# Patient Record
Sex: Female | Born: 1955 | Race: Black or African American | Hispanic: No | Marital: Single | Smoking: Never smoker
Health system: Southern US, Community
[De-identification: ages and names within clinical notes are randomized; demographics above are authoritative.]

## PROBLEM LIST (undated history)

## (undated) DIAGNOSIS — E785 Hyperlipidemia, unspecified: Secondary | ICD-10-CM

## (undated) DIAGNOSIS — I2510 Atherosclerotic heart disease of native coronary artery without angina pectoris: Secondary | ICD-10-CM

## (undated) DIAGNOSIS — Z923 Personal history of irradiation: Secondary | ICD-10-CM

## (undated) DIAGNOSIS — I639 Cerebral infarction, unspecified: Secondary | ICD-10-CM

## (undated) DIAGNOSIS — N950 Postmenopausal bleeding: Secondary | ICD-10-CM

## (undated) DIAGNOSIS — E119 Type 2 diabetes mellitus without complications: Secondary | ICD-10-CM

## (undated) DIAGNOSIS — Z955 Presence of coronary angioplasty implant and graft: Secondary | ICD-10-CM

## (undated) DIAGNOSIS — I10 Essential (primary) hypertension: Secondary | ICD-10-CM

## (undated) DIAGNOSIS — C50919 Malignant neoplasm of unspecified site of unspecified female breast: Secondary | ICD-10-CM

## (undated) HISTORY — DX: Type 2 diabetes mellitus without complications: E11.9

## (undated) HISTORY — DX: Hyperlipidemia, unspecified: E78.5

## (undated) HISTORY — DX: Personal history of irradiation: Z92.3

## (undated) HISTORY — DX: Essential (primary) hypertension: I10

## (undated) HISTORY — DX: Cerebral infarction, unspecified: I63.9

## (undated) HISTORY — PX: CORONARY ANGIOPLASTY WITH STENT PLACEMENT: SHX49

## (undated) HISTORY — PX: COLONOSCOPY: SHX174

---

## 1992-11-29 HISTORY — PX: DILATION AND CURETTAGE OF UTERUS: SHX78

## 2001-04-20 ENCOUNTER — Other Ambulatory Visit: Admission: RE | Admit: 2001-04-20 | Discharge: 2001-04-20 | Payer: Self-pay | Admitting: Obstetrics and Gynecology

## 2003-05-06 ENCOUNTER — Encounter: Payer: Self-pay | Admitting: Internal Medicine

## 2003-05-06 ENCOUNTER — Encounter: Admission: RE | Admit: 2003-05-06 | Discharge: 2003-05-06 | Payer: Self-pay | Admitting: Internal Medicine

## 2005-11-23 ENCOUNTER — Encounter: Admission: RE | Admit: 2005-11-23 | Discharge: 2005-11-23 | Payer: Self-pay | Admitting: Internal Medicine

## 2013-06-02 ENCOUNTER — Ambulatory Visit (INDEPENDENT_AMBULATORY_CARE_PROVIDER_SITE_OTHER): Payer: BC Managed Care – HMO | Admitting: Family Medicine

## 2013-06-02 ENCOUNTER — Ambulatory Visit: Payer: BC Managed Care – HMO

## 2013-06-02 DIAGNOSIS — M79609 Pain in unspecified limb: Secondary | ICD-10-CM

## 2013-06-02 DIAGNOSIS — S63619A Unspecified sprain of unspecified finger, initial encounter: Secondary | ICD-10-CM

## 2013-06-02 DIAGNOSIS — I10 Essential (primary) hypertension: Secondary | ICD-10-CM | POA: Insufficient documentation

## 2013-06-02 DIAGNOSIS — S6390XA Sprain of unspecified part of unspecified wrist and hand, initial encounter: Secondary | ICD-10-CM

## 2013-06-02 NOTE — Progress Notes (Signed)
Patient ID: Audrey Fischer MRN: [id number], DOB: [birth year], 57 y.o. Date of Encounter: 06/02/2013, 7:04 PM  Primary Physician: Nadean Corwin, MD  Chief Complaint: Jammed right 5th finger today  HPI: 57 y.o. female with history below presents with right 5th digit pain. Patient was trying to get a box of plastic silverware out of the pantry and it started to fall. Upon trying to catch this she jammed her 5th digit into the cardboard box. Her daughter thinking it was jammed tried to relocate the digit. She comes in with pain at the DIP. No STS, erythema, or bruising. She is right handed.    Past Medical History  Diagnosis Date  . Heart disease   . Hypertension   . Hyperlipidemia      Home Meds: Prior to Admission medications   Medication Sig Start Date End Date Taking? Authorizing Provider  atenolol (TENORMIN) 50 MG tablet Take 50 mg by mouth daily.   Yes Historical Provider, MD  rosuvastatin (CRESTOR) 5 MG tablet Take 5 mg by mouth daily.   Yes Historical Provider, MD    Allergies: No Known Allergies  History   Social History  . Marital Status: Single    Spouse Name: N/A    Number of Children: N/A  . Years of Education: N/A   Occupational History  . Not on file.   Social History Main Topics  . Smoking status: Never Smoker   . Smokeless tobacco: Not on file  . Alcohol Use: No  . Drug Use: No  . Sexually Active: No   Other Topics Concern  . Not on file   Social History Narrative  . No narrative on file     Review of Systems: Constitutional: negative for chills, fever, or fatigue  Cardiovascular: negative for chest pain or palpitations Respiratory: negative for hemoptysis, wheezing, shortness of breath, or cough Dermatological: negative for rash Musculoskeletal: see above Neurologic: negative for headache   Physical Exam: Blood pressure 166/104, pulse 74, temperature 97.9 F (36.6 C), temperature source Oral, resp. rate 18, height 5' 8.5" (1.74 m),  weight 209 lb 12.8 oz (95.165 kg), SpO2 99.00%., Body mass index is 31.43 kg/(m^2). General: Well developed, well nourished, in no acute distress. Head: Normocephalic, atraumatic, eyes without discharge, sclera non-icteric, nares are without discharge.  Neck: Supple. Full ROM. Lungs: Clear bilaterally to auscultation without wheezes, rales, or rhonchi. Breathing is unlabored. Heart: RRR with S1 S2. No murmurs, rubs, or gallops appreciated. Msk:  Strength and tone normal for age. Upon entering the room the right 5th digit is splinted with masking tape and a green highlighter in full extension. This was removed for my initial exam. No TTP of the wrist or hand. No TTP of the MCP. No TTP of the proximal 5th digit. No TTP of the PIP. No TTP from the PIP distally to the DIP. Mild TTP of the DIP. FROM of the MCP joint and PIP joint. Unable to fully assess the DIP joint secondary to pain, although flexion is intact. No erythema, ecchymosis, or STS.  Extremities/Skin: Warm and dry. No clubbing or cyanosis. No edema. No rashes or suspicious lesions. Neuro: Alert and oriented X 3. Moves all extremities spontaneously. Gait is normal. CNII-XII grossly in tact. Psych:  Responds to questions appropriately with a normal affect.   [facility] reading (PRIMARY) by  Dr. Patsy Lager. Right 5th digit: Negative   ASSESSMENT AND PLAN:  57 y.o. female with right 5th digit finger sprain and hypertension  1) Right 5th digit  finger sprain -Metal fold over splint -Advance ROM as tolerated within the next 5 days -Motrin prn  2) Hypertension -Usually well controlled -She has not yet taken her antihypertensive medication today, she is also in considerable pain and stress from her injury accounting for the elevation in her blood pressure. I have advised her go home and take her medication and check her blood pressure with her cuff shortly after over the next several days. She already has a follow up appointment with her PCP. If her  BP remains elevated, call their office and see if she can move this up sooner.  -RTC/ER precautions   Signed, Eula Listen, PA-C 06/02/2013 7:04 PM

## 2013-11-28 ENCOUNTER — Other Ambulatory Visit: Payer: Self-pay | Admitting: Internal Medicine

## 2013-12-04 NOTE — Telephone Encounter (Signed)
TRY TO CALL PT TODAY=12/04/2013 AT [phone]=LEFT MESSAGE FOR PT TO CALL THE OFFICE TO MAKE AN APPT=ALSO CALLED [phone]-NA= AND [phone] NO RING TONE.    ALSO CALLED [facility] AT [address] AT [phone] TOLD CRYSTAL THAT IF PT CALLS SHE NEEDS AND OV 1ST BEFORE ANY REFILLS.JSI

## 2013-12-25 NOTE — Telephone Encounter (Signed)
Pt never responded? At this point do we close pt's chart?   SB

## 2015-05-26 DIAGNOSIS — E7800 Pure hypercholesterolemia, unspecified: Secondary | ICD-10-CM | POA: Insufficient documentation

## 2015-07-02 ENCOUNTER — Encounter: Payer: Self-pay | Admitting: Gastroenterology

## 2015-07-02 ENCOUNTER — Other Ambulatory Visit: Payer: Self-pay | Admitting: Obstetrics and Gynecology

## 2015-07-02 DIAGNOSIS — N6310 Unspecified lump in the right breast, unspecified quadrant: Secondary | ICD-10-CM

## 2015-07-02 DIAGNOSIS — N6320 Unspecified lump in the left breast, unspecified quadrant: Secondary | ICD-10-CM

## 2015-07-07 ENCOUNTER — Other Ambulatory Visit: Payer: Self-pay | Admitting: Obstetrics and Gynecology

## 2015-07-07 ENCOUNTER — Ambulatory Visit
Admission: RE | Admit: 2015-07-07 | Discharge: 2015-07-07 | Disposition: A | Discharge status: Home / Self Care | Payer: BC Managed Care – PPO | Source: Ambulatory Visit | Attending: Obstetrics and Gynecology | Admitting: Obstetrics and Gynecology

## 2015-07-07 ENCOUNTER — Ambulatory Visit
Admission: RE | Admit: 2015-07-07 | Discharge: 2015-07-07 | Disposition: A | Discharge status: Home / Self Care | Payer: Self-pay | Source: Ambulatory Visit | Attending: Obstetrics and Gynecology | Admitting: Obstetrics and Gynecology

## 2015-07-07 DIAGNOSIS — N6320 Unspecified lump in the left breast, unspecified quadrant: Secondary | ICD-10-CM

## 2015-07-07 DIAGNOSIS — N6310 Unspecified lump in the right breast, unspecified quadrant: Secondary | ICD-10-CM

## 2015-07-08 ENCOUNTER — Other Ambulatory Visit: Payer: Self-pay | Admitting: Obstetrics and Gynecology

## 2015-07-08 DIAGNOSIS — N6320 Unspecified lump in the left breast, unspecified quadrant: Secondary | ICD-10-CM

## 2015-07-09 ENCOUNTER — Ambulatory Visit
Admission: RE | Admit: 2015-07-09 | Discharge: 2015-07-09 | Disposition: A | Discharge status: Home / Self Care | Payer: BC Managed Care – PPO | Source: Ambulatory Visit | Attending: Obstetrics and Gynecology | Admitting: Obstetrics and Gynecology

## 2015-07-09 DIAGNOSIS — N6320 Unspecified lump in the left breast, unspecified quadrant: Secondary | ICD-10-CM

## 2015-07-09 DIAGNOSIS — C50919 Malignant neoplasm of unspecified site of unspecified female breast: Secondary | ICD-10-CM

## 2015-07-09 HISTORY — DX: Malignant neoplasm of unspecified site of unspecified female breast: C50.919

## 2015-07-17 ENCOUNTER — Telehealth: Payer: Self-pay | Admitting: *Deleted

## 2015-07-17 NOTE — Telephone Encounter (Signed)
Received referral from [facility].  Called and left a message for the pt to return my call so I can schedule a med onc appt w/ her.  

## 2015-07-18 ENCOUNTER — Encounter: Payer: Self-pay | Admitting: Internal Medicine

## 2015-07-18 ENCOUNTER — Ambulatory Visit (AMBULATORY_SURGERY_CENTER): Payer: Self-pay

## 2015-07-18 VITALS — Ht 68.5 in | Wt 201.6 lb

## 2015-07-18 DIAGNOSIS — Z800 Family history of malignant neoplasm of digestive organs: Secondary | ICD-10-CM

## 2015-07-18 MED ORDER — SUPREP BOWEL PREP KIT 17.5-3.13-1.6 GM/177ML PO SOLN: 1.0000 | Freq: Once | ORAL | Status: DC

## 2015-07-18 NOTE — Progress Notes (Signed)
No allergies to eggs or soy No home oxygen No diet/weight loss meds No past problems with anesthesia EXCEPT VERY SENSITIVE  Has email  Emmi instructions given for colonscopy

## 2015-07-22 ENCOUNTER — Encounter: Payer: Self-pay | Admitting: Radiation Oncology

## 2015-07-22 ENCOUNTER — Telehealth: Payer: Self-pay | Admitting: *Deleted

## 2015-07-22 NOTE — Progress Notes (Addendum)
Location of Breast Cancer:Left Breast  7   0'clock  Position [place]  Histology per Pathology Report: Diagnosis 07/09/15: Breast, left, needle core biopsy, 7:00 o'clock subareolar - INVASIVE DUCTAL CARCINOMA.  Receptor Status: ER(+100%)  PR (+100%), Her2-neu ( neg, KI-67 10%)  Did patient present with symptoms (if so, please note symptoms) or was this found on screening mammography?: Routine mammogram screening  Past/Anticipated interventions by surgeon, if any: Dr. Daiva Nakayama ,MD Colonoscopy scheduled 07/28/15  With [name]  07/16/15 had U/S vaginal done by Dr. Leo Grosser, found cyst on her ovary  U/S Past/Anticipated interventions by medical oncology, if any: Chemotherapy none as yet,patient will call and schedule she started back to work   Lymphedema issues, if any:  NO  Pain issues, if any:  NO  SAFETY ISSUES:NO  Prior radiation? NO  Pacemaker/ICD? NO  Possible current pregnancy? NO  Is the patient on methotrexate? NO  Current Complaints / other details:  Single , menarche age 13G3P2,oral contraceptives, No HRT HX cardiac stent 24-Feb-2008  ,Mother deceased had colon cancer ,stroke,heart disease, HTN,, Father deceased heart disease  HTN Never smoker,occsional alcohol use,no drug use  Audrey Fischer, Audrey Gage, RN 07/22/2015,9:18 AM

## 2015-07-22 NOTE — Telephone Encounter (Signed)
Called pt at home and got no answer no machine.  Called & left a message on pt's cell to return my call so I can schedule a med onc appt w/ her.

## 2015-07-23 ENCOUNTER — Encounter: Payer: Self-pay | Admitting: Radiation Oncology

## 2015-07-23 ENCOUNTER — Ambulatory Visit
Admission: RE | Admit: 2015-07-23 | Discharge: 2015-07-23 | Disposition: A | Discharge status: Home / Self Care | Payer: BC Managed Care – PPO | Source: Ambulatory Visit | Attending: Radiation Oncology | Admitting: Radiation Oncology

## 2015-07-23 VITALS — BP 183/103 | HR 107 | Temp 97.8°F | Resp 20 | Ht 68.5 in | Wt 205.5 lb

## 2015-07-23 DIAGNOSIS — C50019 Malignant neoplasm of nipple and areola, unspecified female breast: Secondary | ICD-10-CM | POA: Diagnosis not present

## 2015-07-23 DIAGNOSIS — Z955 Presence of coronary angioplasty implant and graft: Secondary | ICD-10-CM | POA: Diagnosis not present

## 2015-07-23 DIAGNOSIS — C50112 Malignant neoplasm of central portion of left female breast: Secondary | ICD-10-CM | POA: Insufficient documentation

## 2015-07-23 DIAGNOSIS — Z79899 Other long term (current) drug therapy: Secondary | ICD-10-CM | POA: Diagnosis not present

## 2015-07-23 DIAGNOSIS — C50012 Malignant neoplasm of nipple and areola, left female breast: Secondary | ICD-10-CM

## 2015-07-23 DIAGNOSIS — Z7982 Long term (current) use of aspirin: Secondary | ICD-10-CM | POA: Diagnosis not present

## 2015-07-23 DIAGNOSIS — I10 Essential (primary) hypertension: Secondary | ICD-10-CM | POA: Insufficient documentation

## 2015-07-23 DIAGNOSIS — E785 Hyperlipidemia, unspecified: Secondary | ICD-10-CM | POA: Insufficient documentation

## 2015-07-23 HISTORY — DX: Malignant neoplasm of unspecified site of unspecified female breast: C50.919

## 2015-07-23 NOTE — Progress Notes (Signed)
Please see the Nurse Progress Note in the MD Initial Consult Encounter for this patient. 

## 2015-07-23 NOTE — Progress Notes (Signed)
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 A diagnosis of Cancer of central portion of left breast was also pertinent to this visit. 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 female who is seen for an initial consultation visit. We are asked to see the patient in consultation by Dr. Marlou Starks to evaluate her for a left breast cancer. The patient is a 59 yo black female who recently went for a routine screening mammogram. At that time she was found to have a 1.3 cm mass in the retroareolar position. This was biopsied and came back as an invasive ductal cancer. She was ER and PR positive and HER-2 negative with a Ki-67 of 10%. She denies any personal or family history of breast cancer. She does not take any hormone replacement. She does have a history of cardiac stents placed in 2009. She is only on a baby aspirin.   She has a colonoscopy scheduled for Monday, 8/29, with Zenovia Jarred. She is not currently scheduled to follow up with medical oncology but plans to do so soon.   PREVIOUS RADIATION THERAPY: No   PAST MEDICAL HISTORY:  has a past medical history of Heart disease; Hypertension; Hyperlipidemia; Breast cancer (07/09/15); and Allergy.     PAST SURGICAL HISTORY: Past Surgical History  Procedure Laterality Date  . Carotid stent  2009    x2 stents at this encounter  . Cesarean section    . Ovum / oocyte retrieval       FAMILY HISTORY: family history includes Colon cancer (age of onset: 64) in her mother; Heart disease in her father and mother; Stroke in her mother.   SOCIAL HISTORY:  reports that she has  never smoked. She has never used smokeless tobacco. She reports that she drinks alcohol. She reports that she does not use illicit drugs.   ALLERGIES: Capsule   MEDICATIONS:  Current Outpatient Prescriptions  Medication Sig Dispense Refill  . aspirin 81 MG tablet Take 81 mg by mouth daily.    Marland Kitchen atenolol (TENORMIN) 50 MG tablet Take 50 mg by mouth daily.    . rosuvastatin (CRESTOR) 5 MG tablet Take 5 mg by mouth daily.    . nitroGLYCERIN (NITROSTAT) 0.4 MG SL tablet Place 0.4 mg under the tongue.    Marland Kitchen SUPREP BOWEL PREP SOLN Take 1 kit by mouth once. (Patient not taking: Reported on 07/23/2015) 354 mL 0   No current facility-administered medications for this encounter.     REVIEW OF SYSTEMS:  A 15 point review of systems is documented in the electronic medical record. This was obtained by the nursing staff. However, I reviewed this with the patient to discuss relevant findings and make appropriate changes.  Pertinent items are noted in HPI.    PHYSICAL EXAM:  height is 5' 8.5" (1.74 m) and weight is 205 lb 8 oz (93.214 kg). Her oral temperature is 97.8 F (36.6 C). Her blood pressure is 183/103 and her pulse is 107. Her respiration is 20.   General: Well-developed, in no acute distress HEENT: Normocephalic, atraumatic; oral cavity clear Neck: Supple without any  lymphadenopathy Cardiovascular: Regular rate and rhythm Respiratory: Clear to auscultation bilaterally GI: Soft, nontender, normal bowel sounds Extremities: No edema present Neuro: No focal deficits  Small biopsy site laterally within the left breast. Small palpable mass in the retroareolar region on the left without any axillary lymph nodes. Unremarkable right breast exam without any axillary lymph nodes. ECOG = 0  0 - Asymptomatic (Fully active, able to carry on all predisease activities without restriction)  1 - Symptomatic but completely ambulatory (Restricted in physically strenuous activity but ambulatory and able to  carry out work of a light or sedentary nature. For example, light housework, office work)  2 - Symptomatic, <50% in bed during the day (Ambulatory and capable of all self care but unable to carry out any work activities. Up and about more than 50% of waking hours)  3 - Symptomatic, >50% in bed, but not bedbound (Capable of only limited self-care, confined to bed or chair 50% or more of waking hours)  4 - Bedbound (Completely disabled. Cannot carry on any self-care. Totally confined to bed or chair)  5 - Death   Eustace Pen MM, Creech RH, Tormey DC, et al. [phone]). "Toxicity and response criteria of the [facility] Group". [place] Oncol. 5 (6): 649-55     LABORATORY DATA:  No results found for: WBC, HGB, HCT, MCV, PLT No results found for: NA, K, CL, CO2 No results found for: ALT, AST, GGT, ALKPHOS, BILITOT    RADIOGRAPHY: Mm Digital Diagnostic Unilat L  07/09/2015   CLINICAL DATA:  Status post ultrasound-guided core biopsy of the left breast mass.  EXAM: DIAGNOSTIC LEFT MAMMOGRAM POST ULTRASOUND BIOPSY  COMPARISON:  Previous exam(s).  FINDINGS: Mammographic images were obtained following ultrasound guided biopsy of a mass in the 7 o'clock subareolar region of the left breast. Mammographic images show there is a ribbon shaped clip associated with the mass in the 7 o'clock subareolar region of the left breast.  IMPRESSION: Status post ultrasound-guided core biopsy of the left breast with pathology pending.  Final Assessment: Post Procedure Mammograms for Marker Placement   Electronically Signed   By: Lillia Mountain M.D.   On: 07/09/2015 11:16   US Breast Ltd [name] Left Inc Axilla  07/16/2015   ADDENDUM REPORT: 07/10/2015 14:01  ADDENDUM: Pathology reveals Grade I invasive ductal carcinoma of the left breast. This was found to be concordant by Dr. Lillia Mountain. Pathology results were discussed with the patient via telephone. The patient reported no problems with the biopsy site and is  doing well. Post biopsy instructions were reviewed and questions were answered. The patient was encouraged to call [facility] with any additional questions and or concerns. The patient was referred to Dr. Autumn Messing of [facility] on July 16, 2015.  Pathology results reported by Terie Purser RN on July 10, 2015.   Electronically Signed   By: Lillia Mountain M.D.   On: 07/10/2015 14:01   07/16/2015   CLINICAL DATA:  Mass in each breast on a recent screening mammogram at [facility].  EXAM: DIGITAL DIAGNOSTIC BILATERAL MAMMOGRAM WITH 3D TOMOSYNTHESIS WITH CAD  ULTRASOUND BILATERAL BREAST  COMPARISON:  Previous examinations, including the screening mammogram at [facility] on 07/02/2015 and previous mammogram at the [facility] on 11/23/2005.  ACR Breast Density Category c: The breast tissue is heterogeneously dense, which may obscure small masses.  FINDINGS: Spot tomographic images of the right breast confirm  a small, rounded, circumscribed mass with some associated fat density in the anterior aspect of the lower outer quadrant of the breast.  Spot tomographic images of the left breast confirm an oval, irregular, spiculated mass in the lower inner retroareolar region.  Mammographic images were processed with CAD.  On physical exam, no mass is palpable in the inferior right breast or left axilla. There is an approximately 1.2 cm oval, firm, palpable mass in the lower inner retroareolar left breast, in the 7 o'clock position, immediately adjacent to the nipple.  Targeted ultrasound is performed, showing three small, oval, circumscribed, hypoechoic masses containing some echogenic material in the 7 o'clock periareolar region of the right breast, 1 cm from the nipple. The ultrasound images are incorrectly labeled 5 o'clock. The largest measures 4 mm in maximum diameter and contains a thin internal septation.  Ultrasound of the left  breast demonstrates a 1.3 x 1.3 x 1.0 cm oval, horizontally oriented, hypoechoic mass with irregular margins in the 7 o'clock retroareolar region. This is extending into the overlying skin of the areola and into the inferior aspect of the nipple.  Ultrasound of the left axilla demonstrated normal appearing left axillary lymph nodes.  IMPRESSION: 1. 1.3 cm mass with imaging features highly suspicious for malignancy in the 7 o'clock retroareolar left breast, involving the areola and adjacent nipple. 2. Oil cysts 7 o'clock position of the right breast.  RECOMMENDATION: Ultrasound-guided core needle biopsy of the 1.3 cm 7 o'clock retroareolar left breast mass. This has been discussed with the patient and scheduled at 9 a.m. on 07/09/2015.  I have discussed the findings and recommendations with the patient. Results were also provided in writing at the conclusion of the visit. If applicable, a reminder letter will be sent to the patient regarding the next appointment.  BI-RADS CATEGORY  5: Highly suggestive of malignancy.  Electronically Signed: By: Claudie Revering M.D. 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 This was found to be concordant by Dr. Lillia Mountain. Pathology results were discussed with the patient via telephone. The patient reported no problems with the biopsy site and is doing well. Post biopsy instructions were reviewed and questions were answered. The patient was encouraged to call [facility] with any additional questions and or concerns. The patient was referred to Dr. Autumn Messing of [facility] on July 16, 2015.  Pathology results reported by Terie Purser RN on July 10, 2015.   Electronically Signed   By: Lillia Mountain M.D.   On: 07/10/2015 14:01   07/16/2015   CLINICAL DATA:  Mass in each breast on a recent screening  mammogram at [facility].  EXAM: DIGITAL DIAGNOSTIC BILATERAL MAMMOGRAM WITH 3D TOMOSYNTHESIS WITH CAD  ULTRASOUND BILATERAL BREAST  COMPARISON:  Previous examinations, including the screening mammogram at [facility] on 07/02/2015 and previous mammogram at the [place] on 11/23/2005.  ACR Breast Density Category c: The breast tissue is heterogeneously dense, which may obscure small masses.  FINDINGS: Spot tomographic images of the right breast confirm a small, rounded, circumscribed mass with some associated fat density in the anterior aspect of the lower outer quadrant of the breast.  Spot tomographic images of the left breast confirm an oval, irregular, spiculated mass in the lower inner retroareolar region.  Mammographic images were  processed with CAD.  On physical exam, no mass is palpable in the inferior right breast or left axilla. There is an approximately 1.2 cm oval, firm, palpable mass in the lower inner retroareolar left breast, in the 7 o'clock position, immediately adjacent to the nipple.  Targeted ultrasound is performed, showing three small, oval, circumscribed, hypoechoic masses containing some echogenic material in the 7 o'clock periareolar region of the right breast, 1 cm from the nipple. The ultrasound images are incorrectly labeled 5 o'clock. The largest measures 4 mm in maximum diameter and contains a thin internal septation.  Ultrasound of the left breast demonstrates a 1.3 x 1.3 x 1.0 cm oval, horizontally oriented, hypoechoic mass with irregular margins in the 7 o'clock retroareolar region. This is extending into the overlying skin of the areola and into the inferior aspect of the nipple.  Ultrasound of the left axilla demonstrated normal appearing left axillary lymph nodes.  IMPRESSION: 1. 1.3 cm mass with imaging features highly suspicious for malignancy in the 7 o'clock retroareolar left breast, involving the areola and adjacent nipple. 2.  Oil cysts 7 o'clock position of the right breast.  RECOMMENDATION: Ultrasound-guided core needle biopsy of the 1.3 cm 7 o'clock retroareolar left breast mass. This has been discussed with the patient and scheduled at 9 a.m. on 07/09/2015.  I have discussed the findings and recommendations with the patient. Results were also provided in writing at the conclusion of the visit. If applicable, a reminder letter will be sent to the patient regarding the next appointment.  BI-RADS CATEGORY  5: Highly suggestive of malignancy.  Electronically Signed: By: Claudie Revering M.D. On: 07/07/2015 13:09   Mm Diag Breast Tomo Bilateral  07/16/2015   ADDENDUM REPORT: 07/10/2015 14:01  ADDENDUM: Pathology reveals Grade I invasive ductal carcinoma of the left breast. This was found to be concordant by Dr. Lillia Mountain. Pathology results were discussed with the patient via telephone. The patient reported no problems with the biopsy site and is doing well. Post biopsy instructions were reviewed and questions were answered. The patient was encouraged to call [facility] with any additional questions and or concerns. The patient was referred to Dr. Autumn Messing of [facility] on July 16, 2015.  Pathology results reported by Terie Purser RN on July 10, 2015.   Electronically Signed   By: Lillia Mountain M.D.   On: 07/10/2015 14:01   07/16/2015   CLINICAL DATA:  Mass in each breast on a recent screening mammogram at [facility].  EXAM: DIGITAL DIAGNOSTIC BILATERAL MAMMOGRAM WITH 3D TOMOSYNTHESIS WITH CAD  ULTRASOUND BILATERAL BREAST  COMPARISON:  Previous examinations, including the screening mammogram at [facility] on 07/02/2015 and previous mammogram at the [facility] on 11/23/2005.  ACR Breast Density Category c: The breast tissue is heterogeneously dense, which may obscure small masses.  FINDINGS: Spot tomographic images of the right breast confirm a  small, rounded, circumscribed mass with some associated fat density in the anterior aspect of the lower outer quadrant of the breast.  Spot tomographic images of the left breast confirm an oval, irregular, spiculated mass in the lower inner retroareolar region.  Mammographic images were processed with CAD.  On physical exam, no mass is palpable in the inferior right breast or left axilla. There is an approximately 1.2 cm oval, firm, palpable mass in the lower inner retroareolar left breast, in the 7 o'clock position, immediately adjacent to the nipple.  Targeted  ultrasound is performed, showing three small, oval, circumscribed, hypoechoic masses containing some echogenic material in the 7 o'clock periareolar region of the right breast, 1 cm from the nipple. The ultrasound images are incorrectly labeled 5 o'clock. The largest measures 4 mm in maximum diameter and contains a thin internal septation.  Ultrasound of the left breast demonstrates a 1.3 x 1.3 x 1.0 cm oval, horizontally oriented, hypoechoic mass with irregular margins in the 7 o'clock retroareolar region. This is extending into the overlying skin of the areola and into the inferior aspect of the nipple.  Ultrasound of the left axilla demonstrated normal appearing left axillary lymph nodes.  IMPRESSION: 1. 1.3 cm mass with imaging features highly suspicious for malignancy in the 7 o'clock retroareolar left breast, involving the areola and adjacent nipple. 2. Oil cysts 7 o'clock position of the right breast.  RECOMMENDATION: Ultrasound-guided core needle biopsy of the 1.3 cm 7 o'clock retroareolar left breast mass. This has been discussed with the patient and scheduled at 9 a.m. on 07/09/2015.  I have discussed the findings and recommendations with the patient. Results were also provided in writing at the conclusion of the visit. If applicable, a reminder letter will be sent to the patient regarding the next appointment.  BI-RADS CATEGORY  5: Highly  suggestive of malignancy.  Electronically Signed: By: Claudie Revering M.D. On: 07/07/2015 13:09   [place] Lt Breast Bx W Loc Dev 1st Lesion Img Bx Spec US Guide  07/09/2015   CLINICAL DATA:  Suspicious left breast mass.  EXAM: ULTRASOUND GUIDED LEFT BREAST CORE NEEDLE BIOPSY  COMPARISON:  Previous exam(s).  PROCEDURE: I met with the patient and we discussed the procedure of ultrasound-guided biopsy, including benefits and alternatives. We discussed the high likelihood of a successful procedure. We discussed the risks of the procedure including infection, bleeding, tissue injury, clip migration, and inadequate sampling. Informed written consent was given. The usual time-out protocol was performed immediately prior to the procedure.  Using sterile technique and 2% Lidocaine as local anesthetic, under direct ultrasound visualization, a 12 gauge vacuum-assisted device was used to perform biopsy of a mass in the 7 o'clock subareolar region of the left breastusing a lateral to medial approach. At the conclusion of the procedure, a ribbon shaped tissue marker clip was deployed into the biopsy cavity. Follow-up 2-view mammogram was performed and dictated separately.  IMPRESSION: Ultrasound-guided biopsy of the left breast. No apparent complications.   Electronically Signed   By: Lillia Mountain M.D.   On: 07/09/2015 10:50       IMPRESSION/PLAN: Patient will proceed with colonoscopy next Monday and will likely proceed with lumpectomy with Dr. Marlou Starks in the near future. Patient appears to have a Stage 1 left sided breast cancer based on current information.  Patient will follow up in our clinic after surgery. At that time, we will review her results and further discuss treatment. Medical oncology appointment pending.  I spent 50 minutes face to face with the patient and more than 50% of that time was spent in counseling and/or coordination of care.   This document serves as a record of services personally performed by Kyung Rudd, MD. It was created on his behalf by Arlyce Harman, a trained medical scribe. The creation of this record is based on the scribe's personal observations and the provider's statements to them. This document has been checked and approved by the attending provider. ________________________________   Jodelle Gross, MD, PhD   **Disclaimer: This note was dictated  with voice recognition software. Similar sounding words can inadvertently be transcribed and this note may contain transcription errors which may not have been corrected upon publication of note.**

## 2015-07-24 ENCOUNTER — Telehealth: Payer: Self-pay | Admitting: *Deleted

## 2015-07-24 NOTE — Telephone Encounter (Signed)
Pt left me a message for me to call her on her cell or at work.  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  Called cell and left her a message to return my call.

## 2015-07-25 ENCOUNTER — Telehealth: Payer: Self-pay | Admitting: *Deleted

## 2015-07-25 NOTE — Telephone Encounter (Signed)
Pt returned my call and I confirmed 07/30/15 med onc appt w/ pt.  Unable to mail before appt letter - gave verbal.  Unable to mail welcoming packet - gave directions and instructions.  Unable to mail intake form - placed a note for one to be given at time of check in.  Emailed Shavon at [facility] to make her aware.  Placed a copy of the records in Dr. Virgie Dad box and took one to HIM to scan.

## 2015-07-28 ENCOUNTER — Encounter: Payer: Self-pay | Admitting: Internal Medicine

## 2015-07-28 ENCOUNTER — Telehealth: Payer: Self-pay | Admitting: *Deleted

## 2015-07-28 ENCOUNTER — Ambulatory Visit (AMBULATORY_SURGERY_CENTER): Payer: BC Managed Care – PPO | Admitting: Internal Medicine

## 2015-07-28 VITALS — BP 165/95 | HR 65 | Temp 97.7°F | Resp 22 | Ht 68.5 in | Wt 201.0 lb

## 2015-07-28 DIAGNOSIS — Z800 Family history of malignant neoplasm of digestive organs: Secondary | ICD-10-CM

## 2015-07-28 DIAGNOSIS — Z1211 Encounter for screening for malignant neoplasm of colon: Secondary | ICD-10-CM

## 2015-07-28 DIAGNOSIS — D125 Benign neoplasm of sigmoid colon: Secondary | ICD-10-CM

## 2015-07-28 DIAGNOSIS — Z7689 Persons encountering health services in other specified circumstances: Secondary | ICD-10-CM

## 2015-07-28 MED ORDER — SODIUM CHLORIDE 0.9 % IV SOLN: 500.0000 mL | INTRAVENOUS | Status: DC

## 2015-07-28 NOTE — Op Note (Signed)
[facility]  [address] [place], [id number]   COLONOSCOPY PROCEDURE REPORT  PATIENT: Audrey Fischer, Audrey Fischer  MR#: [id number] BIRTHDATE: 11/01/1956 , 58  yrs. old GENDER: female ENDOSCOPIST: Jerene Bears, MD PROCEDURE DATE:  07/28/2015 PROCEDURE:   Colonoscopy, screening and Colonoscopy with snare polypectomy First Screening Colonoscopy - Avg.  risk and is 50 yrs.  old or older Yes.  Prior Negative Screening - Now for repeat screening. N/A  History of Adenoma - Now for follow-up colonoscopy & has been > or = to 3 yrs.  N/A  Polyps removed today? Yes ASA CLASS:   Class II INDICATIONS:Screening for colonic neoplasia and family history of colon cancer in mother. MEDICATIONS: Monitored anesthesia care and Propofol 220 mg IV  DESCRIPTION OF PROCEDURE:   After the risks benefits and alternatives of the procedure were thoroughly explained, informed consent was obtained.  The digital rectal exam revealed no rectal mass.   The LB [id number]  endoscope was introduced through the anus and advanced to the cecum, which was identified by both the appendix and ileocecal valve. No adverse events experienced. The quality of the prep was good.  (Suprep was used)  The instrument was then slowly withdrawn as the colon was fully examined. Estimated blood loss is zero unless otherwise noted in this procedure report.    COLON FINDINGS: A sessile polyp measuring 5 mm in size was found in the sigmoid colon.  A polypectomy was performed with a cold snare. The resection was complete, the polyp tissue was completely retrieved and sent to histology.   The examination was otherwise normal.  Retroflexed views revealed internal hemorrhoids. The time to cecum = 3.5 Withdrawal time = 11.1   The scope was withdrawn and the procedure completed.  COMPLICATIONS: There were no immediate complications.  ENDOSCOPIC IMPRESSION: 1.   Sessile polyp was found in the sigmoid colon; polypectomy  was performed with a cold snare 2.   The examination was otherwise normal  RECOMMENDATIONS: 1.  Await pathology results 2.  Repeat Colonoscopy in 5 years given family history of colon cancer. 3.  You will receive a letter within 1-2 weeks with the results of your biopsy as well as final recommendations.  Please call my office if you have not received a letter after 3 weeks.  eSigned:  Jerene Bears, MD 07/28/2015 10:53 AM   cc:  the patient

## 2015-07-28 NOTE — Telephone Encounter (Signed)
Patient has been scheduled with Dr Vertell Novak on 08/26/15 @ 1:00 pm. Patient has been advised and verbalizes understanding. Per primary care, they will send new patient paperwork in the mail to patient.

## 2015-07-28 NOTE — Progress Notes (Signed)
Called to room to assist during endoscopic procedure.  Patient ID and intended procedure confirmed with present staff. Received instructions for my participation in the procedure from the performing physician.  

## 2015-07-28 NOTE — Progress Notes (Signed)
Report to PACU, RN, vss, BBS= Clear.  

## 2015-07-28 NOTE — Patient Instructions (Signed)
Discharge instructions given. Handout on polyps. Resume previous medications. YOU HAD AN ENDOSCOPIC PROCEDURE TODAY AT THE [facility]:   Refer to the procedure report that was given to you for any specific questions about what was found during the examination.  If the procedure report does not answer your questions, please call your gastroenterologist to clarify.  If you requested that your care partner not be given the details of your procedure findings, then the procedure report has been included in a sealed envelope for you to review at your convenience later.  YOU SHOULD EXPECT: Some feelings of bloating in the abdomen. Passage of more gas than usual.  Walking can help get rid of the air that was put into your GI tract during the procedure and reduce the bloating. If you had a lower endoscopy (such as a colonoscopy or flexible sigmoidoscopy) you may notice spotting of blood in your stool or on the toilet paper. If you underwent a bowel prep for your procedure, you may not have a normal bowel movement for a few days.  Please Note:  You might notice some irritation and congestion in your nose or some drainage.  This is from the oxygen used during your procedure.  There is no need for concern and it should clear up in a day or so.  SYMPTOMS TO REPORT IMMEDIATELY:   Following lower endoscopy (colonoscopy or flexible sigmoidoscopy):  Excessive amounts of blood in the stool  Significant tenderness or worsening of abdominal pains  Swelling of the abdomen that is new, acute  Fever of 100F or higher   For urgent or emergent issues, a gastroenterologist can be reached at any hour by calling [phone].   DIET: Your first meal following the procedure should be a small meal and then it is ok to progress to your normal diet. Heavy or fried foods are harder to digest and may make you feel nauseous or bloated.  Likewise, meals heavy in dairy and vegetables can increase bloating.  Drink  plenty of fluids but you should avoid alcoholic beverages for 24 hours.  ACTIVITY:  You should plan to take it easy for the rest of today and you should NOT DRIVE or use heavy machinery until tomorrow (because of the sedation medicines used during the test).    FOLLOW UP: Our staff will call the number listed on your records the next business day following your procedure to check on you and address any questions or concerns that you may have regarding the information given to you following your procedure. If we do not reach you, we will leave a message.  However, if you are feeling well and you are not experiencing any problems, there is no need to return our call.  We will assume that you have returned to your regular daily activities without incident.  If any biopsies were taken you will be contacted by phone or by letter within the next 1-3 weeks.  Please call us at [phone] if you have not heard about the biopsies in 3 weeks.    SIGNATURES/CONFIDENTIALITY: You and/or your care partner have signed paperwork which will be entered into your electronic medical record.  These signatures attest to the fact that that the information above on your After Visit Summary has been reviewed and is understood.  Full responsibility of the confidentiality of this discharge information lies with you and/or your care-partner. 

## 2015-07-28 NOTE — Telephone Encounter (Signed)
-----   Message from Jerene Bears, MD sent at 07/28/2015 10:55 AM EDT ----- PCP referral  [facility] Thanks [facility]

## 2015-07-29 ENCOUNTER — Telehealth: Payer: Self-pay | Admitting: *Deleted

## 2015-07-29 NOTE — Telephone Encounter (Signed)
Left message that we called for f/u 

## 2015-07-30 ENCOUNTER — Encounter: Payer: Self-pay | Admitting: *Deleted

## 2015-07-30 ENCOUNTER — Other Ambulatory Visit: Payer: Self-pay | Admitting: *Deleted

## 2015-07-30 ENCOUNTER — Ambulatory Visit: Payer: BC Managed Care – PPO

## 2015-07-30 ENCOUNTER — Other Ambulatory Visit (HOSPITAL_BASED_OUTPATIENT_CLINIC_OR_DEPARTMENT_OTHER): Payer: BC Managed Care – PPO

## 2015-07-30 ENCOUNTER — Ambulatory Visit (HOSPITAL_BASED_OUTPATIENT_CLINIC_OR_DEPARTMENT_OTHER): Payer: BC Managed Care – PPO | Admitting: Oncology

## 2015-07-30 VITALS — BP 159/79 | HR 66 | Temp 97.9°F | Resp 18 | Ht 68.5 in | Wt 203.0 lb

## 2015-07-30 DIAGNOSIS — C50312 Malignant neoplasm of lower-inner quadrant of left female breast: Secondary | ICD-10-CM

## 2015-07-30 DIAGNOSIS — C50112 Malignant neoplasm of central portion of left female breast: Secondary | ICD-10-CM | POA: Diagnosis not present

## 2015-07-30 DIAGNOSIS — Z170 Estrogen receptor positive status [ER+]: Secondary | ICD-10-CM

## 2015-07-30 DIAGNOSIS — C50319 Malignant neoplasm of lower-inner quadrant of unspecified female breast: Secondary | ICD-10-CM | POA: Insufficient documentation

## 2015-07-30 LAB — COMPREHENSIVE METABOLIC PANEL (CC13)
ALBUMIN: 4 g/dL (ref 3.5–5.0)
ALK PHOS: 71 U/L (ref 40–150)
ALT: 25 U/L (ref 0–55)
ANION GAP: 9 meq/L (ref 3–11)
AST: 16 U/L (ref 5–34)
BILIRUBIN TOTAL: 0.49 mg/dL (ref 0.20–1.20)
BUN: 11.2 mg/dL (ref 7.0–26.0)
CALCIUM: 9.7 mg/dL (ref 8.4–10.4)
CHLORIDE: 110 meq/L — AB (ref 98–109)
CO2: 23 mEq/L (ref 22–29)
CREATININE: 1.4 mg/dL — AB (ref 0.6–1.1)
EGFR: 47 mL/min/{1.73_m2} — ABNORMAL LOW (ref >90)
Glucose: 125 mg/dl (ref 70–140)
Potassium: 4 mEq/L (ref 3.5–5.1)
Sodium: 142 mEq/L (ref 136–145)
Total Protein: 7.3 g/dL (ref 6.4–8.3)

## 2015-07-30 LAB — CBC WITH DIFFERENTIAL/PLATELET
BASO%: 0.2 % (ref 0.0–2.0)
BASOS ABS: 0 10*3/uL (ref 0.0–0.1)
EOS%: 1.8 % (ref 0.0–7.0)
Eosinophils Absolute: 0.1 10*3/uL (ref 0.0–0.5)
HEMATOCRIT: 40.4 % (ref 34.8–46.6)
HGB: 14 g/dL (ref 11.6–15.9)
LYMPH#: 1.7 10*3/uL (ref 0.9–3.3)
LYMPH%: 30.1 % (ref 14.0–49.7)
MCH: 28.4 pg (ref 25.1–34.0)
MCHC: 34.7 g/dL (ref 31.5–36.0)
MCV: 81.9 fL (ref 79.5–101.0)
MONO#: 0.3 10*3/uL (ref 0.1–0.9)
MONO%: 5.1 % (ref 0.0–14.0)
NEUT#: 3.5 10*3/uL (ref 1.5–6.5)
NEUT%: 62.8 % (ref 38.4–76.8)
PLATELETS: 194 10*3/uL (ref 145–400)
RBC: 4.93 10*6/uL (ref 3.70–5.45)
RDW: 13.2 % (ref 11.2–14.5)
WBC: 5.5 10*3/uL (ref 3.9–10.3)

## 2015-07-30 NOTE — Progress Notes (Signed)
[facility]  [phone] [phone]     ID: Audrey Fischer DOB: 03-Jun-1956  MR#: [id number]  WPY#:[id number]  Patient Care Team: Audrey Millers, MD as PCP - General (Internal Medicine) Audrey Cruel, MD as Consulting Physician (Oncology) Audrey Messing III, MD as Consulting Physician (General Surgery) Audrey Rudd, MD as Consulting Physician (Radiation Oncology) Audrey Manges, MD as Consulting Physician (Obstetrics and Gynecology) Audrey Bears, MD as Consulting Physician (Gastroenterology) OTHER MD:  CHIEF COMPLAINT: estrogen receptor positive breast cancer  CURRENT TREATMENT: Awaiting definitive surgery   BREAST CANCER HISTORY: Audrey Fischer tells me after her husband's death in 01-20-06 she neglected her own health and did not see a doctor for several years. More recently she had a menstrual period after thinking she was in menopause. This took her to Dr. Leo Grosser who is in the process of investigating the bleeding problems. She also updated Audrey Fischer's health maintenance with mammography at [facility] obtained 07/02/2015. This showed a suspicious finding in the right breast and Audrey Fischer was referred to the breast Center where on 07/07/2015 she had bilateral diagnostic mammography with tomosynthesis and right breast ultrasonography. The breast density was category D.   In the left breast there was a small mass in the anterior aspect of the lower outer quadrant. By tomography this was irregular and spiculated. On physical exam it measured approximately 1.2 cm in the 7:00 position immediately adjacent to the nipple. Ultrasound found a 1.3 cm hypoechoic mass in the area in question. There were also 3 small oval hypoechoic masses containing echogenic material, the largest measuring 4 mm, with a thin internal septation. Ultrasound of the left axilla was unremarkable.   biopsy of the left breast mass in question 07/09/2015 showed ([id number]) and invasive ductal  carcinoma, grade 1, estrogen receptor 100% positive, progesterone receptor 100% positive, with an MIB-1 of 10%, and no HER-2 amplification, the signals ratio being 1.62 and the number per cell 2.10.  The patient's subsequent history is as detailed below  INTERVAL HISTORY:  Audrey Fischer was evaluated in the breast clinic 07/30/2015.   REVIEW OF SYSTEMS: There were no specific symptoms leading to the original mammogram, which was routinely scheduled. The patient denies unusual headaches, visual changes, nausea, vomiting, stiff neck, dizziness, or gait imbalance. There has been no cough, phlegm production, or pleurisy, no chest pain or pressure, and no change in bowel or bladder habits. The patient denies fever, rash, bleeding, unexplained fatigue or unexplained weight loss. The vaginal bleeding has not recurred. She exercises mostly by walking. She also goes to colds Audrey Fischer. A detailed review of systems was otherwise entirely negative.  PAST MEDICAL HISTORY: Past Medical History  Diagnosis Date  . Heart disease   . Hypertension   . Hyperlipidemia   . Breast cancer 07/09/15    left breast  . Allergy   . Cancer of lower-inner quadrant of female breast 07/30/2015    PAST SURGICAL HISTORY: Past Surgical History  Procedure Laterality Date  . Carotid stent  01-21-08    x2 stents at this encounter  . Cesarean section    . Ovum / oocyte retrieval      FAMILY HISTORY Family History  Problem Relation Age of Onset  . Heart disease Mother   . Stroke Mother   . Colon cancer Mother 83  . Heart disease Father   The patient's father died at the age of 57 from a myocardial infarction. The patient's mother died at the age of 57,  with a stroke. Both have significant diabetes problems. The patient has one brother, no sisters. On the mother's side 1 aunt had stomach cancer one cousin prostate cancer. The patient's own mother was diagnosed with colon cancer at age 45. There is no history of breast, ovarian, or uterine  cancer in the family to the patient's knowledge.   GYNECOLOGIC HISTORY:  No LMP recorded. Patient is postmenopausal.  menarche age 59. First live birth age 59. The patient is GX P2. She never took hormone replacement. She did use oral contraceptives remotely, without complication  SOCIAL HISTORY:  Audrey Fischer teaches middle school math. Her husband died from infectious complications following back surgery in 2007. Her children are Audrey Fischer who is a Equities trader at [employer] in [place] [employer], and Audrey Fischer, and freshman at [employer] with an eye to studying physical therapy the patient attends [place]: Not in place. At the initial clinic visit 07/30/2015 the patient was given the appropriate forms to complete and notarize at her discretion.   HEALTH MAINTENANCE: Social History  Substance Use Topics  . Smoking status: Never Smoker   . Smokeless tobacco: Never Used  . Alcohol Use: 0.0 oz/week    0 Standard drinks or equivalent per week     Comment: occasionally few times yearly     Colonoscopy:07/21/2015 / [facility]  PAP: UTD/ [facility]  Bone density:  Lipid panel:  Allergies  Allergen Reactions  . Capsule [Gelatin] Hives    Current Outpatient Prescriptions  Medication Sig Dispense Refill  . aspirin 81 MG tablet Take 81 mg by mouth daily.    Marland Kitchen atenolol (TENORMIN) 50 MG tablet Take 50 mg by mouth daily.    . nitroGLYCERIN (NITROSTAT) 0.4 MG SL tablet Place 0.4 mg under the tongue.    . rosuvastatin (CRESTOR) 10 MG tablet      No current facility-administered medications for this visit.    OBJECTIVE: middle-aged African-American woman who appears younger than stated age  85 Vitals:   07/30/15 1636  BP: 159/79  Pulse: 66  Temp: 97.9 F (36.6 C)  Resp: 18     Body mass index is 30.41 kg/(m^2).    ECOG FS:0 - Asymptomatic  Ocular: Sclerae unicteric, pupils equal, round and reactive to  light Ear-nose-throat: Oropharynx clear and moist Lymphatic: No cervical or supraclavicular adenopathy Lungs no rales or rhonchi, good excursion bilaterally Heart regular rate and rhythm, no murmur appreciated Abd soft, nontender, positive bowel sounds MSK no focal spinal tenderness, no joint edema Neuro: non-focal, well-oriented, appropriate affect Breasts: The right breast is unremarkable. In the left breast adjacent to the nipple there is a small mass, measuring approximately 1 cm, which is easily movable. There is no overlying erythema. There are no other suspicious findings. The left axilla is benign.   LAB RESULTS:  CMP     Component Value Date/Time   NA 142 07/30/2015 1620   K 4.0 07/30/2015 1620   CO2 23 07/30/2015 1620   GLUCOSE 125 07/30/2015 1620   BUN 11.2 07/30/2015 1620   CREATININE 1.4* 07/30/2015 1620   CALCIUM 9.7 07/30/2015 1620   PROT 7.3 07/30/2015 1620   ALBUMIN 4.0 07/30/2015 1620   AST 16 07/30/2015 1620   ALT 25 07/30/2015 1620   ALKPHOS 71 07/30/2015 1620   BILITOT 0.49 07/30/2015 1620    INo results found for: SPEP, UPEP  Lab Results  Component Value Date   WBC 5.5  07/30/2015   NEUTROABS 3.5 07/30/2015   HGB 14.0 07/30/2015   HCT 40.4 07/30/2015   MCV 81.9 07/30/2015   PLT 194 07/30/2015      Chemistry      Component Value Date/Time   NA 142 07/30/2015 1620   K 4.0 07/30/2015 1620   CO2 23 07/30/2015 1620   BUN 11.2 07/30/2015 1620   CREATININE 1.4* 07/30/2015 1620      Component Value Date/Time   CALCIUM 9.7 07/30/2015 1620   ALKPHOS 71 07/30/2015 1620   AST 16 07/30/2015 1620   ALT 25 07/30/2015 1620   BILITOT 0.49 07/30/2015 1620       No results found for: LABCA2  No components found for: LABCA125  No results for input(s): INR in the last 168 hours.  Urinalysis No results found for: COLORURINE, APPEARANCEUR, LABSPEC, PHURINE, GLUCOSEU, HGBUR, BILIRUBINUR, KETONESUR, PROTEINUR, UROBILINOGEN, NITRITE,  LEUKOCYTESUR  STUDIES: Mm Digital Diagnostic Unilat L  07/09/2015   CLINICAL DATA:  Status post ultrasound-guided core biopsy of the left breast mass.  EXAM: DIAGNOSTIC LEFT MAMMOGRAM POST ULTRASOUND BIOPSY  COMPARISON:  Previous exam(s).  FINDINGS: Mammographic images were obtained following ultrasound guided biopsy of a mass in the 7 o'clock subareolar region of the left breast. Mammographic images show there is a ribbon shaped clip associated with the mass in the 7 o'clock subareolar region of the left breast.  IMPRESSION: Status post ultrasound-guided core biopsy of the left breast with pathology pending.  Final Assessment: Post Procedure Mammograms for Marker Placement   Electronically Signed   By: Lillia Mountain M.D.   On: 07/09/2015 11:16   US Breast Ltd [name] Left Inc Axilla  07/16/2015   ADDENDUM REPORT: 07/10/2015 14:01  ADDENDUM: Pathology reveals Grade I invasive ductal carcinoma of the left breast. This was found to be concordant by Dr. Lillia Mountain. Pathology results were discussed with the patient via telephone. The patient reported no problems with the biopsy site and is doing well. Post biopsy instructions were reviewed and questions were answered. The patient was encouraged to call [facility] with any additional questions and or concerns. The patient was referred to Dr. Autumn Messing of [facility] on July 16, 2015.  Pathology results reported by Terie Purser RN on July 10, 2015.   Electronically Signed   By: Lillia Mountain M.D.   On: 07/10/2015 14:01   07/16/2015   CLINICAL DATA:  Mass in each breast on a recent screening mammogram at [facility].  EXAM: DIGITAL DIAGNOSTIC BILATERAL MAMMOGRAM WITH 3D TOMOSYNTHESIS WITH CAD  ULTRASOUND BILATERAL BREAST  COMPARISON:  Previous examinations, including the screening mammogram at [facility] on 07/02/2015 and previous mammogram at the [facility] on 11/23/2005.  ACR  Breast Density Category c: The breast tissue is heterogeneously dense, which may obscure small masses.  FINDINGS: Spot tomographic images of the right breast confirm a small, rounded, circumscribed mass with some associated fat density in the anterior aspect of the lower outer quadrant of the breast.  Spot tomographic images of the left breast confirm an oval, irregular, spiculated mass in the lower inner retroareolar region.  Mammographic images were processed with CAD.  On physical exam, no mass is palpable in the inferior right breast or left axilla. There is an approximately 1.2 cm oval, firm, palpable mass in the lower inner retroareolar left breast, in the 7 o'clock position, immediately adjacent to the nipple.  Targeted ultrasound is performed, showing  three small, oval, circumscribed, hypoechoic masses containing some echogenic material in the 7 o'clock periareolar region of the right breast, 1 cm from the nipple. The ultrasound images are incorrectly labeled 5 o'clock. The largest measures 4 mm in maximum diameter and contains a thin internal septation.  Ultrasound of the left breast demonstrates a 1.3 x 1.3 x 1.0 cm oval, horizontally oriented, hypoechoic mass with irregular margins in the 7 o'clock retroareolar region. This is extending into the overlying skin of the areola and into the inferior aspect of the nipple.  Ultrasound of the left axilla demonstrated normal appearing left axillary lymph nodes.  IMPRESSION: 1. 1.3 cm mass with imaging features highly suspicious for malignancy in the 7 o'clock retroareolar left breast, involving the areola and adjacent nipple. 2. Oil cysts 7 o'clock position of the right breast.  RECOMMENDATION: Ultrasound-guided core needle biopsy of the 1.3 cm 7 o'clock retroareolar left breast mass. This has been discussed with the patient and scheduled at 9 a.m. on 07/09/2015.  I have discussed the findings and recommendations with the patient. Results were also provided in  writing at the conclusion of the visit. If applicable, a reminder letter will be sent to the patient regarding the next appointment.  BI-RADS CATEGORY  5: Highly suggestive of malignancy.  Electronically Signed: By: Claudie Revering M.D. 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 This was found to be concordant by Dr. Lillia Mountain. Pathology results were discussed with the patient via telephone. The patient reported no problems with the biopsy site and is doing well. Post biopsy instructions were reviewed and questions were answered. The patient was encouraged to call [facility] with any additional questions and or concerns. The patient was referred to Dr. Autumn Messing of [facility] on July 16, 2015.  Pathology results reported by Terie Purser RN on July 10, 2015.   Electronically Signed   By: Lillia Mountain M.D.   On: 07/10/2015 14:01   07/16/2015   CLINICAL DATA:  Mass in each breast on a recent screening mammogram at [facility].  EXAM: DIGITAL DIAGNOSTIC BILATERAL MAMMOGRAM WITH 3D TOMOSYNTHESIS WITH CAD  ULTRASOUND BILATERAL BREAST  COMPARISON:  Previous examinations, including the screening mammogram at [facility] on 07/02/2015 and previous mammogram at the [facility] on 11/23/2005.  ACR Breast Density Category c: The breast tissue is heterogeneously dense, which may obscure small masses.  FINDINGS: Spot tomographic images of the right breast confirm a small, rounded, circumscribed mass with some associated fat density in the anterior aspect of the lower outer quadrant of the breast.  Spot tomographic images of the left breast confirm an oval, irregular, spiculated mass in the lower inner retroareolar region.  Mammographic images were processed with CAD.  On physical exam, no mass  is palpable in the inferior right breast or left axilla. There is an approximately 1.2 cm oval, firm, palpable mass in the lower inner retroareolar left breast, in the 7 o'clock position, immediately adjacent to the nipple.  Targeted ultrasound is performed, showing three small, oval, circumscribed, hypoechoic masses containing some echogenic material in the 7 o'clock periareolar region of the right breast, 1 cm from the nipple. The ultrasound images are incorrectly labeled 5 o'clock. The largest measures 4 mm in maximum diameter and contains a thin internal  septation.  Ultrasound of the left breast demonstrates a 1.3 x 1.3 x 1.0 cm oval, horizontally oriented, hypoechoic mass with irregular margins in the 7 o'clock retroareolar region. This is extending into the overlying skin of the areola and into the inferior aspect of the nipple.  Ultrasound of the left axilla demonstrated normal appearing left axillary lymph nodes.  IMPRESSION: 1. 1.3 cm mass with imaging features highly suspicious for malignancy in the 7 o'clock retroareolar left breast, involving the areola and adjacent nipple. 2. Oil cysts 7 o'clock position of the right breast.  RECOMMENDATION: Ultrasound-guided core needle biopsy of the 1.3 cm 7 o'clock retroareolar left breast mass. This has been discussed with the patient and scheduled at 9 a.m. on 07/09/2015.  I have discussed the findings and recommendations with the patient. Results were also provided in writing at the conclusion of the visit. If applicable, a reminder letter will be sent to the patient regarding the next appointment.  BI-RADS CATEGORY  5: Highly suggestive of malignancy.  Electronically Signed: By: Claudie Revering M.D. On: 07/07/2015 13:09   Mm Diag Breast Tomo Bilateral  07/16/2015   ADDENDUM REPORT: 07/10/2015 14:01  ADDENDUM: Pathology reveals Grade I invasive ductal carcinoma of the left breast. This was found to be concordant by Dr. Lillia Mountain. Pathology results were discussed  with the patient via telephone. The patient reported no problems with the biopsy site and is doing well. Post biopsy instructions were reviewed and questions were answered. The patient was encouraged to call [facility] with any additional questions and or concerns. The patient was referred to Dr. Autumn Messing of [facility] on July 16, 2015.  Pathology results reported by Terie Purser RN on July 10, 2015.   Electronically Signed   By: Lillia Mountain M.D.   On: 07/10/2015 14:01   07/16/2015   CLINICAL DATA:  Mass in each breast on a recent screening mammogram at [facility].  EXAM: DIGITAL DIAGNOSTIC BILATERAL MAMMOGRAM WITH 3D TOMOSYNTHESIS WITH CAD  ULTRASOUND BILATERAL BREAST  COMPARISON:  Previous examinations, including the screening mammogram at [facility] on 07/02/2015 and previous mammogram at the [facility] on 11/23/2005.  ACR Breast Density Category c: The breast tissue is heterogeneously dense, which may obscure small masses.  FINDINGS: Spot tomographic images of the right breast confirm a small, rounded, circumscribed mass with some associated fat density in the anterior aspect of the lower outer quadrant of the breast.  Spot tomographic images of the left breast confirm an oval, irregular, spiculated mass in the lower inner retroareolar region.  Mammographic images were processed with CAD.  On physical exam, no mass is palpable in the inferior right breast or left axilla. There is an approximately 1.2 cm oval, firm, palpable mass in the lower inner retroareolar left breast, in the 7 o'clock position, immediately adjacent to the nipple.  Targeted ultrasound is performed, showing three small, oval, circumscribed, hypoechoic masses containing some echogenic material in the 7 o'clock periareolar region of the right breast, 1 cm from the nipple. The ultrasound images are incorrectly labeled 5 o'clock. The largest measures  4 mm in maximum diameter and contains a thin internal septation.  Ultrasound of the left breast demonstrates a 1.3 x 1.3 x 1.0 cm oval, horizontally oriented, hypoechoic mass with irregular margins in the 7 o'clock retroareolar region. This is extending into the overlying skin of the areola and into the inferior aspect of the nipple.  Ultrasound of the left axilla demonstrated normal appearing left axillary lymph nodes.  IMPRESSION: 1. 1.3 cm mass with imaging features highly suspicious for malignancy in the 7 o'clock retroareolar left breast, involving the areola and adjacent nipple. 2. Oil cysts 7 o'clock position of the right breast.  RECOMMENDATION: Ultrasound-guided core needle biopsy of the 1.3 cm 7 o'clock retroareolar left breast mass. This has been discussed with the patient and scheduled at 9 a.m. on 07/09/2015.  I have discussed the findings and recommendations with the patient. Results were also provided in writing at the conclusion of the visit. If applicable, a reminder letter will be sent to the patient regarding the next appointment.  BI-RADS CATEGORY  5: Highly suggestive of malignancy.  Electronically Signed: By: Claudie Revering M.D. On: 07/07/2015 13:09   [place] Lt Breast Bx W Loc Dev 1st Lesion Img Bx Spec US Guide  07/09/2015   CLINICAL DATA:  Suspicious left breast mass.  EXAM: ULTRASOUND GUIDED LEFT BREAST CORE NEEDLE BIOPSY  COMPARISON:  Previous exam(s).  PROCEDURE: I met with the patient and we discussed the procedure of ultrasound-guided biopsy, including benefits and alternatives. We discussed the high likelihood of a successful procedure. We discussed the risks of the procedure including infection, bleeding, tissue injury, clip migration, and inadequate sampling. Informed written consent was given. The usual time-out protocol was performed immediately prior to the procedure.  Using sterile technique and 2% Lidocaine as local anesthetic, under direct ultrasound visualization, a 12 gauge  vacuum-assisted device was used to perform biopsy of a mass in the 7 o'clock subareolar region of the left breastusing a lateral to medial approach. At the conclusion of the procedure, a ribbon shaped tissue marker clip was deployed into the biopsy cavity. Follow-up 2-view mammogram was performed and dictated separately.  IMPRESSION: Ultrasound-guided biopsy of the left breast. No apparent complications.   Electronically Signed   By: Lillia Mountain M.D.   On: 07/09/2015 10:50    ASSESSMENT: 59 y.o. [place], [place] Woman status post left breast biopsy 07/09/2015 for a clinical T1c N0, stage IA invasive ductal carcinoma, grade 1, estrogen and progesterone receptor strongly positive, HER-2 not amplified, with an MIB-1 of 10%   (1) breast conserving surgery with sentinel lymph node sampling pending  (2) Oncotype DX to be requested from the definitive surgical specimen  (3) adjuvant radiation to follow  (4) anti-estrogens to follow completion of local treatment  PLAN: We spent the better part of today's hour-long appointment discussing the biology of breast cancer in general, and the specifics of the patient's tumor in particular. Audrey Fischer understands in particular there is no survival advantage to mastectomy as compared to lumpectomy plus radiation. She had a concern regarding radiation to the left side because of her heart problems, and we discussed the breath holding technique as well as other maneuvers that are routinely performed here to get the heart out of the radiation field. This was reassuring to her and at this point she is planning to have a lumpectomy with sentinel lymph node sampling as originally suggested by Dr. Marlou Starks  As far as systemic therapy is concerned, she will be an excellent candidate for anti-estrogens. She of course will not benefit from anti-HER-2 treatment.  The chemotherapy question is more complex. She has a slow growing nonaggressive looking tumor and in those luminal a like  cases the benefit of chemotherapy may be very marginal.  For that reason we will be requesting an Oncotype in this case she understands the  results will take about 2 weeks and so I will plan to see her sometime in early October. By then we should have the definitive results and should be able to make a decision regarding chemotherapy. However she understands my expectation is that she is unlikely to need chemotherapy.  Today also I gave her advanced directive forms to complete at her discretion.   Audrey Fischer has a good understanding of the overall plan. She agrees with it. She knows the goal of treatment in her case is cure. She will call with any problems that may develop before her next visit here.  Audrey Cruel, MD   07/30/2015 5:28 PM Medical Oncology and Hematology [facility] [address] [place], [place] [address] Tel. [phone]    Fax. [phone]

## 2015-07-31 ENCOUNTER — Telehealth: Payer: Self-pay | Admitting: Oncology

## 2015-07-31 ENCOUNTER — Encounter: Payer: Self-pay | Admitting: Internal Medicine

## 2015-07-31 NOTE — Telephone Encounter (Signed)
Left message to confirm appointment for October. °

## 2015-08-05 ENCOUNTER — Other Ambulatory Visit: Payer: Self-pay | Admitting: General Surgery

## 2015-08-05 DIAGNOSIS — C50512 Malignant neoplasm of lower-outer quadrant of left female breast: Secondary | ICD-10-CM

## 2015-08-08 ENCOUNTER — Other Ambulatory Visit: Payer: Self-pay | Admitting: Obstetrics and Gynecology

## 2015-08-11 ENCOUNTER — Encounter (HOSPITAL_BASED_OUTPATIENT_CLINIC_OR_DEPARTMENT_OTHER): Payer: Self-pay | Admitting: *Deleted

## 2015-08-14 ENCOUNTER — Encounter (HOSPITAL_BASED_OUTPATIENT_CLINIC_OR_DEPARTMENT_OTHER): Payer: Self-pay | Admitting: *Deleted

## 2015-08-14 DIAGNOSIS — N882 Stricture and stenosis of cervix uteri: Secondary | ICD-10-CM | POA: Diagnosis present

## 2015-08-14 DIAGNOSIS — D219 Benign neoplasm of connective and other soft tissue, unspecified: Secondary | ICD-10-CM | POA: Diagnosis present

## 2015-08-14 DIAGNOSIS — N950 Postmenopausal bleeding: Secondary | ICD-10-CM | POA: Diagnosis present

## 2015-08-14 DIAGNOSIS — R9389 Abnormal findings on diagnostic imaging of other specified body structures: Secondary | ICD-10-CM | POA: Diagnosis present

## 2015-08-14 NOTE — Progress Notes (Signed)
NPO AFTER MN.  ARRIVE AT 0600.  NEEDS CBC.  CURRENT CMET RESULTS IN CHART AND EPIC.  CURRENT EKG TO BE FAXED FROM , [facility].  LOV NOTE IN EPIC UNDER CARE EVERYWHERE TAB.

## 2015-08-14 NOTE — H&P (Signed)
Audrey Fischer is an 59 y.o. female presents for further workup of postmenopausal bleeding.  The patient had her last normal menstrual period in 2014. In Julieta 2016. She had 7 days of bleeding like a menses. In August 2016. She presented for a new patient GYN examination with a history of 7 days of bleeding. She underwent a normal Pap smear and negative HPV test. Sonohysterogram and endometrial biopsy could not be performed because of cervical stenosis.   Pertinent Gynecological History: Menses: post-menopausal Bleeding: post menopausal bleeding Contraception: abstinence DES exposure: unknown Blood transfusions: none Sexually transmitted diseases: no past history Previous GYN Procedures: Cesarean section  Last mammogram: abnormal: Undergoing treatment for breast cancer noted on 2016 mammogram Date: 2016 Last pap: normal Date: 2016 OB History: G 3, P 2   Menstrual History: LMP 2014   Past Medical History  Diagnosis Date  . Hypertension   . Hyperlipidemia   . Breast cancer 07/09/15    left breast,  lower-inner quadrant   . Coronary artery disease     cardiologist-  dr Donnetta Hutching [facility], [facility] cardiology in [facility])---  hx PCI w/ stents to OM and LAD 02-08-2008  . S/P coronary artery stent placement   . PMB (postmenopausal bleeding)     Past Surgical History  Procedure Laterality Date  . Cesarean section  10-09-1996  . Coronary angioplasty with stent placement  02-08-2008   dr Donnetta Hutching    PCI and stent to OM and LAD  . Dilation and curettage of uterus  1994    blighted ovum    Family History  Problem Relation Age of Onset  . Heart disease Mother   . Stroke Mother   . Colon cancer Mother 80  . Heart disease Father     Social History:  reports that she has never smoked. She has never used smokeless tobacco. She reports that she drinks alcohol. She reports that she does not use illicit drugs.  Allergies:  Allergies  Allergen Reactions  . Capsule [Gelatin]  Hives    Any medication that is in gel capsule form    Prescriptions prior to admission  Medication Sig Dispense Refill Last Dose  . aspirin 81 MG tablet Take 81 mg by mouth daily.   08/13/2015 at Unknown time  . atenolol (TENORMIN) 50 MG tablet Take 50 mg by mouth every evening.    08/15/2015 at [facility]  . Cholecalciferol (VITAMIN D3) 2000 UNITS TABS Take 1 tablet by mouth daily.   08/14/2015 at Unknown time  . rosuvastatin (CRESTOR) 10 MG tablet Take 10 mg by mouth every evening.    08/15/2015 at 0015  . nitroGLYCERIN (NITROSTAT) 0.4 MG SL tablet Place 0.4 mg under the tongue.   More than a month at Unknown time   Ultrasound: Uterus upper limits of normal size with small uterine fibroids, the largest of which measured less than 3 cm. There is a 2.1 cm simple left ovarian cyst. The endometrial thickness was close to 8 mm. Sonohysterogram could not be performed because of cervical stenosis.  Endometrial biopsy could likewise not be performed because of cervical stenosis CA 125: Normal   Review of Systems  Constitutional: Negative.   HENT: Negative.   Eyes: Negative.   Respiratory: Negative.   Cardiovascular: Negative.   Gastrointestinal: Negative.   Genitourinary: Negative.   Musculoskeletal: Negative.   Skin: Negative.   Neurological: Negative.   Endo/Heme/Allergies: Negative.   Psychiatric/Behavioral: Negative.     Blood pressure 170/91, pulse 63, temperature  98.7 F (37.1 C), temperature source Oral, resp. rate 16, height 5' 8.5" (1.74 m), weight 92.08 kg (203 lb), SpO2 100 %. Physical Exam  Constitutional: She is oriented to person, place, and time. She appears well-developed and well-nourished.  HENT:  Head: Normocephalic and atraumatic.  Eyes: EOM are normal.  Neck: Normal range of motion. Neck supple.  Cardiovascular: Normal rate and regular rhythm.   Respiratory: Effort normal and breath sounds normal.  GI: Soft. Bowel sounds are normal.  Musculoskeletal: Normal range of  motion.  Neurological: She is alert and oriented to person, place, and time.  Skin: Skin is warm.  Psychiatric: She has a normal mood and affect.    Results for orders placed or performed during the hospital encounter of 08/15/15 (from the past 24 hour(s))  CBC     Status: Abnormal   Collection Time: 08/15/15  6:40 AM  Result Value Ref Range   WBC 6.6 4.0 - 10.5 K/uL   RBC 5.12 (H) 3.87 - 5.11 MIL/uL   Hemoglobin 14.3 12.0 - 15.0 g/dL   HCT 42.7 36.0 - 46.0 %   MCV 83.4 78.0 - 100.0 fL   MCH 27.9 26.0 - 34.0 pg   MCHC 33.5 30.0 - 36.0 g/dL   RDW 13.1 11.5 - 15.5 %   Platelets 183 150 - 400 K/uL    No results found.  Assessment/Plan: 59 year old postmenopausal patient with episode of vaginal bleeding approximately 14 months after her last menstrual cycle. Workup was hampered by cervical stenosis and inability to obtain a tissue sample in the presence of apparent endometrial thickening.  Breast cancer, currently under workup  Recommendation: Hysteroscopy with D&C has been recommended for obtaining a tissue sample. Because of the patient's postmenopausal bleeding. The risks of anesthesia, bleeding, infection, damage to adjacent organs, and the risk of uterine perforation, have been explained to the patient. She has had her questions answered and she wishes to proceed. This will be done on 9/16/ 2016 at the [facility] outpatient surgical center. Ocean Kearley P 08/15/2015, 7:28 AM

## 2015-08-15 ENCOUNTER — Ambulatory Visit (HOSPITAL_BASED_OUTPATIENT_CLINIC_OR_DEPARTMENT_OTHER)
Admission: RE | Admit: 2015-08-15 | Discharge: 2015-08-15 | Disposition: A | Discharge status: Home / Self Care | Payer: BC Managed Care – PPO | Source: Ambulatory Visit | Attending: Obstetrics and Gynecology | Admitting: Obstetrics and Gynecology

## 2015-08-15 ENCOUNTER — Ambulatory Visit (HOSPITAL_BASED_OUTPATIENT_CLINIC_OR_DEPARTMENT_OTHER): Payer: BC Managed Care – PPO | Admitting: Anesthesiology

## 2015-08-15 ENCOUNTER — Encounter (HOSPITAL_BASED_OUTPATIENT_CLINIC_OR_DEPARTMENT_OTHER): Payer: Self-pay

## 2015-08-15 ENCOUNTER — Encounter (HOSPITAL_BASED_OUTPATIENT_CLINIC_OR_DEPARTMENT_OTHER)
Admission: RE | Disposition: A | Discharge status: Home / Self Care | Payer: Self-pay | Source: Ambulatory Visit | Attending: Obstetrics and Gynecology

## 2015-08-15 ENCOUNTER — Other Ambulatory Visit: Payer: Self-pay

## 2015-08-15 DIAGNOSIS — D259 Leiomyoma of uterus, unspecified: Secondary | ICD-10-CM | POA: Insufficient documentation

## 2015-08-15 DIAGNOSIS — N950 Postmenopausal bleeding: Secondary | ICD-10-CM | POA: Diagnosis present

## 2015-08-15 DIAGNOSIS — Z7982 Long term (current) use of aspirin: Secondary | ICD-10-CM | POA: Diagnosis not present

## 2015-08-15 DIAGNOSIS — N8320 Unspecified ovarian cysts: Secondary | ICD-10-CM | POA: Diagnosis not present

## 2015-08-15 DIAGNOSIS — I2510 Atherosclerotic heart disease of native coronary artery without angina pectoris: Secondary | ICD-10-CM | POA: Diagnosis not present

## 2015-08-15 DIAGNOSIS — R9389 Abnormal findings on diagnostic imaging of other specified body structures: Secondary | ICD-10-CM | POA: Diagnosis present

## 2015-08-15 DIAGNOSIS — I10 Essential (primary) hypertension: Secondary | ICD-10-CM | POA: Diagnosis present

## 2015-08-15 DIAGNOSIS — Z955 Presence of coronary angioplasty implant and graft: Secondary | ICD-10-CM | POA: Diagnosis not present

## 2015-08-15 DIAGNOSIS — Z79899 Other long term (current) drug therapy: Secondary | ICD-10-CM | POA: Insufficient documentation

## 2015-08-15 DIAGNOSIS — N882 Stricture and stenosis of cervix uteri: Secondary | ICD-10-CM | POA: Diagnosis present

## 2015-08-15 DIAGNOSIS — E785 Hyperlipidemia, unspecified: Secondary | ICD-10-CM | POA: Insufficient documentation

## 2015-08-15 DIAGNOSIS — Z853 Personal history of malignant neoplasm of breast: Secondary | ICD-10-CM | POA: Diagnosis not present

## 2015-08-15 DIAGNOSIS — D219 Benign neoplasm of connective and other soft tissue, unspecified: Secondary | ICD-10-CM | POA: Diagnosis present

## 2015-08-15 HISTORY — DX: Postmenopausal bleeding: N95.0

## 2015-08-15 HISTORY — PX: DILATATION & CURRETTAGE/HYSTEROSCOPY WITH RESECTOCOPE: SHX5572

## 2015-08-15 HISTORY — DX: Atherosclerotic heart disease of native coronary artery without angina pectoris: I25.10

## 2015-08-15 HISTORY — DX: Presence of coronary angioplasty implant and graft: Z95.5

## 2015-08-15 LAB — CBC
HEMATOCRIT: 42.7 % (ref 36.0–46.0)
HEMOGLOBIN: 14.3 g/dL (ref 12.0–15.0)
MCH: 27.9 pg (ref 26.0–34.0)
MCHC: 33.5 g/dL (ref 30.0–36.0)
MCV: 83.4 fL (ref 78.0–100.0)
Platelets: 183 10*3/uL (ref 150–400)
RBC: 5.12 MIL/uL — AB (ref 3.87–5.11)
RDW: 13.1 % (ref 11.5–15.5)
WBC: 6.6 10*3/uL (ref 4.0–10.5)

## 2015-08-15 SURGERY — DILATATION & CURETTAGE/HYSTEROSCOPY WITH RESECTOCOPE
Anesthesia: General | Site: Vagina

## 2015-08-15 MED ORDER — LACTATED RINGERS IV SOLN
INTRAVENOUS | Status: DC
  Administered 2015-08-15 (×2): via INTRAVENOUS
  Filled 2015-08-15: qty 1000

## 2015-08-15 MED ORDER — CHLORHEXIDINE GLUCONATE 4 % EX LIQD
1.0000 "application " | Freq: Once | CUTANEOUS | Status: DC
  Filled 2015-08-15: qty 15

## 2015-08-15 MED ORDER — KETOROLAC TROMETHAMINE 30 MG/ML IJ SOLN
INTRAMUSCULAR | Status: DC | PRN
  Administered 2015-08-15: 30 mg via INTRAVENOUS

## 2015-08-15 MED ORDER — OXYCODONE HCL 5 MG PO TABS
5.0000 mg | ORAL_TABLET | Freq: Once | ORAL | Status: DC | PRN
  Filled 2015-08-15: qty 1

## 2015-08-15 MED ORDER — PROMETHAZINE HCL 25 MG/ML IJ SOLN
6.2500 mg | INTRAMUSCULAR | Status: DC | PRN
  Filled 2015-08-15: qty 1

## 2015-08-15 MED ORDER — FENTANYL CITRATE (PF) 100 MCG/2ML IJ SOLN
INTRAMUSCULAR | Status: DC | PRN
  Administered 2015-08-15 (×2): 25 ug via INTRAVENOUS
  Administered 2015-08-15: 50 ug via INTRAVENOUS

## 2015-08-15 MED ORDER — PROPOFOL 10 MG/ML IV BOLUS
INTRAVENOUS | Status: DC | PRN
  Administered 2015-08-15: 180 mg via INTRAVENOUS

## 2015-08-15 MED ORDER — IBUPROFEN 600 MG PO TABS: ORAL_TABLET | ORAL | Status: DC

## 2015-08-15 MED ORDER — LIDOCAINE HCL 2 % IJ SOLN
INTRAMUSCULAR | Status: DC | PRN
  Administered 2015-08-15: 10 mL

## 2015-08-15 MED ORDER — MIDAZOLAM HCL 5 MG/5ML IJ SOLN
INTRAMUSCULAR | Status: DC | PRN
  Administered 2015-08-15: 2 mg via INTRAVENOUS

## 2015-08-15 MED ORDER — CEFAZOLIN SODIUM-DEXTROSE 2-3 GM-% IV SOLR
2.0000 g | INTRAVENOUS | Status: DC
  Filled 2015-08-15: qty 50

## 2015-08-15 MED ORDER — DEXAMETHASONE SODIUM PHOSPHATE 4 MG/ML IJ SOLN
INTRAMUSCULAR | Status: DC | PRN
  Administered 2015-08-15: 10 mg via INTRAVENOUS

## 2015-08-15 MED ORDER — MIDAZOLAM HCL 2 MG/2ML IJ SOLN
INTRAMUSCULAR | Status: AC
  Filled 2015-08-15: qty 2

## 2015-08-15 MED ORDER — HYDROMORPHONE HCL 1 MG/ML IJ SOLN
0.2500 mg | INTRAMUSCULAR | Status: DC | PRN
  Filled 2015-08-15: qty 1

## 2015-08-15 MED ORDER — HYDROCODONE-ACETAMINOPHEN 5-325 MG PO TABS: 1.0000 | ORAL_TABLET | Freq: Four times a day (QID) | ORAL | Status: DC | PRN

## 2015-08-15 MED ORDER — GLYCINE 1.5 % IR SOLN
Status: DC | PRN
  Administered 2015-08-15: 3000 mL

## 2015-08-15 MED ORDER — ONDANSETRON HCL 4 MG/2ML IJ SOLN
INTRAMUSCULAR | Status: DC | PRN
  Administered 2015-08-15: 4 mg via INTRAVENOUS

## 2015-08-15 MED ORDER — KETOROLAC TROMETHAMINE 60 MG/2ML IM SOLN
INTRAMUSCULAR | Status: DC | PRN
  Administered 2015-08-15: 30 mg via INTRAMUSCULAR

## 2015-08-15 MED ORDER — FENTANYL CITRATE (PF) 100 MCG/2ML IJ SOLN
INTRAMUSCULAR | Status: AC
  Filled 2015-08-15: qty 6

## 2015-08-15 MED ORDER — OXYCODONE HCL 5 MG/5ML PO SOLN
5.0000 mg | Freq: Once | ORAL | Status: DC | PRN
  Filled 2015-08-15: qty 5

## 2015-08-15 MED ORDER — LIDOCAINE HCL (CARDIAC) 20 MG/ML IV SOLN
INTRAVENOUS | Status: DC | PRN
  Administered 2015-08-15: 80 mg via INTRAVENOUS

## 2015-08-15 SURGICAL SUPPLY — 32 items
BOOTIES KNEE HIGH SLOAN (MISCELLANEOUS) ×6 IMPLANT
CANISTER SUCT 3000ML (MISCELLANEOUS) ×3 IMPLANT
CATH ROBINSON RED A/P 16FR (CATHETERS) ×3 IMPLANT
CONTAINER PREFILL 10% NBF 60ML (FORM) ×2 IMPLANT
CORD ACTIVE DISPOSABLE (ELECTRODE) ×2
CORD ELECTRO ACTIVE DISP (ELECTRODE) ×1 IMPLANT
COVER BACK TABLE 60X90IN (DRAPES) ×3 IMPLANT
DRAPE LG THREE QUARTER DISP (DRAPES) ×3 IMPLANT
DRAPE UNDERBUTTOCKS STRL (DRAPE) ×3 IMPLANT
ELECT LOOP GYNE PRO 24FR (CUTTING LOOP)
ELECT REM PT RETURN 9FT ADLT (ELECTROSURGICAL) ×3
ELECT VAPORTRODE GRVD BAR (ELECTRODE) IMPLANT
ELECTRODE LOOP GYNE PRO 24FR (CUTTING LOOP) IMPLANT
ELECTRODE REM PT RTRN 9FT ADLT (ELECTROSURGICAL) ×1 IMPLANT
GLOVE BIO SURGEON STRL SZ 6.5 (GLOVE) ×1 IMPLANT
GLOVE BIO SURGEONS STRL SZ 6.5 (GLOVE) ×1
GLOVE BIOGEL PI IND STRL 6.5 (GLOVE) ×1 IMPLANT
GLOVE BIOGEL PI IND STRL 7.5 (GLOVE) IMPLANT
GLOVE BIOGEL PI INDICATOR 6.5 (GLOVE) ×2
GLOVE BIOGEL PI INDICATOR 7.5 (GLOVE) ×2
GLOVE SURG SS PI 6.5 STRL IVOR (GLOVE) ×6 IMPLANT
GOWN STRL REUS W/TWL LRG LVL3 (GOWN DISPOSABLE) ×3 IMPLANT
GOWN STRL REUS W/TWL XL LVL3 (GOWN DISPOSABLE) ×3 IMPLANT
LEGGING LITHOTOMY PAIR STRL (DRAPES) ×3 IMPLANT
LOOP ANGLED CUTTING 22FR (CUTTING LOOP) IMPLANT
PACK BASIN DAY SURGERY FS (CUSTOM PROCEDURE TRAY) ×3 IMPLANT
PAD OB MATERNITY 4.3X12.25 (PERSONAL CARE ITEMS) ×3 IMPLANT
TOWEL OR 17X24 6PK STRL BLUE (TOWEL DISPOSABLE) ×6 IMPLANT
TRAY DSU PREP LF (CUSTOM PROCEDURE TRAY) ×3 IMPLANT
TUBING AQUILEX INFLOW (TUBING) ×3 IMPLANT
TUBING AQUILEX OUTFLOW (TUBING) ×3 IMPLANT
WATER STERILE IRR 500ML POUR (IV SOLUTION) ×3 IMPLANT

## 2015-08-15 NOTE — H&P (Signed)
History and Physical Interval Note:   08/15/2015   7:29 AM   Audrey Fischer  has presented today for surgery, with the diagnosis of Post-menopausal Bleeding  The various methods of treatment have been discussed with the patient and family. After consideration of risks, benefits and other options for treatment, the patient has consented to  Procedure(s): [place] as a surgical intervention .  I have reviewed the patients' chart and labs.  Questions were answered to the patient's satisfaction.     Eldred Manges  MD

## 2015-08-15 NOTE — Anesthesia Procedure Notes (Signed)
Procedure Name: LMA Insertion Date/Time: 08/15/2015 7:42 AM Performed by: Denna Haggard D Pre-anesthesia Checklist: Patient identified, Emergency Drugs available, Suction available and Patient being monitored Patient Re-evaluated:Patient Re-evaluated prior to inductionOxygen Delivery Method: Circle System Utilized Preoxygenation: Pre-oxygenation with 100% oxygen Intubation Type: IV induction Ventilation: Mask ventilation without difficulty LMA: LMA inserted LMA Size: 4.0 Number of attempts: 1 Airway Equipment and Method: Bite block Placement Confirmation: positive ETCO2 Tube secured with: Tape Dental Injury: Teeth and Oropharynx as per pre-operative assessment

## 2015-08-15 NOTE — Anesthesia Preprocedure Evaluation (Addendum)
Anesthesia Evaluation  Patient identified by MRN, date of birth, ID band Patient awake    Reviewed: Allergy & Precautions, NPO status , Patient's Chart, lab work & pertinent test results, reviewed documented beta blocker date and time   Airway Mallampati: II  TM Distance: >3 FB Neck ROM: Full    Dental  (+) Teeth Intact, Dental Advisory Given   Pulmonary neg pulmonary ROS,    breath sounds clear to auscultation       Cardiovascular Exercise Tolerance: Good hypertension, Pt. on home beta blockers (-) angina+ CAD and + Cardiac Stents   Rhythm:Regular Rate:Normal     Neuro/Psych negative neurological ROS     GI/Hepatic negative GI ROS, Neg liver ROS,   Endo/Other    Renal/GU negative Renal ROS     Musculoskeletal   Abdominal   Peds  Hematology Breast CA   Anesthesia Other Findings   Reproductive/Obstetrics                            Anesthesia Physical Anesthesia Plan  ASA: II  Anesthesia Plan: General   Post-op Pain Management:    Induction: Intravenous  Airway Management Planned: LMA  Additional Equipment:   Intra-op Plan:   Post-operative Plan:   Informed Consent: I have reviewed the patients History and Physical, chart, labs and discussed the procedure including the risks, benefits and alternatives for the proposed anesthesia with the patient or authorized representative who has indicated his/her understanding and acceptance.     Plan Discussed with: CRNA  Anesthesia Plan Comments:         Anesthesia Quick Evaluation

## 2015-08-15 NOTE — Op Note (Signed)
08/15/2015  8:36 AM  PATIENT:  Audrey Fischer  59 y.o. female  PRE-OPERATIVE DIAGNOSIS:  Post-menopausal Bleeding  POST-OPERATIVE DIAGNOSIS:  Post-menopausal Bleeding  PROCEDURE:  Procedure(s): DILATATION & CURETTAGE/HYSTEROSCOPY   SURGEON:  Eldred Manges, MD  ASSISTANTS: nonr  ANESTHESIA:   general  ESTIMATED BLOOD LOSS: minimal  BLOOD ADMINISTERED:none  COMPLICATIONS:none  FINDINGS: No endometrial lesions.   Shaggy endometrial lining.  Left ostium visualized and appeared nl  FLUID DEFICIT:300cc  LOCAL MEDICATIONS USED:  LIDOCAINE  and Amount: 10 ml  SPECIMEN:  Source of Specimen:  2 specimens: endocervical and endometrial currettings  DISPOSITION OF SPECIMEN:  PATHOLOGY  COUNTS:  YES  DESCRIPTION OF PROCEDURE:the patient was taken to the operating room after appropriate identification and placed on the operating table. After the attainment of adequate general anesthesia she was placed in the lithotomy position. The perineum and vagina were prepped with multiple layers of Betadine. The bladder was emptied with a an in and out catheter. The perineum was draped in sterile field. A gray speculum was placed in the vagina. The cervix was grasped with a single-tooth tenaculum. A paracervical block was achieved with a total of 10 cc of 2% Xylocaine and the 5 and 7:00 positions. The uterus was sounded to 10 cm.  An endocervical curettage was performed  The cervix was then dilated to accommodate the diagnostic hysteroscope. The hysteroscope was used to evaluate all quadrants of the uterus.No lesions were noted;  The uterus was then curetted in all quadrants with small amount of tissue obtained. All instruments were then removed from the vagina and the patient was awakened from general anesthesia and taken to the recovery room in satisfactory condition having tolerated the procedure well sponge and instrument counts correct.  PLAN OF CARE: Discharge home after PACU  PATIENT  DISPOSITION:  PACU - hemodynamically stable.   Delay start of Pharmacological VTE agent (>24hrs) due to surgical blood loss or risk of bleeding:  Yes.  SCD hose were used during the case.   Eldred Manges, MD 8:36 AM

## 2015-08-15 NOTE — Transfer of Care (Signed)
Immediate Anesthesia Transfer of Care Note  Patient: Audrey Fischer  Procedure(s) Performed: Procedure(s) (LRB): DILATATION & CURETTAGE/HYSTEROSCOPY WITH RESECTOCOPE (N/A)  Patient Location: PACU  Anesthesia Type: General  Level of Consciousness: awake, oriented, sedated and patient cooperative  Airway & Oxygen Therapy: Patient Spontanous Breathing and Patient connected to face mask oxygen  Post-op Assessment: Report given to PACU RN and Post -op Vital signs reviewed and stable  Post vital signs: Reviewed and stable  Complications: No apparent anesthesia complications

## 2015-08-15 NOTE — Discharge Instructions (Signed)
Post Anesthesia Home Care Instructions  Activity: Get plenty of rest for the remainder of the day. A responsible adult should stay with you for 24 hours following the procedure.  For the next 24 hours, DO NOT: -Drive a car -Paediatric nurse -Drink alcoholic beverages -Take any medication unless instructed by your physician -Make any legal decisions or sign important papers.  Meals: Start with liquid foods such as gelatin or soup. Progress to regular foods as tolerated. Avoid greasy, spicy, heavy foods. If nausea and/or vomiting occur, drink only clear liquids until the nausea and/or vomiting subsides. Call your physician if vomiting continues.  Special Instructions/Symptoms: Your throat may feel dry or sore from the anesthesia or the breathing tube placed in your throat during surgery. If this causes discomfort, gargle with warm salt water. The discomfort should disappear within 24 hours.  If you had a scopolamine patch placed behind your ear for the management of post- operative nausea and/or vomiting:  1. The medication in the patch is effective for 72 hours, after which it should be removed.  Wrap patch in a tissue and discard in the trash. Wash hands thoroughly with soap and water. 2. You may remove the patch earlier than 72 hours if you experience unpleasant side effects which may include dry mouth, dizziness or visual disturbances. 3. Avoid touching the patch. Wash your hands with soap and water after contact with the patch.   Hysteroscopy, Care After Refer to this sheet in the next few weeks. These instructions provide you with information on caring for yourself after your procedure. Your health care provider may also give you more specific instructions. Your treatment has been planned according to current medical practices, but problems sometimes occur. Call your health care provider if you have any problems or questions after your procedure.  WHAT TO EXPECT AFTER THE  PROCEDURE After your procedure, it is typical to have the following:  You may have some cramping. This normally lasts for a couple days.  You may have bleeding. This can vary from light spotting for a few days to menstrual-like bleeding for 3-7 days. HOME CARE INSTRUCTIONS  Rest for the first 1-2 days after the procedure.  Only take over-the-counter or prescription medicines as directed by your health care provider. Do not take aspirin. It can increase the chances of bleeding.  Take showers instead of baths for 2 weeks or as directed by your health care provider.  Do not drive for 24 hours or as directed.  Do not drink alcohol while taking pain medicine.  Do not use tampons, douche, or have sexual intercourse for 2 weeks or until your health care provider says it is okay.  Take your temperature twice a day for 4-5 days. Write it down each time.  Follow your health care provider's advice about diet, exercise, and lifting.  If you develop constipation, you may:  Take a mild laxative if your health care provider approves.  Add bran foods to your diet.  Drink enough fluids to keep your urine clear or pale yellow.  Try to have someone with you or available to you for the first 24-48 hours, especially if you were given a general anesthetic.  Follow up with your health care provider as directed. SEEK MEDICAL CARE IF:  You feel dizzy or lightheaded.  You feel sick to your stomach (nauseous).  You have abnormal vaginal discharge.  You have a rash.  You have pain that is not controlled with medicine. SEEK IMMEDIATE MEDICAL CARE IF:  You have bleeding that is heavier than a normal menstrual period. °· You have a fever. °· You have increasing cramps or pain, not controlled with medicine. °· You have new belly (abdominal) pain. °· You pass out. °· You have pain in the tops of your shoulders (shoulder strap areas). °· You have shortness of breath. °Document Released: 09/05/2013  Document Reviewed: 09/05/2013 °Exit[employer]® Patient Information ©2015 [employer]. This information is not intended to replace advice given to you by your health care provider. Make sure you discuss any questions you have with your health care provider. ° °

## 2015-08-15 NOTE — Anesthesia Postprocedure Evaluation (Signed)
  Anesthesia Post-op Note  Patient: Audrey Fischer  Procedure(s) Performed: Procedure(s): DILATATION & CURETTAGE/HYSTEROSCOPY  (N/A)  Patient Location: PACU  Anesthesia Type:General  Level of Consciousness: awake and alert   Airway and Oxygen Therapy: Patient Spontanous Breathing  Post-op Pain: none  Post-op Assessment: Post-op Vital signs reviewed              Post-op Vital Signs: Reviewed  Last Vitals:  Filed Vitals:   08/15/15 0930  BP: 150/88  Pulse: 57  Temp:   Resp: 19    Complications: No apparent anesthesia complications

## 2015-08-18 ENCOUNTER — Encounter (HOSPITAL_BASED_OUTPATIENT_CLINIC_OR_DEPARTMENT_OTHER): Payer: Self-pay | Admitting: Obstetrics and Gynecology

## 2015-08-20 ENCOUNTER — Other Ambulatory Visit: Payer: Self-pay | Admitting: General Surgery

## 2015-08-20 DIAGNOSIS — C50112 Malignant neoplasm of central portion of left female breast: Secondary | ICD-10-CM

## 2015-08-20 NOTE — Progress Notes (Signed)
Called Dr. Ethlyn Gallery office to request pre-op orders, left message on Kim's voicemail.

## 2015-08-20 NOTE — Progress Notes (Signed)
Pt returned pre-op call, the first thing she asked was where the surgery was going to take place. I told her [facility]. She then stated that she would have to cancel this surgery "because I can't go to [facility]". I asked if she meant her insurance would not cover it and she stated "no, I just can't go to [facility]". I instructed her to call Dr. Ethlyn Gallery office now to cancel the surgery. She states she would do so.

## 2015-08-21 ENCOUNTER — Telehealth: Payer: Self-pay | Admitting: *Deleted

## 2015-08-21 ENCOUNTER — Encounter (HOSPITAL_COMMUNITY): Payer: BC Managed Care – PPO

## 2015-08-21 ENCOUNTER — Ambulatory Visit (HOSPITAL_COMMUNITY): Admission: RE | Admit: 2015-08-21 | Payer: BC Managed Care – PPO | Source: Ambulatory Visit | Admitting: General Surgery

## 2015-08-21 ENCOUNTER — Encounter (HOSPITAL_COMMUNITY): Admission: RE | Payer: Self-pay | Source: Ambulatory Visit

## 2015-08-21 ENCOUNTER — Ambulatory Visit (HOSPITAL_COMMUNITY): Payer: BC Managed Care – PPO

## 2015-08-21 SURGERY — BREAST LUMPECTOMY WITH AXILLARY LYMPH NODE BIOPSY
Anesthesia: General | Site: Breast | Laterality: Left

## 2015-08-21 NOTE — Telephone Encounter (Signed)
Called patient back and LVM.  Encouraged patient to call back.

## 2015-08-21 NOTE — Telephone Encounter (Signed)
PT. WANTS A REFERRAL TO A SURGEON IN THE AREA WHO COULD PERFORM HER SURGERY AT [facility].

## 2015-08-25 ENCOUNTER — Telehealth: Payer: Self-pay | Admitting: *Deleted

## 2015-08-25 NOTE — Telephone Encounter (Signed)
Pt called in question about obtaining another surgeon because she does not want to have surgery at [facility].  Spoke with Varney Biles and she stated that [facility] is sending her to [facility] or somewhere.  Informed the pt of this and directed her to call [facility] to f/u on where the referral went for surgery.

## 2015-08-26 ENCOUNTER — Ambulatory Visit (INDEPENDENT_AMBULATORY_CARE_PROVIDER_SITE_OTHER): Payer: BC Managed Care – PPO | Admitting: Internal Medicine

## 2015-08-26 ENCOUNTER — Encounter: Payer: Self-pay | Admitting: Internal Medicine

## 2015-08-26 VITALS — BP 122/80 | HR 83 | Temp 98.5°F | Resp 14 | Ht 68.5 in | Wt 204.0 lb

## 2015-08-26 DIAGNOSIS — E785 Hyperlipidemia, unspecified: Secondary | ICD-10-CM

## 2015-08-26 DIAGNOSIS — C50112 Malignant neoplasm of central portion of left female breast: Secondary | ICD-10-CM | POA: Diagnosis not present

## 2015-08-26 DIAGNOSIS — I10 Essential (primary) hypertension: Secondary | ICD-10-CM | POA: Diagnosis not present

## 2015-08-26 NOTE — Progress Notes (Signed)
Pre visit review using our clinic review tool, if applicable. No additional management support is needed unless otherwise documented below in the visit note. 

## 2015-08-26 NOTE — Patient Instructions (Signed)
We will see you back in about 6 months to check in on the blood pressure and to check on the sugars.   Work on exercising (even if you are just walking) 3 times per week to keep your mind and body healthy.   It is okay to stay off the aspirin until after the surgery.

## 2015-08-26 NOTE — Telephone Encounter (Signed)
Dr Pecolia Ades at [facility] would be a good choice

## 2015-08-27 ENCOUNTER — Encounter: Payer: Self-pay | Admitting: Internal Medicine

## 2015-08-27 DIAGNOSIS — E785 Hyperlipidemia, unspecified: Secondary | ICD-10-CM | POA: Insufficient documentation

## 2015-08-27 NOTE — Assessment & Plan Note (Signed)
BP well controlled on atenolol and reviewed recent labs, no indication for change today.

## 2015-08-27 NOTE — Assessment & Plan Note (Signed)
Lipids controlled on crestor 10 mg daily and per her reports has been recently checked. No side effects.

## 2015-08-27 NOTE — Assessment & Plan Note (Signed)
She does not wish to have the surgery at [facility] and is going to [facility] for her surgery. She has prior bad experience there and anxiety would be too high to return there. Established with oncology in [place] and will see them for her treatment after final pathology. She is off aspirin pending when her surgery will be and agree with that. Talked to her about resuming some mild exercise for her mental health.

## 2015-08-27 NOTE — Progress Notes (Signed)
   Subjective:    Patient ID: Audrey Fischer, female    DOB: 17-Jan-1956, 59 y.o.   MRN: [id number]  HPI The patient is a 59 YO female coming in new with several new diagnoses. She was recently diagnosed with DCIS breast cancer and is currently awaiting surgical lumpectomy. She is anticipated to have some radiation and chemotherapy following surgery. This is giving her some anxiety and she is trying to do okay with it. No other concerns. Has well controlled blood pressure. Not exercising lately since her cancer diagnosis.   PMH, [facility], social history reviewed and updated.   Review of Systems  Constitutional: Positive for activity change. Negative for fever, appetite change, fatigue and unexpected weight change.       Not exercising lately.  HENT: Negative.   Respiratory: Negative for cough, chest tightness, shortness of breath and wheezing.   Cardiovascular: Negative for chest pain, palpitations and leg swelling.  Gastrointestinal: Negative for nausea, abdominal pain, diarrhea, constipation and abdominal distention.  Genitourinary: Negative.   Musculoskeletal: Negative.   Skin: Negative.   Neurological: Negative.   Psychiatric/Behavioral: Negative.       Objective:   Physical Exam  Constitutional: She is oriented to person, place, and time. She appears well-developed and well-nourished.  HENT:  Head: Normocephalic and atraumatic.  Eyes: EOM are normal.  Neck: Normal range of motion.  Cardiovascular: Normal rate and regular rhythm.   Pulmonary/Chest: Effort normal and breath sounds normal. No respiratory distress. She has no wheezes.  Abdominal: Soft. Bowel sounds are normal. She exhibits no distension. There is no tenderness. There is no rebound.  Musculoskeletal: She exhibits no edema.  Neurological: She is alert and oriented to person, place, and time. Coordination normal.  Skin: Skin is warm and dry.  Psychiatric: She has a normal mood and affect.   Filed Vitals:   08/26/15 1336    BP: 122/80  Pulse: 83  Temp: 98.5 F (36.9 C)  TempSrc: Oral  Resp: 14  Height: 5' 8.5" (1.74 m)  Weight: 204 lb (92.534 kg)  SpO2: 97%      Assessment & Plan:

## 2015-09-01 ENCOUNTER — Encounter: Payer: Self-pay | Admitting: Gastroenterology

## 2015-09-02 ENCOUNTER — Ambulatory Visit: Payer: BC Managed Care – PPO | Admitting: Oncology

## 2015-09-15 ENCOUNTER — Telehealth: Payer: Self-pay | Admitting: *Deleted

## 2015-09-15 NOTE — Telephone Encounter (Signed)
Left message for a return phone call to clarify if patient is coming back here for follow up after having surgery at [facility].  Her surgery seems to be scheduled for 10/25 with Dr. Clovis Riley.  Awaiting patient response.

## 2015-09-26 ENCOUNTER — Telehealth: Payer: Self-pay | Admitting: *Deleted

## 2015-09-26 NOTE — Telephone Encounter (Signed)
Pt called for a f/u appt w/ Dr. Jana Hakim.  Confirmed 09/29/15 appt w/ pt.

## 2015-09-29 ENCOUNTER — Ambulatory Visit (HOSPITAL_BASED_OUTPATIENT_CLINIC_OR_DEPARTMENT_OTHER): Payer: BC Managed Care – PPO | Admitting: Oncology

## 2015-09-29 VITALS — BP 139/83 | HR 70 | Temp 98.3°F | Resp 18 | Ht 68.5 in | Wt 204.4 lb

## 2015-09-29 DIAGNOSIS — C50112 Malignant neoplasm of central portion of left female breast: Secondary | ICD-10-CM | POA: Diagnosis not present

## 2015-09-29 DIAGNOSIS — Z170 Estrogen receptor positive status [ER+]: Secondary | ICD-10-CM | POA: Diagnosis not present

## 2015-09-29 NOTE — Progress Notes (Signed)
[place]  [phone] [phone]     ID: Audrey Fischer DOB: [birth year]  MR#: [id number]  HUD#:[id number]  Patient Care Team: Hoyt Koch, MD as PCP - General (Internal Medicine) Chauncey Cruel, MD as Consulting Physician (Oncology) Autumn Messing III, MD as Consulting Physician (General Surgery) Kyung Rudd, MD as Consulting Physician (Radiation Oncology) Eldred Manges, MD as Consulting Physician (Obstetrics and Gynecology) Jerene Bears, MD as Consulting Physician (Gastroenterology) OTHER MD: Ronnette Hila MD  CHIEF COMPLAINT: estrogen receptor positive breast cancer  CURRENT TREATMENT: Awaiting definitive surgery   BREAST CANCER HISTORY: Audrey Fischer tells me after her husband's death in January 25, 2006 she neglected her own health and did not see a doctor for several years. More recently she had a menstrual period after thinking she was in menopause. This took her to Dr. Leo Grosser who is in the process of investigating the bleeding problems. She also updated Shilynn's health maintenance with mammography at [facility] obtained 07/02/2015. This showed a suspicious finding in the right breast and Nazanin was referred to the breast Center where on 07/07/2015 she had bilateral diagnostic mammography with tomosynthesis and right breast ultrasonography. The breast density was category D.   In the left breast there was a small mass in the anterior aspect of the lower outer quadrant. By tomography this was irregular and spiculated. On physical exam it measured approximately 1.2 cm in the 7:00 position immediately adjacent to the nipple. Ultrasound found a 1.3 cm hypoechoic mass in the area in question. There were also 3 small oval hypoechoic masses containing echogenic material, the largest measuring 4 mm, with a thin internal septation. Ultrasound of the left axilla was unremarkable.   biopsy of the left breast mass in question 07/09/2015 showed ([id number]) and  invasive ductal carcinoma, grade 1, estrogen receptor 100% positive, progesterone receptor 100% positive, with an MIB-1 of 10%, and no HER-2 amplification, the signals ratio being 1.62 and the number per cell 2.10.  The patient's subsequent history is as detailed below  INTERVAL HISTORY:  Audrey Fischer returns today for follow up of ehr estrogen receptor positive breast cancer. Since her last visit here she underwent a left central lumpectomy under [facility]. She tolerated the surgery well, with no significant pain, bleeding, or fever problems. At this call to check in the past and hopefully it will be ready later today or tomorrow at the latest  REVIEW OF SYSTEMS: She slept for about 4 days after the surgery but finally "woke up" yesterday. She is feeling like she has a little bit more energy now. Otherwise she denies unusual headaches, visual changes, nausea, vomiting, shortness of breath, chest pain or pressure, change in bowel or bladder habits, rash, or other worrisome symptoms. Occasionally she notices mild ankle swelling. A detailed review of systems was otherwise negative.  PAST MEDICAL HISTORY: Past Medical History  Diagnosis Date  . Hypertension   . Hyperlipidemia   . Breast cancer 07/09/15    left breast,  lower-inner quadrant   . Coronary artery disease     cardiologist-  dr Donnetta Hutching [facility], [facility]ology in [facility])---  hx PCI w/ stents to OM and LAD 02-08-2008  . S/P coronary artery stent placement   . PMB (postmenopausal bleeding)     PAST SURGICAL HISTORY: Past Surgical History  Procedure Laterality Date  . Cesarean section  10-09-1996  . Coronary angioplasty with stent placement  02-08-2008   dr Donnetta Hutching    PCI and  stent to OM and LAD  . Dilation and curettage of uterus  1994    blighted ovum  . Dilatation & currettage/hysteroscopy with resectocope N/A 08/15/2015    Procedure: DILATATION & CURETTAGE/HYSTEROSCOPY ;  Surgeon: Eldred Manges, MD;  Location:  [place];  Service: Gynecology;  Laterality: N/A;    FAMILY HISTORY Family History  Problem Relation Age of Onset  . Heart disease Mother   . Stroke Mother   . Colon cancer Mother 28  . Heart disease Father   The patient's father died at the age of 69 from a myocardial infarction. The patient's mother died at the age of 52, with a stroke. Both have significant diabetes problems. The patient has one brother, no sisters. On the mother's side 1 aunt had stomach cancer one cousin prostate cancer. The patient's own mother was diagnosed with colon cancer at age 37. There is no history of breast, ovarian, or uterine cancer in the family to the patient's knowledge.   GYNECOLOGIC HISTORY:  No LMP recorded. Patient is postmenopausal.  menarche age 44. First live birth age 86. The patient is GX P2. She never took hormone replacement. She did use oral contraceptives remotely, without complication  SOCIAL HISTORY:  Lanisha teaches middle school math. Her husband died from infectious complications following back surgery in 2007. Her children are Audrey Fischer who is a Equities trader at [employer] in [place], and Audrey Fischer, and freshman at [employer] with an eye to studying physical therapy the patient attends [place]: Not in place. At the initial clinic visit 09/29/2015 the patient was given the appropriate forms to complete and notarize at her discretion.   HEALTH MAINTENANCE: Social History  Substance Use Topics  . Smoking status: Never Smoker   . Smokeless tobacco: Never Used  . Alcohol Use: 0.0 oz/week    0 Standard drinks or equivalent per week     Comment: occasionally     Colonoscopy:07/21/2015 / [facility]  PAP: UTD/ [facility]  Bone density:  Lipid panel:  Allergies  Allergen Reactions  . Capsule [Gelatin] Hives    Any medication that is in gel capsule form    Current  Outpatient Prescriptions  Medication Sig Dispense Refill  . aspirin 81 MG tablet Take 81 mg by mouth daily.    Marland Kitchen atenolol (TENORMIN) 50 MG tablet Take 50 mg by mouth every evening.     . Cholecalciferol (VITAMIN D3) 2000 UNITS TABS Take 1 tablet by mouth daily.    . nitroGLYCERIN (NITROSTAT) 0.4 MG SL tablet Place 0.4 mg under the tongue.    . rosuvastatin (CRESTOR) 10 MG tablet Take 10 mg by mouth every evening.      No current facility-administered medications for this visit.    OBJECTIVE: middle-aged African-American woman in no acute distress Filed Vitals:   09/29/15 1139  BP: 139/83  Pulse: 70  Temp: 98.3 F (36.8 C)  Resp: 18     Body mass index is 30.62 kg/(m^2).    ECOG FS:1 - Symptomatic but completely ambulatory  Sclerae unicteric, pupils round and equal Oropharynx clear and moist-- no thrush or other lesions No cervical or supraclavicular adenopathy Lungs no rales or rhonchi Heart regular rate and rhythm Abd soft, nontender, positive bowel sounds MSK no focal spinal tenderness, no upper extremity lymphedema Neuro: nonfocal, well oriented, appropriate affect Breasts: The right breast is unremarkable. The left breast is status post central  lumpectomy and sentinel lymph node sampling. The incisions are healing nicely, with Steri-Strips still in place. There is some left breast edema, making it just about the same size as the right despite the recent surgery. The left axilla is benign  LAB RESULTS:  CMP     Component Value Date/Time   NA 142 07/30/2015 1620   K 4.0 07/30/2015 1620   CO2 23 07/30/2015 1620   GLUCOSE 125 07/30/2015 1620   BUN 11.2 07/30/2015 1620   CREATININE 1.4* 07/30/2015 1620   CALCIUM 9.7 07/30/2015 1620   PROT 7.3 07/30/2015 1620   ALBUMIN 4.0 07/30/2015 1620   AST 16 07/30/2015 1620   ALT 25 07/30/2015 1620   ALKPHOS 71 07/30/2015 1620   BILITOT 0.49 07/30/2015 1620    INo results found for: SPEP, UPEP  Lab Results  Component Value  Date   WBC 6.6 08/15/2015   NEUTROABS 3.5 07/30/2015   HGB 14.3 08/15/2015   HCT 42.7 08/15/2015   MCV 83.4 08/15/2015   PLT 183 08/15/2015      Chemistry      Component Value Date/Time   NA 142 07/30/2015 1620   K 4.0 07/30/2015 1620   CO2 23 07/30/2015 1620   BUN 11.2 07/30/2015 1620   CREATININE 1.4* 07/30/2015 1620      Component Value Date/Time   CALCIUM 9.7 07/30/2015 1620   ALKPHOS 71 07/30/2015 1620   AST 16 07/30/2015 1620   ALT 25 07/30/2015 1620   BILITOT 0.49 07/30/2015 1620       No results found for: LABCA2  No components found for: LABCA125  No results for input(s): INR in the last 168 hours.  Urinalysis No results found for: COLORURINE, APPEARANCEUR, LABSPEC, PHURINE, GLUCOSEU, HGBUR, BILIRUBINUR, KETONESUR, PROTEINUR, UROBILINOGEN, NITRITE, LEUKOCYTESUR  STUDIES: No results found.  ASSESSMENT: 59 y.o. [place], [place] Woman status post left breast biopsy 07/09/2015 for a clinical T1c N0, stage IA invasive ductal carcinoma, grade 1, estrogen and progesterone receptor strongly positive, HER-2 not amplified, with an MIB-1 of 10%   (1) status post left central mastectomy with nipple resection and excised sentinel lymph node sampling at [facility] 09/23/2015--pathology is pending  (2) Oncotype DX to be requested from the definitive surgical specimen  (3) adjuvant radiation to follow  (4) anti-estrogens to follow completion of local treatment  PLAN: Melena did well with her surgery, and we are waiting on the pathology results. She understands that if we have lymph node positivity she will need chemotherapy. However if the lymph nodes are negative we will request an Oncotype. That usually takes about 2 weeks to come back.  Accordingly I have made her an appointment with Dr. Lisbeth Renshaw for about 3 weeks from now, to begin discussing radiation, but I will call her sinus have the pathology report and then when I have the Oncotype report.  She is can to see  me in mid January. By then hopefully she will be pretty much done with radiation and will be ready to start anti-estrogens.  She knows to call for any problems that may develop before her next visit here. Chauncey Cruel, MD   09/29/2015 12:07 PM Medical Oncology and Hematology [facility] [address] [place], [place] [address] Tel. [phone]    Fax. [phone]

## 2015-10-02 ENCOUNTER — Ambulatory Visit: Admission: RE | Admit: 2015-10-02 | Payer: BC Managed Care – PPO | Source: Ambulatory Visit

## 2015-10-07 ENCOUNTER — Telehealth: Payer: Self-pay | Admitting: *Deleted

## 2015-10-07 NOTE — Telephone Encounter (Signed)
error 

## 2015-10-07 NOTE — Progress Notes (Addendum)
Follow up New Consult  Left Breast  Histology per Pathology Report:  From [facility] Date Type Department Care Team Description  09/23/2015 [facility] Encounter Outpatient Surgical Services - 1st fl [place], [place] [address]  [phone]  Algis Downs, [address]  [place], [place] [address]  [phone]  [phone] (Fax)  Infiltrating ductal carcinoma of left breast [facility]) (Primary Dx)  Surgery Details Date/Time Status Location OR Service Patient Class Case Class Case Type Trauma Case?  09/23/15 1:45 PM Posted [place] OUTPATIENT OR OR [facility] Outpatient Surgery Scheduled     Panel 1 Procedure LRB Anes Op Region Wound Class Comments  LEFT CENTRAL MASTECTOMY AND NIPPLE RESECTION. Left General Breast Clean  Ultrasound in the room   AXILLARY SENTINEL [place] Clean      Surgeon Surgeon Role Service Panel  Ivor Costa, MD Resident - Assisting  1  Algis Downs, MD Primary General 1   Special Needs  Ultrasound in [facility] 10/13SLN Mapping in [facility] 10/25 @ 10:00am     ACCESSION NUMBER: [id number]  RECEIVED: 09/04/2015  ORDERING PHYSICIAN:EDWARD Porfirio Mylar , MD  PATIENT NAME:Audrey Fischer, Audrey Fischer  SURGICAL PATHOLOGY REPORT        FINAL PATHOLOGIC DIAGNOSIS  MICROSCOPIC EXAMINATION PERFORMED AND SUPPORTS DIAGNOSIS      LEFT BREAST, 7 O,CLOCK SUBAREOLAR, NEEDLE CORE BIOPSY ([place] [phone], 07/09/2015):  Invasive ductal carcinoma.      COMMENT:  The tumor appears to be low grade on the biopsy. Correlation with  the resection specimen is necessary for final classification and  grading. Per outside report, the tumor is positive for ER (100%)  and PR (100%). Her2/neu is negative by FISH. Ki-67 is 10%.        Past/Anticipated interventions by surgeon, if any: Dr. Clovis Riley  has appt this Friday  1pm  Past/Anticipated interventions by medical oncology, if any: Chemotherapy : Dr. Jana Hakim seen 09/29/15,   Lymphedema issues, if any: none   Pain issues, if any:  sorness on left breast, draining, has bandaid over site,  has steri strips on breast, very swollen and sore  Current Complaints / other details: Widowed,     Rebecca Eaton, RN 10/07/2015,10:37 AM  BP 139/93 mmHg  Pulse 71  Temp(Src) 98.2 F (36.8 Fischer) (Oral)  Resp 20  Ht 5' 8.5" (1.74 m)  Wt 205 lb (92.987 kg)  BMI 30.71 kg/m2  Wt Readings from Last 3 Encounters:  10/08/15 205 lb (92.987 kg)  09/29/15 204 lb 6.4 oz (92.715 kg)  08/26/15 204 lb (92.534 kg)

## 2015-10-08 ENCOUNTER — Ambulatory Visit
Admission: RE | Admit: 2015-10-08 | Discharge: 2015-10-08 | Disposition: A | Discharge status: Home / Self Care | Payer: BC Managed Care – PPO | Source: Ambulatory Visit | Attending: Radiation Oncology | Admitting: Radiation Oncology

## 2015-10-08 ENCOUNTER — Encounter: Payer: Self-pay | Admitting: Radiation Oncology

## 2015-10-08 VITALS — BP 139/93 | HR 71 | Temp 98.2°F | Resp 20 | Ht 68.5 in | Wt 205.0 lb

## 2015-10-08 DIAGNOSIS — C50112 Malignant neoplasm of central portion of left female breast: Secondary | ICD-10-CM

## 2015-10-08 DIAGNOSIS — C50019 Malignant neoplasm of nipple and areola, unspecified female breast: Secondary | ICD-10-CM | POA: Diagnosis not present

## 2015-10-08 NOTE — Progress Notes (Signed)
Radiation Oncology         [phone]) [phone] ________________________________  Name: Audrey Fischer MRN: [id number]  Date: 10/08/2015  DOB: 02/01/1956  Follow-Up Visit Note  CC: Hoyt Koch, MD  Magrinat, Virgie Dad, MD  Diagnosis: Invasive Ductal Carcinoma of the Left Breast, Grade II, Stage T1cN0M0  No diagnosis found.  Narrative:  The patient returns today for routine follow-up. Since her last visit, she underwent a left central lumpectomy with nipple resection and excised sentinel lymph node biopsy under Dr. Ronnette Hila at [facility] on 09/23/15. The tumor was grade 2 invasive ductal carcinoma. It measured 1.6 x 1.3 x 1.0 cm; ER (100%), PR (100%), Her2/neu negative, and  Ki-67 is 10%. The margins were negative for malignancy. Two left axillary sentinel lymph nodes were also biopsied and are negative for malignancy. She saw Dr. Jana Hakim on 09/23/15. Oncotype DX was requested per Dr. Jana Hakim. The patient presents to me today for the consideration of radiotherapy for the management of her disease.  ALLERGIES:  is allergic to capsule.  Meds: Current Outpatient Prescriptions  Medication Sig Dispense Refill  . aspirin 81 MG tablet Take 81 mg by mouth daily.    Marland Kitchen atenolol (TENORMIN) 50 MG tablet Take 50 mg by mouth every evening.     . Cholecalciferol (VITAMIN D3) 2000 UNITS TABS Take 1 tablet by mouth daily.    . rosuvastatin (CRESTOR) 10 MG tablet Take 10 mg by mouth every evening.     . nitroGLYCERIN (NITROSTAT) 0.4 MG SL tablet Place 0.4 mg under the tongue.     No current facility-administered medications for this encounter.    Physical Findings: The patient is in no acute distress. Patient is alert and oriented.  height is 5' 8.5" (1.74 m) and weight is 205 lb (92.987 kg). Her oral temperature is 98.2 F (36.8 C). Her blood pressure is 139/93 and her pulse is 71. Her respiration is 20.  Pt has steri-strips present along the surgical incision of the left breast. Healing well,  but one area of irritation that bleed a little just lateral of midline along the incision.  Lab Findings: Lab Results  Component Value Date   WBC 6.6 08/15/2015   WBC 5.5 07/30/2015   HGB 14.3 08/15/2015   HGB 14.0 07/30/2015   HCT 42.7 08/15/2015   HCT 40.4 07/30/2015   PLT 183 08/15/2015   PLT 194 07/30/2015    Lab Results  Component Value Date   NA 142 07/30/2015   K 4.0 07/30/2015   CHLORIDE 110* 07/30/2015   CO2 23 07/30/2015   GLUCOSE 125 07/30/2015   BUN 11.2 07/30/2015   CREATININE 1.4* 07/30/2015   BILITOT 0.49 07/30/2015   ALKPHOS 71 07/30/2015   AST 16 07/30/2015   ALT 25 07/30/2015   PROT 7.3 07/30/2015   ALBUMIN 4.0 07/30/2015   CALCIUM 9.7 07/30/2015   ANIONGAP 9 07/30/2015    Radiographic Findings: No results found.  Impression:  Pt is status post lumpectomy for Invasive Ductal Carcinoma of the Left Breast, Grade II, Stage T1cN0M0. The pt presents fairly early after surgery, but is appearing satisfactorily. Awaiting results from oncotype test to clarify the role of chemotherapy.  Plan: She has an appointment scheduled with Dr. Clovis Riley this Friday at [place] [facility]. The pt is awaiting final decision on chemotherapy per Dr. Jana Hakim. Once the decision is made, we will proceed with planning for radiation treatment accordingly. The pt understands that adjuvant radiation treatment is recommended at the appropriate time. _____________________________________  Jodelle Gross, MD, PhD  This document serves as a record of services personally performed by Kyung Rudd, MD. It was created on his behalf by Darcus Austin, a trained medical scribe. The creation of this record is based on the scribe's personal observations and the provider's statements to them. This document has been checked and approved by the attending provider.

## 2015-10-08 NOTE — Progress Notes (Signed)
Please see the Nurse Progress Note in the MD Initial Consult Encounter for this patient. 

## 2015-10-09 ENCOUNTER — Encounter: Payer: Self-pay | Admitting: Radiation Oncology

## 2015-10-10 ENCOUNTER — Encounter: Payer: Self-pay | Admitting: Radiation Oncology

## 2015-10-27 ENCOUNTER — Telehealth: Payer: Self-pay | Admitting: *Deleted

## 2015-10-27 NOTE — Telephone Encounter (Signed)
Received order from Dr. Jana Hakim for oncotype testing. Requisition sent to pathology. Received by Alyse Low. PAC sent to [facility]

## 2015-11-14 ENCOUNTER — Telehealth: Payer: Self-pay | Admitting: *Deleted

## 2015-11-14 ENCOUNTER — Encounter: Payer: Self-pay | Admitting: *Deleted

## 2015-11-14 NOTE — Telephone Encounter (Signed)
Called pt with oncotype score of 11. Discussed next steps. Denies further needs at this time.

## 2015-11-14 NOTE — Progress Notes (Signed)
Received oncotype score of 11/7%. Physician team notified.

## 2015-11-18 ENCOUNTER — Encounter (HOSPITAL_COMMUNITY): Payer: Self-pay

## 2015-12-08 ENCOUNTER — Ambulatory Visit: Payer: BC Managed Care – PPO | Admitting: Radiation Oncology

## 2015-12-08 ENCOUNTER — Ambulatory Visit
Admission: RE | Admit: 2015-12-08 | Discharge: 2015-12-08 | Disposition: A | Discharge status: Home / Self Care | Payer: BC Managed Care – PPO | Source: Ambulatory Visit | Attending: Radiation Oncology | Admitting: Radiation Oncology

## 2015-12-08 DIAGNOSIS — Z7982 Long term (current) use of aspirin: Secondary | ICD-10-CM | POA: Insufficient documentation

## 2015-12-08 DIAGNOSIS — Z510 Encounter for antineoplastic radiation therapy: Secondary | ICD-10-CM | POA: Insufficient documentation

## 2015-12-08 DIAGNOSIS — C50112 Malignant neoplasm of central portion of left female breast: Secondary | ICD-10-CM | POA: Insufficient documentation

## 2015-12-10 ENCOUNTER — Ambulatory Visit
Admission: RE | Admit: 2015-12-10 | Discharge: 2015-12-10 | Disposition: A | Discharge status: Home / Self Care | Payer: BC Managed Care – PPO | Source: Ambulatory Visit | Attending: Radiation Oncology | Admitting: Radiation Oncology

## 2015-12-10 DIAGNOSIS — Z7982 Long term (current) use of aspirin: Secondary | ICD-10-CM | POA: Diagnosis not present

## 2015-12-10 DIAGNOSIS — C50112 Malignant neoplasm of central portion of left female breast: Secondary | ICD-10-CM

## 2015-12-10 DIAGNOSIS — Z510 Encounter for antineoplastic radiation therapy: Secondary | ICD-10-CM | POA: Diagnosis present

## 2015-12-10 NOTE — Progress Notes (Addendum)
  Radiation Oncology         [phone]) [phone] ________________________________  Name: Audrey Fischer MRN: [id number]  Date: 12/10/2015  DOB: Feb 11, 1956  DIAGNOSIS:     ICD-9-CM ICD-10-CM   1. Cancer of central portion of left breast (HCC) 174.1 C50.112      SIMULATION AND TREATMENT PLANNING NOTE  The patient presented for simulation prior to beginning her course of radiation treatment for her diagnosis of left-sided breast cancer. The patient was placed in a supine position on a breast board. A customized vac-lock bag was constructed and this complex treatment device will be used on a daily basis during her treatment. In this fashion, a CT scan was obtained through the chest area and an isocenter was placed near the chest wall within the breast.  The patient will be planned to receive a course of radiation initially to a dose of 50.4 Gy. This will consist of a whole breast radiotherapy technique. To accomplish this, 2 customized blocks have been designed which will correspond to medial and lateral whole breast tangent fields. This treatment will be accomplished at 1.8 Gy per fraction. A forward planning technique will also be evaluated to determine if this approach improves the plan. It is anticipated that the patient will then receive a 10 Gy boost to the seroma cavity which has been contoured. This will be accomplished at 2 Gy per fraction.   This initial treatment will consist of a 3-D conformal technique. The seroma has been contoured as the primary target structure. Additionally, dose volume histograms of both this target as well as the lungs and heart will also be evaluated. Such an approach is necessary to ensure that the target area is adequately covered while the nearby critical  normal structures are adequately spared.  Plan:  The final anticipated total dose therefore will correspond to 60.4 Gy.    Special treatment procedure Special treatment procedure was performed today due to the  extra time and effort required by myself to plan and prepare this patient for deep inspiration breath hold technique.  I have determined cardiac sparing to be of benefit to this patient to prevent long term cardiac damage due to radiation of the heart.  Bellows were placed on the patient's abdomen. To facilitate cardiac sparing, the patient was coached by the radiation therapists on breath hold techniques and breathing practice was performed. Practice waveforms were obtained. The patient was then scanned while maintaining breath hold in the treatment position.  This image was then transferred over to the imaging specialist. The imaging specialist then created a fusion of the free breathing and breath hold scans using the chest wall as the stable structure. I personally reviewed the fusion in axial, coronal and sagittal image planes.  Excellent cardiac sparing was obtained.  I felt the patient is an appropriate candidate for breath hold and the patient will be treated as such.  The image fusion was then reviewed with the patient to reinforce the necessity of reproducible breath hold.      _______________________________   Jodelle Gross, MD, PhD

## 2015-12-11 DIAGNOSIS — Z510 Encounter for antineoplastic radiation therapy: Secondary | ICD-10-CM | POA: Diagnosis not present

## 2015-12-12 ENCOUNTER — Other Ambulatory Visit: Payer: Self-pay

## 2015-12-12 DIAGNOSIS — Z510 Encounter for antineoplastic radiation therapy: Secondary | ICD-10-CM | POA: Diagnosis not present

## 2015-12-12 DIAGNOSIS — C50112 Malignant neoplasm of central portion of left female breast: Secondary | ICD-10-CM

## 2015-12-15 ENCOUNTER — Ambulatory Visit: Payer: BC Managed Care – PPO | Admitting: Radiation Oncology

## 2015-12-15 ENCOUNTER — Other Ambulatory Visit: Payer: BC Managed Care – PPO

## 2015-12-15 ENCOUNTER — Ambulatory Visit: Payer: BC Managed Care – PPO | Admitting: Oncology

## 2015-12-16 ENCOUNTER — Ambulatory Visit: Payer: BC Managed Care – PPO

## 2015-12-17 ENCOUNTER — Ambulatory Visit: Payer: BC Managed Care – PPO

## 2015-12-17 ENCOUNTER — Ambulatory Visit: Payer: BC Managed Care – PPO | Admitting: Radiation Oncology

## 2015-12-18 ENCOUNTER — Ambulatory Visit: Payer: BC Managed Care – PPO

## 2015-12-19 ENCOUNTER — Ambulatory Visit: Payer: BC Managed Care – PPO

## 2015-12-20 ENCOUNTER — Ambulatory Visit: Payer: BC Managed Care – PPO

## 2015-12-22 ENCOUNTER — Ambulatory Visit: Payer: BC Managed Care – PPO

## 2015-12-22 ENCOUNTER — Ambulatory Visit
Admission: RE | Admit: 2015-12-22 | Discharge: 2015-12-22 | Disposition: A | Discharge status: Home / Self Care | Payer: BC Managed Care – PPO | Source: Ambulatory Visit | Attending: Radiation Oncology | Admitting: Radiation Oncology

## 2015-12-22 DIAGNOSIS — Z510 Encounter for antineoplastic radiation therapy: Secondary | ICD-10-CM | POA: Diagnosis not present

## 2015-12-23 ENCOUNTER — Ambulatory Visit: Payer: BC Managed Care – PPO

## 2015-12-23 ENCOUNTER — Ambulatory Visit
Admission: RE | Admit: 2015-12-23 | Discharge: 2015-12-23 | Disposition: A | Discharge status: Home / Self Care | Payer: BC Managed Care – PPO | Source: Ambulatory Visit | Attending: Radiation Oncology | Admitting: Radiation Oncology

## 2015-12-23 DIAGNOSIS — C50112 Malignant neoplasm of central portion of left female breast: Secondary | ICD-10-CM

## 2015-12-23 DIAGNOSIS — Z510 Encounter for antineoplastic radiation therapy: Secondary | ICD-10-CM | POA: Diagnosis not present

## 2015-12-23 NOTE — Progress Notes (Signed)
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

## 2015-12-24 ENCOUNTER — Ambulatory Visit: Payer: BC Managed Care – PPO

## 2015-12-24 ENCOUNTER — Ambulatory Visit
Admission: RE | Admit: 2015-12-24 | Discharge: 2015-12-24 | Disposition: A | Discharge status: Home / Self Care | Payer: BC Managed Care – PPO | Source: Ambulatory Visit | Attending: Radiation Oncology | Admitting: Radiation Oncology

## 2015-12-24 DIAGNOSIS — Z510 Encounter for antineoplastic radiation therapy: Secondary | ICD-10-CM | POA: Diagnosis not present

## 2015-12-25 ENCOUNTER — Ambulatory Visit
Admission: RE | Admit: 2015-12-25 | Discharge: 2015-12-25 | Disposition: A | Discharge status: Home / Self Care | Payer: BC Managed Care – PPO | Source: Ambulatory Visit | Attending: Radiation Oncology | Admitting: Radiation Oncology

## 2015-12-25 ENCOUNTER — Ambulatory Visit: Payer: BC Managed Care – PPO

## 2015-12-25 ENCOUNTER — Telehealth: Payer: Self-pay | Admitting: *Deleted

## 2015-12-25 DIAGNOSIS — Z510 Encounter for antineoplastic radiation therapy: Secondary | ICD-10-CM | POA: Diagnosis not present

## 2015-12-25 NOTE — Telephone Encounter (Signed)
Left message for a return phone call to follow up after start of radiation.   

## 2015-12-26 ENCOUNTER — Ambulatory Visit
Admission: RE | Admit: 2015-12-26 | Discharge: 2015-12-26 | Disposition: A | Discharge status: Home / Self Care | Payer: BC Managed Care – PPO | Source: Ambulatory Visit | Attending: Radiation Oncology | Admitting: Radiation Oncology

## 2015-12-26 ENCOUNTER — Ambulatory Visit: Payer: BC Managed Care – PPO

## 2015-12-26 ENCOUNTER — Encounter: Payer: Self-pay | Admitting: Radiation Oncology

## 2015-12-26 VITALS — BP 147/89 | HR 65 | Temp 97.5°F | Resp 20 | Wt 210.0 lb

## 2015-12-26 DIAGNOSIS — Z510 Encounter for antineoplastic radiation therapy: Secondary | ICD-10-CM | POA: Diagnosis not present

## 2015-12-26 DIAGNOSIS — C50112 Malignant neoplasm of central portion of left female breast: Secondary | ICD-10-CM

## 2015-12-26 NOTE — Progress Notes (Signed)
   Department of Radiation Oncology  Phone:  [phone] Fax:        [phone]  Weekly Treatment Note    Name: Audrey Fischer Date: 12/26/2015 MRN: [id number] DOB: May 15, 1956   Diagnosis:     ICD-9-CM ICD-10-CM   1. Cancer of central portion of left breast (HCC) 174.1 C50.112      Current dose: 7.2 Gy  Current fraction: 4   MEDICATIONS: Current Outpatient Prescriptions  Medication Sig Dispense Refill  . aspirin 81 MG tablet Take 81 mg by mouth daily.    Marland Kitchen atenolol (TENORMIN) 50 MG tablet Take 50 mg by mouth every evening.     . Cholecalciferol (VITAMIN D3) 2000 UNITS TABS Take 1 tablet by mouth daily.    . hyaluronate sodium (RADIAPLEXRX) GEL Apply 1 application topically 2 (two) times daily.    . non-metallic deodorant Jethro Poling) MISC Apply 1 application topically daily as needed.    . rosuvastatin (CRESTOR) 10 MG tablet Take 10 mg by mouth every evening.     . nitroGLYCERIN (NITROSTAT) 0.4 MG SL tablet Place 0.4 mg under the tongue. Reported on 12/26/2015     No current facility-administered medications for this encounter.     ALLERGIES: Capsule   LABORATORY DATA:  Lab Results  Component Value Date   WBC 6.6 08/15/2015   HGB 14.3 08/15/2015   HCT 42.7 08/15/2015   MCV 83.4 08/15/2015   PLT 183 08/15/2015   Lab Results  Component Value Date   NA 142 07/30/2015   K 4.0 07/30/2015   CO2 23 07/30/2015   Lab Results  Component Value Date   ALT 25 07/30/2015   AST 16 07/30/2015   ALKPHOS 71 07/30/2015   BILITOT 0.49 07/30/2015     NARRATIVE: Audrey Fischer was seen today for weekly treatment management. The chart was checked and the patient's films were reviewed.  Weekly rad txs, left breast  4/33 completed, left breast has leakage   At nipple area where incision is,Clear,  styarted back drainage when she wasn't wearing her bra at night, using dressing  Over site, appetite good, occasional twinges in left breast 9:07 AM BP 147/89 mmHg  Pulse 65  Temp(Src)  97.5 F (36.4 C) (Oral)  Resp 20  Wt 210 lb (95.255 kg)    Wt Readings from Last 3 Encounters:  12/26/15 210 lb (95.255 kg)  10/08/15 205 lb (92.987 kg)  09/29/15 204 lb 6.4 oz (92.715 kg)     PHYSICAL EXAMINATION: weight is 210 lb (95.255 kg). Her oral temperature is 97.5 F (36.4 C). Her blood pressure is 147/89 and her pulse is 65. Her respiration is 20.      slight drainage from the medial aspect of the scar. No blood/sign of infection present  ASSESSMENT: The patient is doing satisfactorily with treatment.  PLAN: We will continue with the patient's radiation treatment as planned. Patient is doing satisfactorily at this time. We will continue monitoring the drainage from the scar. She is far removed from her surgery so I would not anticipate any difficulties in terms of wound healing. She had her surgery on 09/23/2015.

## 2015-12-26 NOTE — Progress Notes (Signed)
Weekly rad txs, left breast  4/33 completed, left breast has leakage   At nipple area where incision is,Clear,  styarted back drainage when she wasn't wearing her bra at night, using dressing  Over site, appetite good, occasional twinges in left breast 8:44 AM BP 147/89 mmHg  Pulse 65  Temp(Src) 97.5 F (36.4 C) (Oral)  Resp 20  Wt 210 lb (95.255 kg)    Wt Readings from Last 3 Encounters:  12/26/15 210 lb (95.255 kg)  10/08/15 205 lb (92.987 kg)  09/29/15 204 lb 6.4 oz (92.715 kg)

## 2015-12-27 ENCOUNTER — Ambulatory Visit: Payer: BC Managed Care – PPO

## 2015-12-29 ENCOUNTER — Ambulatory Visit
Admission: RE | Admit: 2015-12-29 | Discharge: 2015-12-29 | Disposition: A | Discharge status: Home / Self Care | Payer: BC Managed Care – PPO | Source: Ambulatory Visit | Attending: Radiation Oncology | Admitting: Radiation Oncology

## 2015-12-29 DIAGNOSIS — Z510 Encounter for antineoplastic radiation therapy: Secondary | ICD-10-CM | POA: Diagnosis not present

## 2015-12-30 ENCOUNTER — Ambulatory Visit
Admission: RE | Admit: 2015-12-30 | Discharge: 2015-12-30 | Disposition: A | Discharge status: Home / Self Care | Payer: BC Managed Care – PPO | Source: Ambulatory Visit | Attending: Radiation Oncology | Admitting: Radiation Oncology

## 2015-12-30 DIAGNOSIS — C50019 Malignant neoplasm of nipple and areola, unspecified female breast: Secondary | ICD-10-CM | POA: Insufficient documentation

## 2015-12-30 DIAGNOSIS — Z79899 Other long term (current) drug therapy: Secondary | ICD-10-CM | POA: Diagnosis not present

## 2015-12-30 DIAGNOSIS — I10 Essential (primary) hypertension: Secondary | ICD-10-CM | POA: Diagnosis not present

## 2015-12-30 DIAGNOSIS — Z7982 Long term (current) use of aspirin: Secondary | ICD-10-CM | POA: Diagnosis not present

## 2015-12-30 DIAGNOSIS — Z955 Presence of coronary angioplasty implant and graft: Secondary | ICD-10-CM | POA: Insufficient documentation

## 2015-12-30 DIAGNOSIS — E785 Hyperlipidemia, unspecified: Secondary | ICD-10-CM | POA: Diagnosis not present

## 2015-12-31 ENCOUNTER — Ambulatory Visit
Admission: RE | Admit: 2015-12-31 | Discharge: 2015-12-31 | Disposition: A | Discharge status: Home / Self Care | Payer: BC Managed Care – PPO | Source: Ambulatory Visit | Attending: Radiation Oncology | Admitting: Radiation Oncology

## 2015-12-31 DIAGNOSIS — Z510 Encounter for antineoplastic radiation therapy: Secondary | ICD-10-CM | POA: Diagnosis present

## 2015-12-31 DIAGNOSIS — C50112 Malignant neoplasm of central portion of left female breast: Secondary | ICD-10-CM | POA: Insufficient documentation

## 2016-01-01 ENCOUNTER — Ambulatory Visit
Admission: RE | Admit: 2016-01-01 | Discharge: 2016-01-01 | Disposition: A | Discharge status: Home / Self Care | Payer: BC Managed Care – PPO | Source: Ambulatory Visit | Attending: Radiation Oncology | Admitting: Radiation Oncology

## 2016-01-01 DIAGNOSIS — Z510 Encounter for antineoplastic radiation therapy: Secondary | ICD-10-CM | POA: Diagnosis not present

## 2016-01-02 ENCOUNTER — Ambulatory Visit
Admission: RE | Admit: 2016-01-02 | Discharge: 2016-01-02 | Disposition: A | Discharge status: Home / Self Care | Payer: BC Managed Care – PPO | Source: Ambulatory Visit | Attending: Radiation Oncology | Admitting: Radiation Oncology

## 2016-01-02 ENCOUNTER — Encounter: Payer: Self-pay | Admitting: Radiation Oncology

## 2016-01-02 VITALS — BP 139/85 | HR 60 | Temp 97.8°F | Resp 16 | Wt 209.4 lb

## 2016-01-02 DIAGNOSIS — C50112 Malignant neoplasm of central portion of left female breast: Secondary | ICD-10-CM

## 2016-01-02 DIAGNOSIS — Z510 Encounter for antineoplastic radiation therapy: Secondary | ICD-10-CM | POA: Diagnosis not present

## 2016-01-02 NOTE — Patient Instructions (Signed)
Contact our office if you have any questions following today's appointment: [phone].  

## 2016-01-02 NOTE — Progress Notes (Signed)
   Department of Radiation Oncology  Phone:  [phone] Fax:        [phone]  Weekly Treatment Note    Name: Audrey Fischer Date: 01/02/2016 MRN: [id number] DOB: [birth year]   Diagnosis:     ICD-9-CM ICD-10-CM   1. Cancer of central portion of left breast (HCC) 174.1 C50.112      Current dose: 16.2 Gy  Current fraction: 9/33   MEDICATIONS: Current Outpatient Prescriptions  Medication Sig Dispense Refill  . aspirin 81 MG tablet Take 81 mg by mouth daily.    Marland Kitchen atenolol (TENORMIN) 50 MG tablet Take 50 mg by mouth every evening.     . Cholecalciferol (VITAMIN D3) 2000 UNITS TABS Take 1 tablet by mouth daily.    . hyaluronate sodium (RADIAPLEXRX) GEL Apply 1 application topically 2 (two) times daily.    . nitroGLYCERIN (NITROSTAT) 0.4 MG SL tablet Place 0.4 mg under the tongue. Reported on Q000111Q    . non-metallic deodorant Jethro Poling) MISC Apply 1 application topically daily as needed.    . rosuvastatin (CRESTOR) 10 MG tablet Take 10 mg by mouth every evening.      No current facility-administered medications for this encounter.     ALLERGIES: Capsule   LABORATORY DATA:  Lab Results  Component Value Date   WBC 6.6 08/15/2015   HGB 14.3 08/15/2015   HCT 42.7 08/15/2015   MCV 83.4 08/15/2015   PLT 183 08/15/2015   Lab Results  Component Value Date   NA 142 07/30/2015   K 4.0 07/30/2015   CO2 23 07/30/2015   Lab Results  Component Value Date   ALT 25 07/30/2015   AST 16 07/30/2015   ALKPHOS 71 07/30/2015   BILITOT 0.49 07/30/2015     NARRATIVE: Audrey Fischer was seen today for weekly treatment management. The chart was checked and the patient's films were reviewed.   Overall the patient reports that she is doing very well and states that she is not experiencing any significant skin changes. She is using radial reflects twice daily without any difficulty. She denies any trouble with abdominal pain, chest pain, shortness of breath, fevers or chills. No other  complaints or verbalized.  PHYSICAL EXAMINATION: weight is 209 lb 6.4 oz (94.983 kg). Her temperature is 97.8 F (36.6 C). Her blood pressure is 139/85 and her pulse is 60. Her respiration is 16.       pain scale 0/10  In general this is a well-appearing female in no acute distress. She is alert and oriented times for appropriate examination. Cardiopulmonary assessment reveals normal effort without distress. The left breast is examined in postoperative changes are noted inferior to the nipple in into the left axilla. No evidence of erythema , or significant skin irritation is noted. Minimal hyperpigmentation is noted around the nipple itself , and are normal.  ASSESSMENT: The patient is doing satisfactorily with treatment.  PLAN: We will continue with the patient's radiation treatment as planned.  Patient will continue her daily symptoms that she is experiencing prior to her next visit.   Jodelle Gross, MD, PhD

## 2016-01-02 NOTE — Progress Notes (Signed)
wekly rad txs left breast, 9 completed ,  no skin changes, skin intact, uses radaiplex bid, no pain, appetite good BP 139/85 mmHg  Pulse 60  Temp(Src) 97.8 F (36.6 C)  Resp 16  Wt 209 lb 6.4 oz (94.983 kg)  Wt Readings from Last 3 Encounters:  01/02/16 209 lb 6.4 oz (94.983 kg)  12/26/15 210 lb (95.255 kg)  10/08/15 205 lb (92.987 kg)

## 2016-01-03 ENCOUNTER — Ambulatory Visit: Payer: BC Managed Care – PPO

## 2016-01-05 ENCOUNTER — Ambulatory Visit
Admission: RE | Admit: 2016-01-05 | Discharge: 2016-01-05 | Disposition: A | Discharge status: Home / Self Care | Payer: BC Managed Care – PPO | Source: Ambulatory Visit | Attending: Radiation Oncology | Admitting: Radiation Oncology

## 2016-01-05 DIAGNOSIS — Z510 Encounter for antineoplastic radiation therapy: Secondary | ICD-10-CM | POA: Diagnosis not present

## 2016-01-06 ENCOUNTER — Ambulatory Visit
Admission: RE | Admit: 2016-01-06 | Discharge: 2016-01-06 | Disposition: A | Discharge status: Home / Self Care | Payer: BC Managed Care – PPO | Source: Ambulatory Visit | Attending: Radiation Oncology | Admitting: Radiation Oncology

## 2016-01-06 DIAGNOSIS — Z510 Encounter for antineoplastic radiation therapy: Secondary | ICD-10-CM | POA: Diagnosis not present

## 2016-01-07 ENCOUNTER — Ambulatory Visit
Admission: RE | Admit: 2016-01-07 | Discharge: 2016-01-07 | Disposition: A | Discharge status: Home / Self Care | Payer: BC Managed Care – PPO | Source: Ambulatory Visit | Attending: Radiation Oncology | Admitting: Radiation Oncology

## 2016-01-07 DIAGNOSIS — Z510 Encounter for antineoplastic radiation therapy: Secondary | ICD-10-CM | POA: Diagnosis not present

## 2016-01-08 ENCOUNTER — Ambulatory Visit
Admission: RE | Admit: 2016-01-08 | Discharge: 2016-01-08 | Disposition: A | Discharge status: Home / Self Care | Payer: BC Managed Care – PPO | Source: Ambulatory Visit | Attending: Radiation Oncology | Admitting: Radiation Oncology

## 2016-01-08 DIAGNOSIS — Z510 Encounter for antineoplastic radiation therapy: Secondary | ICD-10-CM | POA: Diagnosis not present

## 2016-01-09 ENCOUNTER — Ambulatory Visit
Admission: RE | Admit: 2016-01-09 | Discharge: 2016-01-09 | Disposition: A | Discharge status: Home / Self Care | Payer: BC Managed Care – PPO | Source: Ambulatory Visit | Attending: Radiation Oncology | Admitting: Radiation Oncology

## 2016-01-09 DIAGNOSIS — Z510 Encounter for antineoplastic radiation therapy: Secondary | ICD-10-CM | POA: Diagnosis not present

## 2016-01-09 DIAGNOSIS — C50112 Malignant neoplasm of central portion of left female breast: Secondary | ICD-10-CM

## 2016-01-09 NOTE — Progress Notes (Signed)
   Department of Radiation Oncology  Phone:  [phone] Fax:        [phone]  Weekly Treatment Note    Name: Audrey Fischer Date: 01/09/2016 MRN: [id number] DOB: December 29, 1955   Diagnosis:     ICD-9-CM ICD-10-CM   1. Cancer of central portion of left breast (HCC) 174.1 C50.112      Current dose: 25.2 Gy  Current fraction: 14   MEDICATIONS: Current Outpatient Prescriptions  Medication Sig Dispense Refill  . aspirin 81 MG tablet Take 81 mg by mouth daily.    . Cholecalciferol (VITAMIN D3) 2000 UNITS TABS Take 1 tablet by mouth daily.    . hyaluronate sodium (RADIAPLEXRX) GEL Apply 1 application topically 2 (two) times daily.    . non-metallic deodorant Jethro Poling) MISC Apply 1 application topically daily as needed.    . rosuvastatin (CRESTOR) 10 MG tablet Take 10 mg by mouth every evening.     Marland Kitchen atenolol (TENORMIN) 50 MG tablet Take 50 mg by mouth every evening.     . nitroGLYCERIN (NITROSTAT) 0.4 MG SL tablet Place 0.4 mg under the tongue. Reported on 01/09/2016     No current facility-administered medications for this encounter.     ALLERGIES: Capsule   LABORATORY DATA:  Lab Results  Component Value Date   WBC 6.6 08/15/2015   HGB 14.3 08/15/2015   HCT 42.7 08/15/2015   MCV 83.4 08/15/2015   PLT 183 08/15/2015   Lab Results  Component Value Date   NA 142 07/30/2015   K 4.0 07/30/2015   CO2 23 07/30/2015   Lab Results  Component Value Date   ALT 25 07/30/2015   AST 16 07/30/2015   ALKPHOS 71 07/30/2015   BILITOT 0.49 07/30/2015     NARRATIVE: Audrey Fischer was seen today for weekly treatment management. The chart was checked and the patient's films were reviewed.  Weekly rad txs  Left breast 14/33 completed, skin dry around nipple, intact, uses radiaplex bid, no pain, no fatigue, appetite good 9:05 AM Wt Readings from Last 3 Encounters:  01/02/16 209 lb 6.4 oz (94.983 kg)  12/26/15 210 lb (95.255 kg)  10/08/15 205 lb (92.987 kg)    PHYSICAL  EXAMINATION: vitals were not taken for this visit.     the patient's skin shows mild hyperpigmentation diffusely. No desquamation.  ASSESSMENT: The patient is doing satisfactorily with treatment.  PLAN: We will continue with the patient's radiation treatment as planned.

## 2016-01-09 NOTE — Progress Notes (Signed)
Weekly rad txs  Left breast 14/33 completed, skin dry around nipple, intact, uses radiaplex bid, no pain, no fatigue, appetite good 8:58 AM Wt Readings from Last 3 Encounters:  01/02/16 209 lb 6.4 oz (94.983 kg)  12/26/15 210 lb (95.255 kg)  10/08/15 205 lb (92.987 kg)

## 2016-01-12 ENCOUNTER — Ambulatory Visit
Admission: RE | Admit: 2016-01-12 | Discharge: 2016-01-12 | Disposition: A | Discharge status: Home / Self Care | Payer: BC Managed Care – PPO | Source: Ambulatory Visit | Attending: Radiation Oncology | Admitting: Radiation Oncology

## 2016-01-12 DIAGNOSIS — Z510 Encounter for antineoplastic radiation therapy: Secondary | ICD-10-CM | POA: Diagnosis not present

## 2016-01-13 ENCOUNTER — Ambulatory Visit
Admission: RE | Admit: 2016-01-13 | Discharge: 2016-01-13 | Disposition: A | Discharge status: Home / Self Care | Payer: BC Managed Care – PPO | Source: Ambulatory Visit | Attending: Radiation Oncology | Admitting: Radiation Oncology

## 2016-01-13 DIAGNOSIS — Z510 Encounter for antineoplastic radiation therapy: Secondary | ICD-10-CM | POA: Diagnosis not present

## 2016-01-14 ENCOUNTER — Ambulatory Visit
Admission: RE | Admit: 2016-01-14 | Discharge: 2016-01-14 | Disposition: A | Discharge status: Home / Self Care | Payer: BC Managed Care – PPO | Source: Ambulatory Visit | Attending: Radiation Oncology | Admitting: Radiation Oncology

## 2016-01-14 DIAGNOSIS — Z510 Encounter for antineoplastic radiation therapy: Secondary | ICD-10-CM | POA: Diagnosis not present

## 2016-01-15 ENCOUNTER — Ambulatory Visit
Admission: RE | Admit: 2016-01-15 | Discharge: 2016-01-15 | Disposition: A | Discharge status: Home / Self Care | Payer: BC Managed Care – PPO | Source: Ambulatory Visit | Attending: Radiation Oncology | Admitting: Radiation Oncology

## 2016-01-15 DIAGNOSIS — Z510 Encounter for antineoplastic radiation therapy: Secondary | ICD-10-CM | POA: Diagnosis not present

## 2016-01-16 ENCOUNTER — Ambulatory Visit
Admission: RE | Admit: 2016-01-16 | Discharge: 2016-01-16 | Disposition: A | Discharge status: Home / Self Care | Payer: BC Managed Care – PPO | Source: Ambulatory Visit | Attending: Radiation Oncology | Admitting: Radiation Oncology

## 2016-01-16 ENCOUNTER — Encounter: Payer: Self-pay | Admitting: Radiation Oncology

## 2016-01-16 VITALS — BP 147/52 | HR 59 | Temp 97.8°F | Resp 20 | Wt 209.1 lb

## 2016-01-16 DIAGNOSIS — C50112 Malignant neoplasm of central portion of left female breast: Secondary | ICD-10-CM

## 2016-01-16 DIAGNOSIS — Z510 Encounter for antineoplastic radiation therapy: Secondary | ICD-10-CM | POA: Diagnosis not present

## 2016-01-16 NOTE — Patient Instructions (Signed)
Contact our office if you have any questions following today's appointment: [phone].  

## 2016-01-16 NOTE — Progress Notes (Addendum)
Weekly rad tyxs left breast, nipple  incsision area drainage slight yeloow colores, "The drainage has slowed down," gave dressings and medipore tape  ,using radiaplex tid, some hyperpigmentation  Slight,  Appetite good, no pai stated 9:03 AM BP 147/52 mmHg  Pulse 59  Temp(Src) 97.8 F (36.6 C) (Oral)  Resp 20  Wt 209 lb 1.6 oz (94.847 kg)  Wt Readings from Last 3 Encounters:  01/16/16 209 lb 1.6 oz (94.847 kg)  01/02/16 209 lb 6.4 oz (94.983 kg)  12/26/15 210 lb (95.255 kg)

## 2016-01-16 NOTE — Progress Notes (Signed)
   Department of Radiation Oncology  Phone:  [phone] Fax:        [phone]  Weekly Treatment Note    Name: Audrey Fischer Date: 01/18/2016 MRN: [id number] DOB: 10-20-1956   Diagnosis:     ICD-9-CM ICD-10-CM   1. Cancer of central portion of left breast (HCC) 174.1 C50.112      Current dose: 34.2/60.4 Gy  Current fraction: 19/33   MEDICATIONS: Current Outpatient Prescriptions  Medication Sig Dispense Refill  . aspirin 81 MG tablet Take 81 mg by mouth daily.    Marland Kitchen atenolol (TENORMIN) 50 MG tablet Take 50 mg by mouth every evening.     . Cholecalciferol (VITAMIN D3) 2000 UNITS TABS Take 1 tablet by mouth daily.    . hyaluronate sodium (RADIAPLEXRX) GEL Apply 1 application topically 2 (two) times daily.    . nitroGLYCERIN (NITROSTAT) 0.4 MG SL tablet Place 0.4 mg under the tongue. Reported on Q000111Q    . non-metallic deodorant Jethro Poling) MISC Apply 1 application topically daily as needed.    . rosuvastatin (CRESTOR) 10 MG tablet Take 10 mg by mouth every evening.      No current facility-administered medications for this encounter.     ALLERGIES: Capsule   LABORATORY DATA:  Lab Results  Component Value Date   WBC 6.6 08/15/2015   HGB 14.3 08/15/2015   HCT 42.7 08/15/2015   MCV 83.4 08/15/2015   PLT 183 08/15/2015   Lab Results  Component Value Date   NA 142 07/30/2015   K 4.0 07/30/2015   CO2 23 07/30/2015   Lab Results  Component Value Date   ALT 25 07/30/2015   AST 16 07/30/2015   ALKPHOS 71 07/30/2015   BILITOT 0.49 07/30/2015     NARRATIVE: Audrey Fischer was seen today for weekly treatment management. The chart was checked and the patient's films were reviewed.  On review of systems, the patient reports she is doing pretty well. She continues to have some serous drainage from her incision line. She denies any itching or burning. She is not expressing any heat at her skin. She denies any fevers or chills. She denies any chest pain or shortness of  breath. No other complaints or verbalized.  PHYSICAL EXAMINATION: weight is 209 lb 1.6 oz (94.847 kg). Her oral temperature is 97.8 F (36.6 C). Her blood pressure is 147/52 and her pulse is 59. Her respiration is 20.      Pain scale 0/10 In general this is a very pleasant African-American female in no acute distress. She is alert and oriented 4 and appropriate throughout the examination. The pulmonary exam reveals no acute distress and reveals normal effort. The left breast is assessed, and reveals mild serous drainage from a pinpoint opening along the middle of her incision line. No purulent matter is expressed. No induration or erythema is otherwise present. No desquamation is noted.  ASSESSMENT: 60 year old female undergoing external radiation to the left breast   PLAN: We will continue with the patient's radiation treatment as planned. She is tolerating her treatment well. The finding on exam correlates with postoperative healing rather than radiation-induced skin breakdown. We will continue to follow this closely.       Jodelle Gross, MD, PhD

## 2016-01-17 ENCOUNTER — Ambulatory Visit: Payer: BC Managed Care – PPO

## 2016-01-19 ENCOUNTER — Ambulatory Visit
Admission: RE | Admit: 2016-01-19 | Discharge: 2016-01-19 | Disposition: A | Discharge status: Home / Self Care | Payer: BC Managed Care – PPO | Source: Ambulatory Visit | Attending: Radiation Oncology | Admitting: Radiation Oncology

## 2016-01-19 DIAGNOSIS — Z510 Encounter for antineoplastic radiation therapy: Secondary | ICD-10-CM | POA: Diagnosis not present

## 2016-01-20 ENCOUNTER — Ambulatory Visit
Admission: RE | Admit: 2016-01-20 | Discharge: 2016-01-20 | Disposition: A | Discharge status: Home / Self Care | Payer: BC Managed Care – PPO | Source: Ambulatory Visit | Attending: Radiation Oncology | Admitting: Radiation Oncology

## 2016-01-20 DIAGNOSIS — Z510 Encounter for antineoplastic radiation therapy: Secondary | ICD-10-CM | POA: Diagnosis not present

## 2016-01-21 ENCOUNTER — Encounter: Payer: Self-pay | Admitting: Radiation Oncology

## 2016-01-21 ENCOUNTER — Ambulatory Visit
Admission: RE | Admit: 2016-01-21 | Discharge: 2016-01-21 | Disposition: A | Discharge status: Home / Self Care | Payer: BC Managed Care – PPO | Source: Ambulatory Visit | Attending: Radiation Oncology | Admitting: Radiation Oncology

## 2016-01-21 DIAGNOSIS — Z510 Encounter for antineoplastic radiation therapy: Secondary | ICD-10-CM | POA: Diagnosis not present

## 2016-01-22 ENCOUNTER — Ambulatory Visit
Admission: RE | Admit: 2016-01-22 | Discharge: 2016-01-22 | Disposition: A | Discharge status: Home / Self Care | Payer: BC Managed Care – PPO | Source: Ambulatory Visit | Attending: Radiation Oncology | Admitting: Radiation Oncology

## 2016-01-22 DIAGNOSIS — Z510 Encounter for antineoplastic radiation therapy: Secondary | ICD-10-CM | POA: Diagnosis not present

## 2016-01-23 ENCOUNTER — Ambulatory Visit
Admission: RE | Admit: 2016-01-23 | Discharge: 2016-01-23 | Disposition: A | Discharge status: Home / Self Care | Payer: BC Managed Care – PPO | Source: Ambulatory Visit | Attending: Radiation Oncology | Admitting: Radiation Oncology

## 2016-01-23 DIAGNOSIS — Z510 Encounter for antineoplastic radiation therapy: Secondary | ICD-10-CM | POA: Diagnosis not present

## 2016-01-23 DIAGNOSIS — C50112 Malignant neoplasm of central portion of left female breast: Secondary | ICD-10-CM

## 2016-01-23 NOTE — Progress Notes (Signed)
  Radiation Oncology         [phone]) [phone] ________________________________  Name: Audrey Fischer MRN: [id number]  Date: 01/21/2016  DOB: Nov 24, 1956  Complex simulation note  The patient has undergone complex simulation for her upcoming boost treatment for her diagnosis of breast cancer. The patient has initially been planned to receive 50.4 Gy. The patient will now receive a 10 Gy boost to the seroma cavity which has been contoured. This will be accomplished using an en face electron field. Based on the depth of the target area, 12 and 15 MeV electrons will be used. The patient's final total dose therefore will be 60.4 Gy. A complex isodose plan from the electron [facility] Carlo calculation is requested for the boost treatment.   _______________________________  Jodelle Gross, MD, PhD

## 2016-01-23 NOTE — Addendum Note (Signed)
Encounter addended by: Doreen Beam, RN on: 01/23/2016 10:46 AM<BR>     Documentation filed: Notes Section

## 2016-01-23 NOTE — Progress Notes (Signed)
   Department of Radiation Oncology  Phone:  [phone] Fax:        [phone]  Weekly Treatment Note    Name: Audrey Fischer Date: 01/23/2016 MRN: [id number] DOB: [birth year]   Diagnosis:     ICD-9-CM ICD-10-CM   1. Cancer of central portion of left breast (HCC) 174.1 C50.112      Current dose: 43.2 Gy  Current fraction: 24   MEDICATIONS: Current Outpatient Prescriptions  Medication Sig Dispense Refill  . aspirin 81 MG tablet Take 81 mg by mouth daily.    Marland Kitchen atenolol (TENORMIN) 50 MG tablet Take 50 mg by mouth every evening.     . Cholecalciferol (VITAMIN D3) 2000 UNITS TABS Take 1 tablet by mouth daily.    . hyaluronate sodium (RADIAPLEXRX) GEL Apply 1 application topically 2 (two) times daily.    . nitroGLYCERIN (NITROSTAT) 0.4 MG SL tablet Place 0.4 mg under the tongue. Reported on Q000111Q    . non-metallic deodorant Jethro Poling) MISC Apply 1 application topically daily as needed.    . rosuvastatin (CRESTOR) 10 MG tablet Take 10 mg by mouth every evening.      No current facility-administered medications for this encounter.     ALLERGIES: Capsule   LABORATORY DATA:  Lab Results  Component Value Date   WBC 6.6 08/15/2015   HGB 14.3 08/15/2015   HCT 42.7 08/15/2015   MCV 83.4 08/15/2015   PLT 183 08/15/2015   Lab Results  Component Value Date   NA 142 07/30/2015   K 4.0 07/30/2015   CO2 23 07/30/2015   Lab Results  Component Value Date   ALT 25 07/30/2015   AST 16 07/30/2015   ALKPHOS 71 07/30/2015   BILITOT 0.49 07/30/2015     NARRATIVE: Audrey Fischer was seen today for weekly treatment management. The chart was checked and the patient's films were reviewed.  The patient is doing very well she states this week. No complaints of difficulty. No severe skin irritation. The patient's electron boost treatment is being set up today and was reviewed  PHYSICAL EXAMINATION: vitals were not taken for this visit.     The patient's skin looks quite good.  Hyperpigmentation without significant desquamation.  ASSESSMENT: The patient is doing satisfactorily with treatment.  PLAN: We will continue with the patient's radiation treatment as planned.

## 2016-01-23 NOTE — Progress Notes (Signed)
MD saw patient on Lianc table, not sent to nursing for assessment, patient going to a funeral and had to leave once treatment was done 10:46 AM

## 2016-01-24 ENCOUNTER — Ambulatory Visit: Payer: BC Managed Care – PPO

## 2016-01-26 ENCOUNTER — Ambulatory Visit
Admission: RE | Admit: 2016-01-26 | Discharge: 2016-01-26 | Disposition: A | Discharge status: Home / Self Care | Payer: BC Managed Care – PPO | Source: Ambulatory Visit | Attending: Radiation Oncology | Admitting: Radiation Oncology

## 2016-01-26 DIAGNOSIS — Z510 Encounter for antineoplastic radiation therapy: Secondary | ICD-10-CM | POA: Diagnosis not present

## 2016-01-27 ENCOUNTER — Ambulatory Visit
Admission: RE | Admit: 2016-01-27 | Discharge: 2016-01-27 | Disposition: A | Discharge status: Home / Self Care | Payer: BC Managed Care – PPO | Source: Ambulatory Visit | Attending: Radiation Oncology | Admitting: Radiation Oncology

## 2016-01-27 DIAGNOSIS — Z510 Encounter for antineoplastic radiation therapy: Secondary | ICD-10-CM | POA: Diagnosis not present

## 2016-01-28 ENCOUNTER — Ambulatory Visit
Admission: RE | Admit: 2016-01-28 | Discharge: 2016-01-28 | Disposition: A | Discharge status: Home / Self Care | Payer: BC Managed Care – PPO | Source: Ambulatory Visit | Attending: Radiation Oncology | Admitting: Radiation Oncology

## 2016-01-28 DIAGNOSIS — Z510 Encounter for antineoplastic radiation therapy: Secondary | ICD-10-CM | POA: Diagnosis not present

## 2016-01-28 DIAGNOSIS — C50112 Malignant neoplasm of central portion of left female breast: Secondary | ICD-10-CM | POA: Insufficient documentation

## 2016-01-29 ENCOUNTER — Ambulatory Visit
Admission: RE | Admit: 2016-01-29 | Discharge: 2016-01-29 | Disposition: A | Discharge status: Home / Self Care | Payer: BC Managed Care – PPO | Source: Ambulatory Visit | Attending: Radiation Oncology | Admitting: Radiation Oncology

## 2016-01-29 DIAGNOSIS — Z510 Encounter for antineoplastic radiation therapy: Secondary | ICD-10-CM | POA: Diagnosis not present

## 2016-01-30 ENCOUNTER — Ambulatory Visit
Admission: RE | Admit: 2016-01-30 | Discharge: 2016-01-30 | Disposition: A | Discharge status: Home / Self Care | Payer: BC Managed Care – PPO | Source: Ambulatory Visit | Attending: Radiation Oncology | Admitting: Radiation Oncology

## 2016-01-30 ENCOUNTER — Encounter: Payer: Self-pay | Admitting: Radiation Oncology

## 2016-01-30 VITALS — BP 154/96 | HR 63 | Temp 97.7°F | Resp 20 | Wt 206.6 lb

## 2016-01-30 DIAGNOSIS — C50112 Malignant neoplasm of central portion of left female breast: Secondary | ICD-10-CM

## 2016-01-30 DIAGNOSIS — Z510 Encounter for antineoplastic radiation therapy: Secondary | ICD-10-CM | POA: Diagnosis not present

## 2016-01-30 NOTE — Progress Notes (Signed)
   Department of Radiation Oncology  Phone:  [phone] Fax:        [phone]  Weekly Treatment Note    Name: Audrey Fischer Date: 01/30/2016 MRN: [id number] DOB: 1956-08-25   Diagnosis:     ICD-9-CM ICD-10-CM   1. Cancer of central portion of left breast (HCC) 174.1 C50.112      Current dose: 52.4 Gy  Current fraction: 29   MEDICATIONS: Current Outpatient Prescriptions  Medication Sig Dispense Refill  . aspirin 81 MG tablet Take 81 mg by mouth daily.    Marland Kitchen atenolol (TENORMIN) 50 MG tablet Take 50 mg by mouth every evening.     . Cholecalciferol (VITAMIN D3) 2000 UNITS TABS Take 1 tablet by mouth daily.    Marland Kitchen guaiFENesin (MUCINEX) 600 MG 12 hr tablet Take by mouth 2 (two) times daily as needed.    . hyaluronate sodium (RADIAPLEXRX) GEL Apply 1 application topically 2 (two) times daily.    . non-metallic deodorant Jethro Poling) MISC Apply 1 application topically daily as needed.    . rosuvastatin (CRESTOR) 10 MG tablet Take 10 mg by mouth every evening.     . nitroGLYCERIN (NITROSTAT) 0.4 MG SL tablet Place 0.4 mg under the tongue. Reported on 01/30/2016     No current facility-administered medications for this encounter.     ALLERGIES: Capsule   LABORATORY DATA:  Lab Results  Component Value Date   WBC 6.6 08/15/2015   HGB 14.3 08/15/2015   HCT 42.7 08/15/2015   MCV 83.4 08/15/2015   PLT 183 08/15/2015   Lab Results  Component Value Date   NA 142 07/30/2015   K 4.0 07/30/2015   CO2 23 07/30/2015   Lab Results  Component Value Date   ALT 25 07/30/2015   AST 16 07/30/2015   ALKPHOS 71 07/30/2015   BILITOT 0.49 07/30/2015     NARRATIVE: Audrey Fischer was seen today for weekly treatment management. The chart was checked and the patient's films were reviewed.  Weekly rad txs  29/33, started boost  Today left breast, hyperpigmentation,skin intact except for under unframmary fold of breast, moist desquamation there, using neosporin there, and radaioplex bid  elswwhere on the breast, appetite good, energy level pain, no pain 6:02 PM BP 154/96 mmHg  Pulse 63  Temp(Src) 97.7 F (36.5 C) (Oral)  Resp 20  Wt 206 lb 9.6 oz (93.713 kg)  Wt Readings from Last 3 Encounters:  01/30/16 206 lb 9.6 oz (93.713 kg)  01/16/16 209 lb 1.6 oz (94.847 kg)  01/02/16 209 lb 6.4 oz (94.983 kg)    PHYSICAL EXAMINATION: weight is 206 lb 9.6 oz (93.713 kg). Her oral temperature is 97.7 F (36.5 C). Her blood pressure is 154/96 and her pulse is 63. Her respiration is 20.      Small area of moist desquamation in the inframammary region. Diffuse radiation change elsewhere.  ASSESSMENT: The patient is doing satisfactorily with treatment.  PLAN: We will continue with the patient's radiation treatment as planned. The patient will continue to use Neosporin with pain relief underneath the breast. We will continue to follow this next week.

## 2016-01-30 NOTE — Progress Notes (Addendum)
Weekly rad txs  29/33, started boost  Today left breast, hyperpigmentation,skin intact except for under unframmary fold of breast, moist desquamation there, using neosporin there, and radaioplex bid elswwhere on the breast, appetite good, energy level pain, no pain 8:53 AM BP 154/96 mmHg  Pulse 63  Temp(Src) 97.7 F (36.5 C) (Oral)  Resp 20  Wt 206 lb 9.6 oz (93.713 kg)  Wt Readings from Last 3 Encounters:  01/30/16 206 lb 9.6 oz (93.713 kg)  01/16/16 209 lb 1.6 oz (94.847 kg)  01/02/16 209 lb 6.4 oz (94.983 kg)

## 2016-01-31 ENCOUNTER — Ambulatory Visit: Payer: BC Managed Care – PPO

## 2016-02-02 ENCOUNTER — Ambulatory Visit: Payer: BC Managed Care – PPO

## 2016-02-02 ENCOUNTER — Ambulatory Visit
Admission: RE | Admit: 2016-02-02 | Discharge: 2016-02-02 | Disposition: A | Discharge status: Home / Self Care | Payer: BC Managed Care – PPO | Source: Ambulatory Visit | Attending: Radiation Oncology | Admitting: Radiation Oncology

## 2016-02-02 DIAGNOSIS — Z510 Encounter for antineoplastic radiation therapy: Secondary | ICD-10-CM | POA: Diagnosis not present

## 2016-02-03 ENCOUNTER — Ambulatory Visit
Admission: RE | Admit: 2016-02-03 | Discharge: 2016-02-03 | Disposition: A | Discharge status: Home / Self Care | Payer: BC Managed Care – PPO | Source: Ambulatory Visit | Attending: Radiation Oncology | Admitting: Radiation Oncology

## 2016-02-03 DIAGNOSIS — Z510 Encounter for antineoplastic radiation therapy: Secondary | ICD-10-CM | POA: Diagnosis not present

## 2016-02-04 ENCOUNTER — Ambulatory Visit
Admission: RE | Admit: 2016-02-04 | Discharge: 2016-02-04 | Disposition: A | Discharge status: Home / Self Care | Payer: BC Managed Care – PPO | Source: Ambulatory Visit | Attending: Radiation Oncology | Admitting: Radiation Oncology

## 2016-02-04 ENCOUNTER — Encounter: Payer: Self-pay | Admitting: Radiation Oncology

## 2016-02-04 VITALS — BP 145/91 | HR 62 | Temp 98.0°F | Resp 16 | Wt 206.1 lb

## 2016-02-04 DIAGNOSIS — C50112 Malignant neoplasm of central portion of left female breast: Secondary | ICD-10-CM

## 2016-02-04 DIAGNOSIS — Z510 Encounter for antineoplastic radiation therapy: Secondary | ICD-10-CM | POA: Diagnosis not present

## 2016-02-04 NOTE — Progress Notes (Addendum)
Weekly rad ts 32/33 left breast, hyperpigmentation, under axilla drynes and has peeled, under inframmary fold, healed, using radiaplex and neosporin,  Appetite good,, mild fatigue  gave 1 month f/u appt 03/09/16 with Shona Simpson, PA, will email  B. Epperson for [facility] schedule  Rad/Onc .There were no vitals taken for this visit.  Wt Readings from Last 3 Encounters:  01/30/16 206 lb 9.6 oz (93.713 kg)  01/16/16 209 lb 1.6 oz (94.847 kg)  01/02/16 209 lb 6.4 oz (94.983 kg)  BP 145/91 mmHg  Pulse 62  Temp(Src) 98 F (36.7 C) (Oral)  Resp 16  Wt 206 lb 1.6 oz (93.486 kg)

## 2016-02-04 NOTE — Progress Notes (Signed)
Department of Radiation Oncology  Phone:  [phone] Fax:        [phone]  Weekly Treatment Note    Name: Audrey Fischer Date: 02/04/2016 MRN: [id number] DOB: Jun 10, 1956   Diagnosis:     ICD-9-CM ICD-10-CM   1. Cancer of central portion of left breast (HCC) 174.1 C50.112      Current dose: 58.4 Gy  Current fraction: 32   MEDICATIONS: Current Outpatient Prescriptions  Medication Sig Dispense Refill  . aspirin 81 MG tablet Take 81 mg by mouth daily.    Marland Kitchen atenolol (TENORMIN) 50 MG tablet Take 50 mg by mouth every evening.     . Cholecalciferol (VITAMIN D3) 2000 UNITS TABS Take 1 tablet by mouth daily.    Marland Kitchen guaiFENesin (MUCINEX) 600 MG 12 hr tablet Take by mouth 2 (two) times daily as needed.    . hyaluronate sodium (RADIAPLEXRX) GEL Apply 1 application topically 2 (two) times daily.    . nitroGLYCERIN (NITROSTAT) 0.4 MG SL tablet Place 0.4 mg under the tongue. Reported on 123XX123    . non-metallic deodorant Jethro Poling) MISC Apply 1 application topically daily as needed.    . rosuvastatin (CRESTOR) 10 MG tablet Take 10 mg by mouth every evening.      No current facility-administered medications for this encounter.     ALLERGIES: Capsule   LABORATORY DATA:  Lab Results  Component Value Date   WBC 6.6 08/15/2015   HGB 14.3 08/15/2015   HCT 42.7 08/15/2015   MCV 83.4 08/15/2015   PLT 183 08/15/2015   Lab Results  Component Value Date   NA 142 07/30/2015   K 4.0 07/30/2015   CO2 23 07/30/2015   Lab Results  Component Value Date   ALT 25 07/30/2015   AST 16 07/30/2015   ALKPHOS 71 07/30/2015   BILITOT 0.49 07/30/2015     NARRATIVE: Audrey Fischer was seen today for weekly treatment management. The chart was checked and the patient's films were reviewed.  Weekly rad ts 32/33 left breast, hyperpigmentation, under axilla drynes and has peeled, under inframmary fold, healed, using radiaplex and neosporin, Appetite good,, mild fatigue gave 1 month f/u appt  03/09/16 with Shona Simpson, PA, will email B. Epperson for [facility] schedule  10:23 AM BP 145/91 mmHg  Pulse 62  Temp(Src) 98 F (36.7 C) (Oral)  Resp 16  Wt 206 lb 1.6 oz (93.486 kg)  Wt Readings from Last 3 Encounters:  02/04/16 206 lb 1.6 oz (93.486 kg)  01/30/16 206 lb 9.6 oz (93.713 kg)  01/16/16 209 lb 1.6 oz (94.847 kg)    PHYSICAL EXAMINATION: weight is 206 lb 1.6 oz (93.486 kg). Her oral temperature is 98 F (36.7 C). Her blood pressure is 145/91 and her pulse is 62. Her respiration is 16.      Small area of moist desquamation in the inframammary region. Diffuse radiation change elsewhere.  ASSESSMENT: The patient is doing satisfactorily with treatment.  PLAN: We will continue with the patient's radiation treatment as planned. The patient will continue to use Neosporin with pain relief underneath the breast. We will continue to follow this next week.    ------------------------------------------------  Jodelle Gross, MD, PhD    This document serves as a record of services personally performed by Kyung Rudd, MD. It was created on his behalf by  Lendon Collar, a trained medical scribe. The creation of this record is based on the scribe's personal observations and the provider's statements to them. This document  has been checked and approved by the attending provider.

## 2016-02-05 ENCOUNTER — Encounter: Payer: Self-pay | Admitting: Radiation Oncology

## 2016-02-05 ENCOUNTER — Ambulatory Visit
Admission: RE | Admit: 2016-02-05 | Discharge: 2016-02-05 | Disposition: A | Discharge status: Home / Self Care | Payer: BC Managed Care – PPO | Source: Ambulatory Visit | Attending: Radiation Oncology | Admitting: Radiation Oncology

## 2016-02-05 DIAGNOSIS — Z510 Encounter for antineoplastic radiation therapy: Secondary | ICD-10-CM | POA: Diagnosis not present

## 2016-02-05 DIAGNOSIS — C50112 Malignant neoplasm of central portion of left female breast: Secondary | ICD-10-CM

## 2016-02-05 MED ORDER — RADIAPLEXRX EX GEL
Freq: Once | CUTANEOUS | Status: AC
  Administered 2016-02-05: 16:00:00 via TOPICAL

## 2016-02-09 ENCOUNTER — Telehealth: Payer: Self-pay | Admitting: *Deleted

## 2016-02-09 ENCOUNTER — Other Ambulatory Visit: Payer: Self-pay | Admitting: *Deleted

## 2016-02-09 NOTE — Telephone Encounter (Signed)
Spoke with patient to follow up after XRT completion.  She is doing well.  She is interested in [name] program and gave her the phone number to found out when the next one is.  Encouraged her to call with any needs or concerns.

## 2016-02-10 ENCOUNTER — Telehealth: Payer: Self-pay | Admitting: Oncology

## 2016-02-10 ENCOUNTER — Telehealth: Payer: Self-pay

## 2016-02-10 NOTE — Telephone Encounter (Signed)
APPT SET

## 2016-02-10 NOTE — Telephone Encounter (Signed)
lvm for pt regarding to March appt.... °

## 2016-02-10 NOTE — Telephone Encounter (Signed)
LVM for pt to call back as soon as possible.   Re: Flu vaccine 2016-2017

## 2016-02-11 ENCOUNTER — Other Ambulatory Visit: Payer: Self-pay | Admitting: Adult Health

## 2016-02-11 DIAGNOSIS — C50112 Malignant neoplasm of central portion of left female breast: Secondary | ICD-10-CM

## 2016-02-13 ENCOUNTER — Ambulatory Visit (INDEPENDENT_AMBULATORY_CARE_PROVIDER_SITE_OTHER): Payer: BC Managed Care – PPO

## 2016-02-13 DIAGNOSIS — Z23 Encounter for immunization: Secondary | ICD-10-CM

## 2016-02-18 ENCOUNTER — Ambulatory Visit: Payer: BC Managed Care – PPO | Admitting: Oncology

## 2016-02-20 NOTE — Telephone Encounter (Signed)
Pt received flu vac - closing note.  

## 2016-03-08 NOTE — Progress Notes (Signed)
°  Radiation Oncology         [phone]) [phone] ________________________________  Name: Audrey Fischer MRN: [id number]  Date: 02/05/2016  DOB: 1956/03/02  End of Treatment Note  Diagnosis:   Invasive Ductal Carcinoma of the Left Breast, Grade II, Stage [id number]     Indication for treatment:  Curative       Radiation treatment dates:   12/22/15 - 02/05/16  Site/dose:   Left breast treated to 50.4 Gy in 28 fractions, Left breast boost treated to 10 Gy in 5 fractions  Beams/energy:   Left breast: 3D-Conformal / 10X , Left breast boost: Electron Monte Carlo/ 12MeV, 15 MeV   Narrative: The patient tolerated radiation treatment relatively well.     Plan: The patient has completed radiation treatment. The patient will return to radiation oncology clinic for routine followup in one month. I advised them to call or return sooner if they have any questions or concerns related to their recovery or treatment.  ------------------------------------------------  Jodelle Gross, MD, PhD   This document serves as a record of services personally performed by Kyung Rudd, MD. It was created on his behalf by Derek Mound, a trained medical scribe. The creation of this record is based on the scribe's personal observations and the provider's statements to them. This document has been checked and approved by the attending provider.

## 2016-03-09 ENCOUNTER — Ambulatory Visit
Admission: RE | Admit: 2016-03-09 | Discharge: 2016-03-09 | Disposition: A | Discharge status: Home / Self Care | Payer: BC Managed Care – PPO | Source: Ambulatory Visit | Attending: Radiation Oncology | Admitting: Radiation Oncology

## 2016-03-09 ENCOUNTER — Encounter: Payer: Self-pay | Admitting: Radiation Oncology

## 2016-03-09 VITALS — BP 157/86 | HR 60 | Temp 97.6°F | Ht 68.5 in | Wt 201.5 lb

## 2016-03-09 DIAGNOSIS — C50112 Malignant neoplasm of central portion of left female breast: Secondary | ICD-10-CM

## 2016-03-09 NOTE — Progress Notes (Signed)
Audrey Fischer returns for FU status post XRT to her left breast.  Hyperpigmentation remains with intact skin.  She denies pain, but reports fatigue with afternoon naps.

## 2016-03-09 NOTE — Progress Notes (Addendum)
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 During the course of her treatment, she did have mild changes consistent with dry desquamation. She continued to use radio flexed during this time.  On review of systems, the patient reports that overall she is doing very well. She states that she has been using vitamin E to her skin, she states that her skin has been somewhat itchy during the course of the last couple of weeks. She denies any pain or fever. She is not experiencing any chest pain or shortness of breath. No other complaints or verbalized.                        ALLERGIES:  is allergic to capsule.  Meds: Current Outpatient Prescriptions  Medication Sig Dispense Refill  . aspirin 81 MG tablet Take 81 mg by mouth daily.    Marland Kitchen atenolol (TENORMIN) 50 MG tablet Take 50 mg by mouth every evening.     . Cholecalciferol (VITAMIN D3) 2000 UNITS TABS Take 1 tablet by mouth daily.    . nitroGLYCERIN (NITROSTAT) 0.4 MG SL tablet Place 0.4 mg under the tongue. Reported on 08/05/262    . non-metallic deodorant Jethro Poling) MISC Apply 1 application topically daily as needed.    . rosuvastatin (CRESTOR) 10 MG tablet Take 10 mg by mouth every evening.      No current facility-administered medications for this encounter.    Physical Findings:  height is 5' 8.5" (1.74 m) and weight is 201 lb 8 oz (91.4 kg). Her temperature is 97.6 F (36.4 C). Her blood pressure is 157/86 and her pulse is 60.  .   In general this is a well appearing African-American in no acute distress. She's alert and oriented x4 and appropriate throughout the examination. Cardiopulmonary assessment is negative for acute distress and she exhibits normal effort. The left chest wall is inspected, there is mild hyperpigmentation of the chest wall itself, and extending down towards the nipple there is hyperpigmentation with mild changes consistent with dry desquamation. No evidence of any changes beneath the breast along the inframammary fold are noted.  Lab Findings: Lab Results  Component Value Date   WBC 6.6 08/15/2015   HGB 14.3 08/15/2015   HCT 42.7 08/15/2015   MCV 83.4 08/15/2015   PLT 183 08/15/2015     Radiographic Findings: No results found.  Impression/Plan:  1. 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 The patient has done very well following her radiotherapy to the left breast. Her skin is improving, there is no evidence of breakdown, and minimal dry desquamation is present. She is counseled now on the use of vitamin E, and she will apply this 3 times a day when necessary. She's encouraged to continue to follow up with Dr. Jana Hakim, and has an appointment on Thursday of this week to discuss antiestrogen therapy. We will  follow up on an as-needed basis with the patient and she will be under his close follow-up surveillance care, as well as meeting with survivorship clinic. 2. Survivorship. The patient already has plans to follow up with Chestine Spore, NP in May. She will continue to be resource for the patient and she was an survivorship. We appreciate Heather's involvement in her care.    Carola Rhine, PAC

## 2016-03-11 ENCOUNTER — Ambulatory Visit (HOSPITAL_BASED_OUTPATIENT_CLINIC_OR_DEPARTMENT_OTHER): Payer: BC Managed Care – PPO | Admitting: Oncology

## 2016-03-11 ENCOUNTER — Telehealth: Payer: Self-pay | Admitting: Oncology

## 2016-03-11 VITALS — BP 149/86 | HR 85 | Temp 97.5°F | Resp 18 | Ht 68.5 in | Wt 201.3 lb

## 2016-03-11 DIAGNOSIS — C50512 Malignant neoplasm of lower-outer quadrant of left female breast: Secondary | ICD-10-CM

## 2016-03-11 DIAGNOSIS — Z79810 Long term (current) use of selective estrogen receptor modulators (SERMs): Secondary | ICD-10-CM | POA: Diagnosis not present

## 2016-03-11 DIAGNOSIS — C50112 Malignant neoplasm of central portion of left female breast: Secondary | ICD-10-CM

## 2016-03-11 DIAGNOSIS — Z170 Estrogen receptor positive status [ER+]: Secondary | ICD-10-CM

## 2016-03-11 MED ORDER — TAMOXIFEN CITRATE 20 MG PO TABS: 20.0000 mg | ORAL_TABLET | Freq: Every day | ORAL | Status: AC

## 2016-03-11 NOTE — Telephone Encounter (Signed)
left msg for july appt

## 2016-03-11 NOTE — Progress Notes (Signed)
[facility]  Telephone:([phone]) [phone] Fax:(336) [phone]     ID: Audrey Fischer DOB: Oct 11, 1956  MR#: [id number]  WPY#:[id number]  Patient Care Team: Audrey Koch, MD as PCP - General (Internal Medicine) Audrey Cruel, MD as Consulting Physician (Oncology) Audrey Messing III, MD as Consulting Physician (General Surgery) Audrey Rudd, MD as Consulting Physician (Radiation Oncology) Audrey Manges, MD as Consulting Physician (Obstetrics and Gynecology) Audrey Bears, MD as Consulting Physician (Gastroenterology) OTHER MD: Audrey Hila MD  CHIEF COMPLAINT: estrogen receptor positive breast cancer  CURRENT TREATMENT:   BREAST CANCER HISTORY: From the original intake note:  Audrey Fischer tells me after her husband's death in 02/05/06 she neglected her own health and did not see a doctor for several years. More recently she had a menstrual period after thinking she was in menopause. This took her to [facility]. [facility] who is in the process of investigating the bleeding problems. She also updated Audrey Fischer's health maintenance with mammography at [facility] obtained 07/02/2015. This showed a suspicious finding in the right breast and Audrey Fischer was referred to the breast Center where on 07/07/2015 she had bilateral diagnostic mammography with tomosynthesis and right breast ultrasonography. The breast density was category D.   In the left breast there was a small mass in the anterior aspect of the lower outer quadrant. By tomography this was irregular and spiculated. On physical exam it measured approximately 1.2 cm in the 7:00 position immediately adjacent to the nipple. Ultrasound found a 1.3 cm hypoechoic mass in the area in question. There were also 3 small oval hypoechoic masses containing echogenic material, the largest measuring 4 mm, with a thin internal septation. Ultrasound of the left axilla was unremarkable.   biopsy of the left breast mass in question 07/09/2015 showed (SAA  [id number]) and invasive ductal carcinoma, grade 1, estrogen receptor 100% positive, progesterone receptor 100% positive, with an Audrey Fischer of 10%, and no HER-2 amplification, the signals ratio being 1.62 and the number per cell 2.10.  The patient's subsequent history is as detailed below  INTERVAL HISTORY:  Audrey Fischer returns today for follow up of her early stage breast cancer. Recall she had her surgery at [facility] under ad lib. been in October and this showed a 1.6 cm invasive ductal carcinoma, grade 2, with all 3 sentinel lymph nodes clear. Margins were clear as well. She then proceeded to radiation, which was completed 02/04/2016.  REVIEW OF SYSTEMS: She slept for about 4 days after the surgery but finally "woke up" yesterday. She is feeling like she has a little bit more energy now. Otherwise she denies unusual headaches, visual changes, nausea, vomiting, shortness of breath, chest pain or pressure, change in bowel or bladder habits, rash, or other worrisome symptoms. Occasionally she notices mild ankle swelling. A detailed review of systems was otherwise negative.  PAST MEDICAL HISTORY: Past Medical History  Diagnosis Date  . Hypertension   . Hyperlipidemia   . Breast cancer ([place]) 07/09/15    left breast,  lower-inner quadrant   . Coronary artery disease     cardiologist-  [facility], [facility] cardiology in [facility])---  hx PCI w/ stents to OM and LAD 02-08-2008  . S/P coronary artery stent placement   . PMB (postmenopausal bleeding)     PAST SURGICAL HISTORY: Past Surgical History  Procedure Laterality Date  . Cesarean section  10-09-1996  . Coronary angioplasty with stent placement  02-08-2008   Audrey Fischer    PCI  and stent to OM and LAD  . Dilation and curettage of uterus  1994    blighted ovum  . Dilatation & currettage/hysteroscopy with resectocope N/A 08/15/2015    Procedure: DILATATION & CURETTAGE/HYSTEROSCOPY ;  Surgeon: Audrey Manges, MD;  Location:  [facility];  Service: Gynecology;  Laterality: N/A;    FAMILY HISTORY Family History  Problem Relation Age of Onset  . Heart disease Mother   . Stroke Mother   . Colon cancer Mother 38  . Heart disease Father   The patient's father died at the age of 44 from a myocardial infarction. The patient's mother died at the age of 70, with a stroke. Both have significant diabetes problems. The patient has one brother, no sisters. On the mother's side 1 aunt had stomach cancer one cousin prostate cancer. The patient's own mother was diagnosed with colon cancer at age 47. There is no history of breast, ovarian, or uterine cancer in the family to the patient's knowledge.   GYNECOLOGIC HISTORY:  No LMP recorded. Patient is postmenopausal.  menarche age 30. First live birth age 24. The patient is GX P2. She never took hormone replacement. She did use oral contraceptives remotely, without complication  SOCIAL HISTORY:  Audrey Fischer teaches middle school math. Her husband died from infectious complications following back surgery in 2007. Her children are Audrey Fischer who is a Equities trader at [employer] in [place] [employer], and Audrey Fischer, and freshman at [employer] with an eye to studying physical therapy the patient attends [place]: Not in place. At the initial clinic visit 03/11/2016 the patient was given the appropriate forms to complete and notarize at her discretion.   HEALTH MAINTENANCE: Social History  Substance Use Topics  . Smoking status: Never Smoker   . Smokeless tobacco: Never Used  . Alcohol Use: 0.0 oz/week    0 Standard drinks or equivalent per week     Comment: occasionally     Colonoscopy:07/21/2015 / [facility]  PAP: UTD/ [facility]  Bone density:  Lipid panel:  Allergies  Allergen Reactions  . Capsule [Gelatin] Hives    Any medication that is in gel capsule form    Current Outpatient  Prescriptions  Medication Sig Dispense Refill  . aspirin 81 MG tablet Take 81 mg by mouth daily.    Marland Kitchen atenolol (TENORMIN) 50 MG tablet Take 50 mg by mouth every evening.     . Cholecalciferol (VITAMIN D3) 2000 UNITS TABS Take 1 tablet by mouth daily.    . nitroGLYCERIN (NITROSTAT) 0.4 MG SL tablet Place 0.4 mg under the tongue. Reported on 07/05/7543    . non-metallic deodorant Jethro Poling) MISC Apply 1 application topically daily as needed.    . rosuvastatin (CRESTOR) 10 MG tablet Take 10 mg by mouth every evening.      No current facility-administered medications for this visit.    OBJECTIVE: middle-aged African-American womanWho appears stated age 15 Vitals:   03/11/16 0928  BP: 149/86  Pulse: 85  Temp: 97.5 F (36.4 C)  Resp: 18     Body mass index is 30.16 kg/(m^2).    ECOG FS:1 - Symptomatic but completely ambulatory  Sclerae unicteric, EOMs intact Oropharynx clear, dentition in good repair No cervical or supraclavicular adenopathy Lungs no rales or rhonchi Heart regular rate and rhythm Abd soft, nontender, positive bowel sounds MSK no focal spinal tenderness, no upper extremity lymphedema Neuro: nonfocal, well oriented, appropriate affect  Breasts: The right breast is unremarkable. The left breast is status post lumpectomy and radiation. The nipple areolar complex is absent. There is significant hyperpigmentation remaining over the port area. There is no evidence of residual disease. The left axilla is benign  LAB RESULTS:  CMP     Component Value Date/Time   NA 142 07/30/2015 1620   K 4.0 07/30/2015 1620   CO2 23 07/30/2015 1620   GLUCOSE 125 07/30/2015 1620   BUN 11.2 07/30/2015 1620   CREATININE 1.4* 07/30/2015 1620   CALCIUM 9.7 07/30/2015 1620   PROT 7.3 07/30/2015 1620   ALBUMIN 4.0 07/30/2015 1620   AST 16 07/30/2015 1620   ALT 25 07/30/2015 1620   ALKPHOS 71 07/30/2015 1620   BILITOT 0.49 07/30/2015 1620    INo results found for: SPEP, UPEP  Lab Results    Component Value Date   WBC 6.6 08/15/2015   NEUTROABS 3.5 07/30/2015   HGB 14.3 08/15/2015   HCT 42.7 08/15/2015   MCV 83.4 08/15/2015   PLT 183 08/15/2015      Chemistry      Component Value Date/Time   NA 142 07/30/2015 1620   K 4.0 07/30/2015 1620   CO2 23 07/30/2015 1620   BUN 11.2 07/30/2015 1620   CREATININE 1.4* 07/30/2015 1620      Component Value Date/Time   CALCIUM 9.7 07/30/2015 1620   ALKPHOS 71 07/30/2015 1620   AST 16 07/30/2015 1620   ALT 25 07/30/2015 1620   BILITOT 0.49 07/30/2015 1620       No results found for: LABCA2  No components found for: LABCA125  No results for input(s): INR in the last 168 hours.  Urinalysis No results found for: COLORURINE, APPEARANCEUR, LABSPEC, PHURINE, GLUCOSEU, HGBUR, BILIRUBINUR, KETONESUR, PROTEINUR, UROBILINOGEN, NITRITE, LEUKOCYTESUR  STUDIES: No results found.  ASSESSMENT: 60 y.o. [place], [place] Woman status post left breast biopsy 07/09/2015 for a clinical T1c N0, stage IA invasive ductal carcinoma, grade 1, estrogen and progesterone receptor strongly positive, HER-2 not amplified, with an Audrey Fischer of 10%   (1) status post left central mastectomy with nipple resection and excised sentinel lymph node sampling at [facility] 09/23/2015--for a pT1c pN0, stage IA invasive ductal carcinoma  (2) Oncotype DX Score of 11 predicts a 7% risk of recurrence outside the breast within 10 years if her only systemic therapy is tamoxifen for 5 years. It also predicts no benefit from chemotherapy.  (3) adjuvant radiation 12/22/15 - 02/05/16:  Left breast treated to 50.4 Gy in 28 fractions, Left breast boost treated to 10 Gy in 5 fractions   (4) tamoxifen started April 2017  PLAN: Ivelise has completed the local treatment for her breast cancer. Her risk of local recurrence is likely in the 10% or less range.  However her risk of systemic recurrence is in the 20% range. We know that she would get very little benefit from  chemotherapy, but she will get a significant benefit from anti-estrogens. Her Oncotype tells [place] that if she took tamoxifen for 5 years she would have a 93% chance of not sustaining a systemic recurrence within the next 10 years.  We might be able to do a little bit better than that if she took anastrozole for 5 years, but since she did have a period as recently as 10 months ago I think starting with tamoxifen is a good idea and then reassessing after 2 years would be reasonable. At that time we would obtain a bone density to  help [place] make the decision whether to switch to anastrozole for the last 3 years.  Today we discussed the possible toxicities, side effects and complications of tamoxifen and all information was given her in writing.  I went ahead and started the medication and she will call if she has any significant side effects otherwise she is ready scheduled to see Audrey. Clovis Riley in May. She will return to see me in July. After that assuming she tolerates tamoxifen well we can continue to "tag team her" on an every three-month basis for the next year or so  Terese has gone back to her walking program which is very favorable and I strongly encouraged her to continue it.  She will call with any problems that may develop before her next visit here.  Audrey Cruel, MD   03/11/2016 9:43 AM Medical Oncology and Hematology [facility] [address] [place], [place] [address] Tel. [phone]    Fax. [phone]

## 2016-03-26 ENCOUNTER — Telehealth: Payer: Self-pay | Admitting: Oncology

## 2016-03-26 NOTE — Telephone Encounter (Signed)
PAL - moved 5/4 SCP visit to 5/12. Patient home phone voicemail full. Left message for patient on cell and mailed schedule.

## 2016-04-01 ENCOUNTER — Encounter: Payer: BC Managed Care – PPO | Admitting: Nurse Practitioner

## 2016-04-09 ENCOUNTER — Encounter: Payer: BC Managed Care – PPO | Admitting: Nurse Practitioner

## 2016-04-16 ENCOUNTER — Encounter: Payer: BC Managed Care – PPO | Admitting: Nurse Practitioner

## 2016-04-20 ENCOUNTER — Encounter: Payer: Self-pay | Admitting: Nurse Practitioner

## 2016-04-20 DIAGNOSIS — C50112 Malignant neoplasm of central portion of left female breast: Secondary | ICD-10-CM

## 2016-04-20 NOTE — Progress Notes (Signed)
The Survivorship Care Plan was mailed to Audrey Fischer as she reported not being able to come in to the Survivorship Clinic for an in-person visit at this time. A letter was mailed to her outlining the purpose of the content of the care plan, as well as encouraging her to reach out to me with any questions or concerns.  My business card was included in the correspondence to the patient as well.  A copy of the care plan was also routed/faxed/mailed to Audrey Koch, Audrey Fischer, the patient's PCP.  I will not be placing any follow-up appointments to the Survivorship Clinic for Audrey Fischer, but I am happy to see her at any time in the future for any survivorship concerns that may arise. Thank you for allowing me to participate in her care!  Kenn File, [place] [phone]

## 2016-05-26 ENCOUNTER — Telehealth: Payer: Self-pay | Admitting: *Deleted

## 2016-05-26 NOTE — Telephone Encounter (Signed)
FYI "I have a form I need the Doctor to complete.  Is there a fee?  It is an Psychologist, prison and probation services for [place]."  Advised she drop form off during business hours completing cover sheet for Managed Care.  No fee at this time for forms.  Allow 3 to 5 business days for completion.  No further questions

## 2016-05-31 ENCOUNTER — Encounter: Payer: Self-pay | Admitting: Oncology

## 2016-05-31 NOTE — Progress Notes (Signed)
forms left in my box 05/28/16

## 2016-05-31 NOTE — Progress Notes (Signed)
forms left in my box 05/28/16- left for dr. Jana Hakim to sign

## 2016-06-03 ENCOUNTER — Encounter: Payer: Self-pay | Admitting: Oncology

## 2016-06-03 NOTE — Progress Notes (Signed)
forms left in my box 05/28/16- left for dr. Jana Hakim to sign- left patient know her copy at front with ms wilma-medical recfrds

## 2016-06-03 NOTE — Progress Notes (Signed)
Mailed form copies. I could not leave mess at home or cell#

## 2016-06-14 ENCOUNTER — Encounter: Payer: Self-pay | Admitting: Oncology

## 2016-06-14 NOTE — Progress Notes (Signed)
Let patient know forms were mailed to her address. See prev notes, could not leave her a mess they were ready.

## 2016-06-17 ENCOUNTER — Telehealth: Payer: Self-pay | Admitting: Oncology

## 2016-06-17 ENCOUNTER — Ambulatory Visit (HOSPITAL_BASED_OUTPATIENT_CLINIC_OR_DEPARTMENT_OTHER): Payer: BC Managed Care – PPO | Admitting: Oncology

## 2016-06-17 ENCOUNTER — Other Ambulatory Visit (HOSPITAL_BASED_OUTPATIENT_CLINIC_OR_DEPARTMENT_OTHER): Payer: BC Managed Care – PPO

## 2016-06-17 VITALS — BP 173/78 | HR 73 | Temp 98.0°F | Resp 18 | Ht 68.5 in | Wt 197.9 lb

## 2016-06-17 DIAGNOSIS — C50112 Malignant neoplasm of central portion of left female breast: Secondary | ICD-10-CM

## 2016-06-17 DIAGNOSIS — Z79810 Long term (current) use of selective estrogen receptor modulators (SERMs): Secondary | ICD-10-CM | POA: Diagnosis not present

## 2016-06-17 DIAGNOSIS — Z170 Estrogen receptor positive status [ER+]: Secondary | ICD-10-CM | POA: Diagnosis not present

## 2016-06-17 DIAGNOSIS — R635 Abnormal weight gain: Secondary | ICD-10-CM | POA: Insufficient documentation

## 2016-06-17 LAB — CBC WITH DIFFERENTIAL/PLATELET
BASO%: 0.4 % (ref 0.0–2.0)
BASOS ABS: 0 10*3/uL (ref 0.0–0.1)
EOS ABS: 0.1 10*3/uL (ref 0.0–0.5)
EOS%: 1.1 % (ref 0.0–7.0)
HEMATOCRIT: 41.7 % (ref 34.8–46.6)
HEMOGLOBIN: 14.3 g/dL (ref 11.6–15.9)
LYMPH#: 0.8 10*3/uL — AB (ref 0.9–3.3)
LYMPH%: 17.4 % (ref 14.0–49.7)
MCH: 28 pg (ref 25.1–34.0)
MCHC: 34.3 g/dL (ref 31.5–36.0)
MCV: 81.6 fL (ref 79.5–101.0)
MONO#: 0.2 10*3/uL (ref 0.1–0.9)
MONO%: 3.2 % (ref 0.0–14.0)
NEUT#: 3.7 10*3/uL (ref 1.5–6.5)
NEUT%: 77.9 % — AB (ref 38.4–76.8)
PLATELETS: 170 10*3/uL (ref 145–400)
RBC: 5.11 10*6/uL (ref 3.70–5.45)
RDW: 13.1 % (ref 11.2–14.5)
WBC: 4.7 10*3/uL (ref 3.9–10.3)

## 2016-06-17 LAB — COMPREHENSIVE METABOLIC PANEL
ALBUMIN: 3.8 g/dL (ref 3.5–5.0)
ALK PHOS: 69 U/L (ref 40–150)
ALT: 24 U/L (ref 0–55)
ANION GAP: 12 meq/L — AB (ref 3–11)
AST: 14 U/L (ref 5–34)
BUN: 15.2 mg/dL (ref 7.0–26.0)
CALCIUM: 9.5 mg/dL (ref 8.4–10.4)
CHLORIDE: 107 meq/L (ref 98–109)
CO2: 22 mEq/L (ref 22–29)
CREATININE: 1.5 mg/dL — AB (ref 0.6–1.1)
EGFR: 43 mL/min/{1.73_m2} — ABNORMAL LOW (ref >90)
Glucose: 187 mg/dl — ABNORMAL HIGH (ref 70–140)
POTASSIUM: 3.7 meq/L (ref 3.5–5.1)
Sodium: 142 mEq/L (ref 136–145)
Total Bilirubin: 0.47 mg/dL (ref 0.20–1.20)
Total Protein: 7.8 g/dL (ref 6.4–8.3)

## 2016-06-17 MED ORDER — TAMOXIFEN CITRATE 20 MG PO TABS: 20.0000 mg | ORAL_TABLET | Freq: Every day | ORAL | Status: AC

## 2016-06-17 NOTE — Telephone Encounter (Signed)
appt made and avs printed °

## 2016-06-17 NOTE — Progress Notes (Signed)
Audrey Fischer  Telephone:([phone]) [phone] Fax:(336) [phone]     ID: Audrey Fischer DOB: 1956-09-25  MR#: [id number]  JYN#:[id number]  Patient Care Team: Hoyt Koch, MD as PCP - General (Internal Medicine) Chauncey Cruel, MD as Consulting Physician (Oncology) Autumn Messing III, MD as Consulting Physician (General Surgery) Kyung Rudd, MD as Consulting Physician (Radiation Oncology) Eldred Manges, MD as Consulting Physician (Obstetrics and Gynecology) Jerene Bears, MD as Consulting Physician (Gastroenterology) Sylvan Cheese, NP as Nurse Practitioner (Hematology and Oncology) OTHER MD: Ronnette Hila MD  CHIEF COMPLAINT: estrogen receptor positive breast cancer  CURRENT TREATMENT: tamoxifen   BREAST CANCER HISTORY: From the original intake note:  Audrey Fischer tells me after her husband's death in Feb 15, 2006 she neglected her own health and did not see a doctor for several years. More recently she had a menstrual period after thinking she was in menopause. This took her to Dr. Leo Grosser who is in the process of investigating the bleeding problems. She also updated Audrey Fischer's health maintenance with mammography at [facility] obtained 07/02/2015. This showed a suspicious finding in the right breast and Audrey Fischer was referred to the breast Center where on 07/07/2015 she had bilateral diagnostic mammography with tomosynthesis and right breast ultrasonography. The breast density was category D.   In the left breast there was a small mass in the anterior aspect of the lower outer quadrant. By tomography this was irregular and spiculated. On physical exam it measured approximately 1.2 cm in the 7:00 position immediately adjacent to the nipple. Ultrasound found a 1.3 cm hypoechoic mass in the area in question. There were also 3 small oval hypoechoic masses containing echogenic material, the largest measuring 4 mm, with a thin internal septation. Ultrasound of the left axilla was  unremarkable.   biopsy of the left breast mass in question 07/09/2015 showed ([id number]) and invasive ductal carcinoma, grade 1, estrogen receptor 100% positive, progesterone receptor 100% positive, with an MIB-1 of 10%, and no HER-2 amplification, the signals ratio being 1.62 and the number per cell 2.10.  The patient's subsequent history is as detailed below  INTERVAL HISTORY: how on TAM  Audrey Fischer returns today for follow up of her estrogen receptor positive breast cancer. She started tamoxifen April of this year. She has noted more hot flashes particularly during the day. I don't regular up at night. She has not had a vaginal wetness. Recall that she had stopped having periods before starting chemotherapy. She obtains the drug at a good price.  She participated in finding [name] normal and found that very useful   REVIEW OF SYSTEMS: She has been exercising very aggressively, with a trainer, who is also a nutritionist. She has an excellent diet. Despite this she actually has gained some weight. She has lost some inches, which is the positive side. She wonders if her thyroid is not working correctly possibly she thinks because of radiation. Aside from this a detailed review of systems today was noncontributory  PAST MEDICAL HISTORY: Past Medical History  Diagnosis Date  . Hypertension   . Hyperlipidemia   . Breast cancer ([place]) 07/09/15    left breast,  lower-inner quadrant   . Coronary artery disease     cardiologist-  dr Donnetta Hutching [facility], [facility] cardiology in [facility])---  hx PCI w/ stents to OM and LAD 02-08-2008  . S/P coronary artery stent placement   . PMB (postmenopausal bleeding)     PAST SURGICAL HISTORY: Past Surgical History  Procedure Laterality Date  . Cesarean section  10-09-1996  . Coronary angioplasty with stent placement  02-08-2008   dr Donnetta Hutching    PCI and stent to OM and LAD  . Dilation and curettage of uterus  1994    blighted ovum  . Dilatation &  currettage/hysteroscopy with resectocope N/A 08/15/2015    Procedure: DILATATION & CURETTAGE/HYSTEROSCOPY ;  Surgeon: Eldred Manges, MD;  Location: [facility];  Service: Gynecology;  Laterality: N/A;    FAMILY HISTORY Family History  Problem Relation Age of Onset  . Heart disease Mother   . Stroke Mother   . Colon cancer Mother 106  . Heart disease Father   The patient's father died at the age of 49 from a myocardial infarction. The patient's mother died at the age of 92, with a stroke. Both have significant diabetes problems. The patient has one brother, no sisters. On the mother's side 1 aunt had stomach cancer one cousin prostate cancer. The patient's own mother was diagnosed with colon cancer at age 15. There is no history of breast, ovarian, or uterine cancer in the family to the patient's knowledge.   GYNECOLOGIC HISTORY:  No LMP recorded. Patient is postmenopausal.  menarche age 69. First live birth age 51. The patient is GX P2. She never took hormone replacement. She did use oral contraceptives remotely, without complication  SOCIAL HISTORY:  Audrey Fischer teaches middle school math. Her husband died from infectious complications following back surgery in 2007. Her children are Audrey Fischer who is a Equities trader at [employer] in [place] [employer], and Audrey Fischer, and freshman at [employer] with an eye to studying physical therapy the patient attends [place]: Not in place. At the initial clinic visit  the patient was given the appropriate forms to complete and notarize at her discretion.   HEALTH MAINTENANCE: Social History  Substance Use Topics  . Smoking status: Never Smoker   . Smokeless tobacco: Never Used  . Alcohol Use: 0.0 oz/week    0 Standard drinks or equivalent per week     Comment: occasionally     Colonoscopy:07/21/2015 / [facility]  PAP: UTD/ [facility]  Bone  density:  Lipid panel:  Allergies  Allergen Reactions  . Capsule [Gelatin] Hives    Any medication that is in gel capsule form    Current Outpatient Prescriptions  Medication Sig Dispense Refill  . aspirin 81 MG tablet Take 81 mg by mouth daily.    Marland Kitchen atenolol (TENORMIN) 50 MG tablet Take 50 mg by mouth every evening.     . Cholecalciferol (VITAMIN D3) 2000 UNITS TABS Take 1 tablet by mouth daily.    . nitroGLYCERIN (NITROSTAT) 0.4 MG SL tablet Place 0.4 mg under the tongue. Reported on 01/30/2016    . rosuvastatin (CRESTOR) 10 MG tablet Take 10 mg by mouth every evening.      No current facility-administered medications for this visit.    OBJECTIVE: middle-aged African-American woman in no acute distress  Filed Vitals:   06/17/16 1519  BP: 173/78  Pulse: 73  Temp: 98 F (36.7 C)  Resp: 18     Body mass index is 29.65 kg/(m^2).    ECOG FS:0 - Asymptomatic  Sclerae unicteric, pupils round and equal Oropharynx clear and moist-- no thrush or other lesions No cervical or supraclavicular adenopathy Lungs no rales or rhonchi Heart regular rate and rhythm Abd soft, nontender, positive bowel sounds  MSK no focal spinal tenderness, no upper extremity lymphedema Neuro: nonfocal, well oriented, appropriate affect Breasts: The right breast is unremarkable. The left breast is status post lumpectomy and radiation. There is still hyperpigmentation. There is no evidence of local recurrence. The left axilla is benign.   LAB RESULTS:  CMP     Component Value Date/Time   NA 142 07/30/2015 1620   K 4.0 07/30/2015 1620   CO2 23 07/30/2015 1620   GLUCOSE 125 07/30/2015 1620   BUN 11.2 07/30/2015 1620   CREATININE 1.4* 07/30/2015 1620   CALCIUM 9.7 07/30/2015 1620   PROT 7.3 07/30/2015 1620   ALBUMIN 4.0 07/30/2015 1620   AST 16 07/30/2015 1620   ALT 25 07/30/2015 1620   ALKPHOS 71 07/30/2015 1620   BILITOT 0.49 07/30/2015 1620    INo results found for: SPEP, UPEP  Lab Results   Component Value Date   WBC 4.7 06/17/2016   NEUTROABS 3.7 06/17/2016   HGB 14.3 06/17/2016   HCT 41.7 06/17/2016   MCV 81.6 06/17/2016   PLT 170 06/17/2016      Chemistry      Component Value Date/Time   NA 142 07/30/2015 1620   K 4.0 07/30/2015 1620   CO2 23 07/30/2015 1620   BUN 11.2 07/30/2015 1620   CREATININE 1.4* 07/30/2015 1620      Component Value Date/Time   CALCIUM 9.7 07/30/2015 1620   ALKPHOS 71 07/30/2015 1620   AST 16 07/30/2015 1620   ALT 25 07/30/2015 1620   BILITOT 0.49 07/30/2015 1620       No results found for: LABCA2  No components found for: LABCA125  No results for input(s): INR in the last 168 hours.  Urinalysis No results found for: COLORURINE, APPEARANCEUR, LABSPEC, PHURINE, GLUCOSEU, HGBUR, BILIRUBINUR, KETONESUR, PROTEINUR, UROBILINOGEN, NITRITE, LEUKOCYTESUR  STUDIES: No results found.  ASSESSMENT: 60 y.o. [place], [place] Woman status post central left breast biopsy 07/09/2015 for a clinical T1c N0, stage IA invasive ductal carcinoma, grade 1, estrogen and progesterone receptor strongly positive, HER-2 not amplified, with an MIB-1 of 10%   (1) status post left central mastectomy with nipple resection and excised sentinel lymph node sampling at [facility] 09/23/2015--for a pT1c pN0, stage IA invasive ductal carcinoma  (2) Oncotype DX Score of 11 predicts a 7% risk of recurrence outside the breast within 10 years if her only systemic therapy is tamoxifen for 5 years. It also predicts no benefit from chemotherapy.  (3) adjuvant radiation 12/22/15 - 02/05/16:  Left breast treated to 50.4 Gy in 28 fractions, Left breast boost treated to 10 Gy in 5 fractions   (4) tamoxifen started April 2017  PLAN: Ethleen is tolerating the tamoxifen well and the plan will be to continue at least 2 years before considering switching to an aromatase inhibitor.  With spelled it total of slightly over 40 minutes reviewing her weight issues. She is very  concerned that she is hypothyroid. I explained that we are very careful with radiation and there is very little scatter to the thyroid. Even if there has been some radiation to the thyroid, it would be minimal, and it would be too soon to see much of a change in function. In short I don't believe she is hypothyroid but at her request we are going ahead and checking her thyroid panel next week.  We discussed the fact that exercise alone generally cannot cause a weight loss. The amount of calories in has to be less than the  amount of calories burn. Exercise helps increasing the amount of calories per. However she has to lower her calories and she is currently on a diet that very slowly removes carbohydrates from the diet until she starts losing weight.   She understands that as soon as she starts losing weight her body's metabolism is slow down and it will become even harder for her to lose more weight if she continues on the pattern of cutting on calories gradually and following her weight closely, so she is going in the right direction she can get to her goal of 140 pounds. She understands also that muscle weighs more than fat and that may skew her measurements somewhat.  When she gets to 140 pounds her body's metabolism is likely to continue to want to slow down, despite her exercise, and she may have to cut her calories even more.  It will be important for her to eat a very varied diet. She does appear to be doing this at present.  She will be seeing Dr. Marlou Starks in October, after her mammography. She will also follow-up with her primary care physician. She will return to see me in April.   She will call with any problems that may develop before her next visit here.  Chauncey Cruel, MD   06/17/2016 3:28 PM Medical Oncology and Hematology [facility] [address] [place], [place] [address] Tel. [phone]    Fax. [phone]

## 2016-06-18 LAB — FOLLICLE STIMULATING HORMONE: FSH: 63.1 m[IU]/mL

## 2016-06-22 ENCOUNTER — Other Ambulatory Visit (HOSPITAL_BASED_OUTPATIENT_CLINIC_OR_DEPARTMENT_OTHER): Payer: BC Managed Care – PPO

## 2016-06-22 DIAGNOSIS — C50112 Malignant neoplasm of central portion of left female breast: Secondary | ICD-10-CM

## 2016-06-23 LAB — THYROID PANEL WITH TSH
Free Thyroxine Index: 2 (ref 1.2–4.9)
T3 Uptake Ratio: 27 % (ref 24–39)
TSH: 1.08 u[IU]/mL (ref 0.450–4.500)
Thyroxine (T4): 7.5 ug/dL (ref 4.5–12.0)

## 2016-06-24 ENCOUNTER — Encounter: Payer: Self-pay | Admitting: Oncology

## 2016-06-24 LAB — ESTRADIOL, ULTRA SENS: Estradiol, Sensitive: 10.5 pg/mL

## 2016-06-24 NOTE — Progress Notes (Signed)
Patient emailed form and it was signed by dr. I called to let her know ready for pick up and emailing her copy.

## 2016-06-24 NOTE — Progress Notes (Signed)
Patient said never recd copy of cancer policy form. She will email it to me today and I will get it right back to her.

## 2016-08-05 ENCOUNTER — Telehealth: Payer: Self-pay

## 2016-08-05 NOTE — Telephone Encounter (Signed)
Called pt to let her know lab results from 09/03/16 to 09/23/16 are available for pickup. They are RN desk for pt to sign release of information form

## 2016-10-01 ENCOUNTER — Encounter: Payer: Self-pay | Admitting: Internal Medicine

## 2016-10-01 ENCOUNTER — Ambulatory Visit (INDEPENDENT_AMBULATORY_CARE_PROVIDER_SITE_OTHER): Payer: BC Managed Care – PPO | Admitting: Internal Medicine

## 2016-10-01 VITALS — BP 132/76 | HR 69 | Temp 98.3°F | Resp 16 | Ht 68.5 in | Wt 195.1 lb

## 2016-10-01 DIAGNOSIS — Z0000 Encounter for general adult medical examination without abnormal findings: Secondary | ICD-10-CM | POA: Diagnosis not present

## 2016-10-01 DIAGNOSIS — Z1159 Encounter for screening for other viral diseases: Secondary | ICD-10-CM | POA: Diagnosis not present

## 2016-10-01 DIAGNOSIS — E785 Hyperlipidemia, unspecified: Secondary | ICD-10-CM

## 2016-10-01 DIAGNOSIS — I10 Essential (primary) hypertension: Secondary | ICD-10-CM

## 2016-10-01 DIAGNOSIS — Z23 Encounter for immunization: Secondary | ICD-10-CM

## 2016-10-01 NOTE — Patient Instructions (Signed)
We have given you the tetanus and the flu shot.   We will check the labs and the cholesterol.   Health Maintenance, Female Adopting a healthy lifestyle and getting preventive care can go a long way to promote health and wellness. Talk with your health care provider about what schedule of regular examinations is right for you. This is a good chance for you to check in with your provider about disease prevention and staying healthy. In between checkups, there are plenty of things you can do on your own. Experts have done a lot of research about which lifestyle changes and preventive measures are most likely to keep you healthy. Ask your health care provider for more information. WEIGHT AND DIET  Eat a healthy diet  Be sure to include plenty of vegetables, fruits, low-fat dairy products, and lean protein.  Do not eat a lot of foods high in solid fats, added sugars, or salt.  Get regular exercise. This is one of the most important things you can do for your health.  Most adults should exercise for at least 150 minutes each week. The exercise should increase your heart rate and make you sweat (moderate-intensity exercise).  Most adults should also do strengthening exercises at least twice a week. This is in addition to the moderate-intensity exercise.  Maintain a healthy weight  Body mass index (BMI) is a measurement that can be used to identify possible weight problems. It estimates body fat based on height and weight. Your health care provider can help determine your BMI and help you achieve or maintain a healthy weight.  For females 61 years of age and older:   A BMI below 18.5 is considered underweight.  A BMI of 18.5 to 24.9 is normal.  A BMI of 25 to 29.9 is considered overweight.  A BMI of 30 and above is considered obese.  Watch levels of cholesterol and blood lipids  You should start having your blood tested for lipids and cholesterol at 58 years of age, then have this test  every 5 years.  You may need to have your cholesterol levels checked more often if:  Your lipid or cholesterol levels are high.  You are older than 58 years of age.  You are at high risk for heart disease.  CANCER SCREENING   Lung Cancer  Lung cancer screening is recommended for adults 60-53 years old who are at high risk for lung cancer because of a history of smoking.  A yearly low-dose CT scan of the lungs is recommended for people who:  Currently smoke.  Have quit within the past 15 years.  Have at least a 30-pack-year history of smoking. A pack year is smoking an average of one pack of cigarettes a day for 1 year.  Yearly screening should continue until it has been 15 years since you quit.  Yearly screening should stop if you develop a health problem that would prevent you from having lung cancer treatment.  Breast Cancer  Practice breast self-awareness. This means understanding how your breasts normally appear and feel.  It also means doing regular breast self-exams. Let your health care provider know about any changes, no matter how small.  If you are in your 20s or 30s, you should have a clinical breast exam (CBE) by a health care provider every 1-3 years as part of a regular health exam.  If you are 27 or older, have a CBE every year. Also consider having a breast X-ray (mammogram) every year.  If you have a family history of breast cancer, talk to your health care provider about genetic screening.  If you are at high risk for breast cancer, talk to your health care provider about having an MRI and a mammogram every year.  Breast cancer gene (BRCA) assessment is recommended for women who have family members with BRCA-related cancers. BRCA-related cancers include:  Breast.  Ovarian.  Tubal.  Peritoneal cancers.  Results of the assessment will determine the need for genetic counseling and BRCA1 and BRCA2 testing. Cervical Cancer Your health care provider  may recommend that you be screened regularly for cancer of the pelvic organs (ovaries, uterus, and vagina). This screening involves a pelvic examination, including checking for microscopic changes to the surface of your cervix (Pap test). You may be encouraged to have this screening done every 3 years, beginning at age 43.  For women ages 59-65, health care providers may recommend pelvic exams and Pap testing every 3 years, or they may recommend the Pap and pelvic exam, combined with testing for human papilloma virus (HPV), every 5 years. Some types of HPV increase your risk of cervical cancer. Testing for HPV may also be done on women of any age with unclear Pap test results.  Other health care providers may not recommend any screening for nonpregnant women who are considered low risk for pelvic cancer and who do not have symptoms. Ask your health care provider if a screening pelvic exam is right for you.  If you have had past treatment for cervical cancer or a condition that could lead to cancer, you need Pap tests and screening for cancer for at least 20 years after your treatment. If Pap tests have been discontinued, your risk factors (such as having a new sexual partner) need to be reassessed to determine if screening should resume. Some women have medical problems that increase the chance of getting cervical cancer. In these cases, your health care provider may recommend more frequent screening and Pap tests. Colorectal Cancer  This type of cancer can be detected and often prevented.  Routine colorectal cancer screening usually begins at 58 years of age and continues through 58 years of age.  Your health care provider may recommend screening at an earlier age if you have risk factors for colon cancer.  Your health care provider may also recommend using home test kits to check for hidden blood in the stool.  A small camera at the end of a tube can be used to examine your colon directly  (sigmoidoscopy or colonoscopy). This is done to check for the earliest forms of colorectal cancer.  Routine screening usually begins at age 55.  Direct examination of the colon should be repeated every 5-10 years through 58 years of age. However, you may need to be screened more often if early forms of precancerous polyps or small growths are found. Skin Cancer  Check your skin from head to toe regularly.  Tell your health care provider about any new moles or changes in moles, especially if there is a change in a mole's shape or color.  Also tell your health care provider if you have a mole that is larger than the size of a pencil eraser.  Always use sunscreen. Apply sunscreen liberally and repeatedly throughout the day.  Protect yourself by wearing long sleeves, pants, a wide-brimmed hat, and sunglasses whenever you are outside. HEART DISEASE, DIABETES, AND HIGH BLOOD PRESSURE   High blood pressure causes heart disease and increases the risk  of stroke. High blood pressure is more likely to develop in:  People who have blood pressure in the high end of the normal range (130-139/85-89 mm Hg).  People who are overweight or obese.  People who are African American.  If you are 77-22 years of age, have your blood pressure checked every 3-5 years. If you are 9 years of age or older, have your blood pressure checked every year. You should have your blood pressure measured twice--once when you are at a hospital or clinic, and once when you are not at a hospital or clinic. Record the average of the two measurements. To check your blood pressure when you are not at a hospital or clinic, you can use:  An automated blood pressure machine at a pharmacy.  A home blood pressure monitor.  If you are between 44 years and 53 years old, ask your health care provider if you should take aspirin to prevent strokes.  Have regular diabetes screenings. This involves taking a blood sample to check your  fasting blood sugar level.  If you are at a normal weight and have a low risk for diabetes, have this test once every three years after 58 years of age.  If you are overweight and have a high risk for diabetes, consider being tested at a younger age or more often. PREVENTING INFECTION  Hepatitis B  If you have a higher risk for hepatitis B, you should be screened for this virus. You are considered at high risk for hepatitis B if:  You were born in a country where hepatitis B is common. Ask your health care provider which countries are considered high risk.  Your parents were born in a high-risk country, and you have not been immunized against hepatitis B (hepatitis B vaccine).  You have HIV or AIDS.  You use needles to inject street drugs.  You live with someone who has hepatitis B.  You have had sex with someone who has hepatitis B.  You get hemodialysis treatment.  You take certain medicines for conditions, including cancer, organ transplantation, and autoimmune conditions. Hepatitis C  Blood testing is recommended for:  Everyone born from [birth year] through 1963.  Anyone with known risk factors for hepatitis C. Sexually transmitted infections (STIs)  You should be screened for sexually transmitted infections (STIs) including gonorrhea and chlamydia if:  You are sexually active and are younger than 58 years of age.  You are older than 58 years of age and your health care provider tells you that you are at risk for this type of infection.  Your sexual activity has changed since you were last screened and you are at an increased risk for chlamydia or gonorrhea. Ask your health care provider if you are at risk.  If you do not have HIV, but are at risk, it may be recommended that you take a prescription medicine daily to prevent HIV infection. This is called pre-exposure prophylaxis (PrEP). You are considered at risk if:  You are sexually active and do not regularly use condoms or  know the HIV status of your partner(s).  You take drugs by injection.  You are sexually active with a partner who has HIV. Talk with your health care provider about whether you are at high risk of being infected with HIV. If you choose to begin PrEP, you should first be tested for HIV. You should then be tested every 3 months for as long as you are taking PrEP.  PREGNANCY  If you are premenopausal and you may become pregnant, ask your health care provider about preconception counseling.  If you may become pregnant, take 400 to 800 micrograms (mcg) of folic acid every day.  If you want to prevent pregnancy, talk to your health care provider about birth control (contraception). OSTEOPOROSIS AND MENOPAUSE   Osteoporosis is a disease in which the bones lose minerals and strength with aging. This can result in serious bone fractures. Your risk for osteoporosis can be identified using a bone density scan.  If you are 36 years of age or older, or if you are at risk for osteoporosis and fractures, ask your health care provider if you should be screened.  Ask your health care provider whether you should take a calcium or vitamin D supplement to lower your risk for osteoporosis.  Menopause may have certain physical symptoms and risks.  Hormone replacement therapy may reduce some of these symptoms and risks. Talk to your health care provider about whether hormone replacement therapy is right for you.  HOME CARE INSTRUCTIONS   Schedule regular health, dental, and eye exams.  Stay current with your immunizations.   Do not use any tobacco products including cigarettes, chewing tobacco, or electronic cigarettes.  If you are pregnant, do not drink alcohol.  If you are breastfeeding, limit how much and how often you drink alcohol.  Limit alcohol intake to no more than 1 drink per day for nonpregnant women. One drink equals 12 ounces of beer, 5 ounces of wine, or 1 ounces of hard liquor.  Do  not use street drugs.  Do not share needles.  Ask your health care provider for help if you need support or information about quitting drugs.  Tell your health care provider if you often feel depressed.  Tell your health care provider if you have ever been abused or do not feel safe at home.   This information is not intended to replace advice given to you by your health care provider. Make sure you discuss any questions you have with your health care provider.   Document Released: 05/31/2011 Document Revised: 12/06/2014 Document Reviewed: 10/17/2013 Elsevier Interactive Patient Education [employer].

## 2016-10-01 NOTE — Progress Notes (Signed)
Pre visit review using our clinic review tool, if applicable. No additional management support is needed unless otherwise documented below in the visit note. 

## 2016-10-01 NOTE — Progress Notes (Signed)
   Subjective:    Patient ID: Audrey Fischer, female    DOB: 1956-04-28, 60 y.o.   MRN: [id number]  HPI The patient is a 60 YO female coming in for wellness. No new concerns.  PMH, [facility], social history reviewed and updated.   Review of Systems  Constitutional: Negative.   HENT: Negative.   Eyes: Negative.   Respiratory: Negative for cough, chest tightness and shortness of breath.   Cardiovascular: Negative for chest pain, palpitations and leg swelling.  Gastrointestinal: Negative for abdominal distention, abdominal pain, constipation, diarrhea, nausea and vomiting.  Musculoskeletal: Negative.   Skin: Negative.   Neurological: Negative.   Psychiatric/Behavioral: Negative.       Objective:   Physical Exam  Constitutional: She is oriented to person, place, and time. She appears well-developed and well-nourished.  HENT:  Head: Normocephalic and atraumatic.  Eyes: EOM are normal.  Neck: Normal range of motion.  Cardiovascular: Normal rate and regular rhythm.   Pulmonary/Chest: Effort normal and breath sounds normal. No respiratory distress. She has no wheezes. She has no rales.  Abdominal: Soft. Bowel sounds are normal. She exhibits no distension. There is no tenderness. There is no rebound.  Musculoskeletal: She exhibits no edema.  Neurological: She is alert and oriented to person, place, and time. Coordination normal.  Skin: Skin is warm and dry.  Psychiatric: She has a normal mood and affect.   Vitals:   10/01/16 1458  BP: 132/76  Pulse: 69  Resp: 16  Temp: 98.3 F (36.8 C)  TempSrc: Oral  SpO2: 98%  Weight: 195 lb 1.9 oz (88.5 kg)  Height: 5' 8.5" (1.74 m)      Assessment & Plan:  Tdap and flu shot given at visit.

## 2016-10-02 ENCOUNTER — Encounter: Payer: Self-pay | Admitting: Internal Medicine

## 2016-10-02 DIAGNOSIS — Z0000 Encounter for general adult medical examination without abnormal findings: Secondary | ICD-10-CM | POA: Insufficient documentation

## 2016-10-02 NOTE — Assessment & Plan Note (Signed)
Checking lipid panel and adjust as needed. Taking crestor 10 mg daily.

## 2016-10-02 NOTE — Assessment & Plan Note (Signed)
Checking hep c screening. Tdap and flu shot given at visit. Counseled on exercise benefits and the dangers of distracted driving. Mammogram up to date. Colonoscopy up to date. Given screening recommendations.

## 2016-10-02 NOTE — Assessment & Plan Note (Signed)
BP at goal on atenolol. Checking labs and adjust as needed.

## 2016-10-07 ENCOUNTER — Encounter: Payer: Self-pay | Admitting: Internal Medicine

## 2016-10-07 ENCOUNTER — Ambulatory Visit (INDEPENDENT_AMBULATORY_CARE_PROVIDER_SITE_OTHER): Payer: BC Managed Care – PPO | Admitting: Internal Medicine

## 2016-10-07 VITALS — BP 140/78 | HR 78 | Temp 98.4°F | Resp 20 | Wt 199.0 lb

## 2016-10-07 DIAGNOSIS — L03113 Cellulitis of right upper limb: Secondary | ICD-10-CM | POA: Diagnosis not present

## 2016-10-07 DIAGNOSIS — I10 Essential (primary) hypertension: Secondary | ICD-10-CM

## 2016-10-07 MED ORDER — CEPHALEXIN 500 MG PO CAPS: 500.0000 mg | ORAL_CAPSULE | Freq: Three times a day (TID) | ORAL | 0 refills | Status: AC

## 2016-10-07 NOTE — Patient Instructions (Addendum)
Please take all new medication as prescribed - the antibiotic  Please continue all other medications as before, and refills have been done if requested.  Please have the pharmacy call with any other refills you may need.  Please keep your appointments with your specialists as you may have planned   

## 2016-10-07 NOTE — Progress Notes (Signed)
Pre visit review using our clinic review tool, if applicable. No additional management support is needed unless otherwise documented below in the visit note. 

## 2016-10-09 DIAGNOSIS — L03113 Cellulitis of right upper limb: Secondary | ICD-10-CM | POA: Insufficient documentation

## 2016-10-09 NOTE — Progress Notes (Signed)
   Subjective:    Patient ID: Audrey Fischer, female    DOB: 06/06/1956, 60 y.o.   MRN: [id number]  HPI  Here to f/u acute onset right arm red/tender/swelling without drainage or ulcer to right deltoid at the injection site from recent flu shot x 3 days. No fever, HA, ST, cough, sob, CP, n/v/d.  Pt denies chest pain, increased sob or doe, wheezing, orthopnea, PND, increased LE swelling, palpitations, dizziness or syncope. Past Medical History:  Diagnosis Date  . Breast cancer ([place]) 07/09/15   left breast,  lower-inner quadrant   . Coronary artery disease    cardiologist-  dr Donnetta Hutching [facility] regional physicians, [facility]ology in [facility])---  hx PCI w/ stents to OM and LAD 02-08-2008  . Hyperlipidemia   . Hypertension   . PMB (postmenopausal bleeding)   . S/P coronary artery stent placement    Past Surgical History:  Procedure Laterality Date  . CESAREAN SECTION  10-09-1996  . CORONARY ANGIOPLASTY WITH STENT PLACEMENT  02-08-2008   dr Donnetta Hutching   PCI and stent to OM and LAD  . DILATATION & CURRETTAGE/HYSTEROSCOPY WITH RESECTOCOPE N/A 08/15/2015   Procedure: DILATATION & CURETTAGE/HYSTEROSCOPY ;  Surgeon: Eldred Manges, MD;  Location: [place];  Service: Gynecology;  Laterality: N/A;  . DILATION AND CURETTAGE OF UTERUS  1994   blighted ovum    reports that she has never smoked. She has never used smokeless tobacco. She reports that she drinks alcohol. She reports that she does not use drugs. family history includes Colon cancer (age of onset: 23) in her mother; Heart disease in her father and mother; Stroke in her mother. Allergies  Allergen Reactions  . Capsule [Gelatin] Hives    Any medication that is in gel capsule form  . Capsaicin    Current Outpatient Prescriptions on File Prior to Visit  Medication Sig Dispense Refill  . aspirin 81 MG tablet Take 81 mg by mouth daily.    Marland Kitchen atenolol (TENORMIN) 50 MG tablet Take 50 mg by mouth every evening.     .  Cholecalciferol (VITAMIN D3) 2000 UNITS TABS Take 1 tablet by mouth daily.    . nitroGLYCERIN (NITROSTAT) 0.4 MG SL tablet Place 0.4 mg under the tongue. Reported on 01/30/2016    . rosuvastatin (CRESTOR) 10 MG tablet Take 10 mg by mouth every evening.      No current facility-administered medications on file prior to visit.    Review of Systems All otherwise neg per pt     Objective:   Physical Exam BP 140/78   Pulse 78   Temp 98.4 F (36.9 C) (Oral)   Resp 20   Wt 199 lb (90.3 kg)   SpO2 97%   BMI 29.82 kg/m  VS noted,  Constitutional: Pt appears in no apparent distress HENT: Head: NCAT.  Right Ear: External ear normal.  Left Ear: External ear normal.  Eyes: . Pupils are equal, round, and reactive to light. Conjunctivae and EOM are normal Neck: Normal range of motion. Neck supple.  Cardiovascular: Normal rate and regular rhythm.   Pulmonary/Chest: Effort normal and breath sounds without rales or wheezing.  Neurological: Pt is alert. Not confused , motor grossly intact Skin: Skin is warm. No rash, no LE edema, with right deltoid mid aspect with 1.5 cm area red/tender/swelling/induration without fluctuance, ulcer or drainage Psychiatric: Pt behavior is normal. No agitation.     Assessment & Plan:

## 2016-10-09 NOTE — Assessment & Plan Note (Signed)
Mild to mod, for antibx course,  to f/u any worsening symptoms or concerns 

## 2016-10-09 NOTE — Assessment & Plan Note (Signed)
stable overall by history and exam, recent data reviewed with pt, and pt to continue medical treatment as before,  to f/u any worsening symptoms or concerns BP Readings from Last 3 Encounters:  10/07/16 140/78  10/01/16 132/76  06/17/16 (!) 173/78

## 2016-12-23 ENCOUNTER — Ambulatory Visit (INDEPENDENT_AMBULATORY_CARE_PROVIDER_SITE_OTHER): Payer: BC Managed Care – PPO | Admitting: Internal Medicine

## 2016-12-23 ENCOUNTER — Encounter: Payer: Self-pay | Admitting: Internal Medicine

## 2016-12-23 DIAGNOSIS — H6121 Impacted cerumen, right ear: Secondary | ICD-10-CM

## 2016-12-23 NOTE — Patient Instructions (Signed)
Call us if you have more problems.

## 2016-12-23 NOTE — Progress Notes (Signed)
   Subjective:    Patient ID: Audrey Fischer, female    DOB: Jun 01, 1956, 61 y.o.   MRN: [id number]  HPI The patient is a 61 YO female coming in for right ear blockage. She had a couple of colds in December but they finally had cleared. Some hearing decrease in the ear. She denies nasal congestion. No fevers or chills. No cough or SOB. No pain in her ear. Is popping some extra.   Review of Systems  Constitutional: Negative for activity change, appetite change, chills, fatigue, fever and unexpected weight change.  HENT: Positive for ear pain. Negative for congestion, ear discharge, nosebleeds, postnasal drip, rhinorrhea, sinus pain, sinus pressure, sore throat and trouble swallowing.   Respiratory: Negative.   Cardiovascular: Negative.   Gastrointestinal: Negative.   Musculoskeletal: Negative.   Skin: Negative.       Objective:   Physical Exam  Constitutional: She appears well-developed and well-nourished.  HENT:  Head: Normocephalic and atraumatic.  Left Ear: External ear normal.  Mouth/Throat: Oropharynx is clear and moist.  Right ear with wax, after disimpaction TM normal.  Eyes: EOM are normal.  Neck: Normal range of motion.  Cardiovascular: Normal rate and regular rhythm.   Pulmonary/Chest: Effort normal and breath sounds normal.  Abdominal: Soft.  Skin: Skin is warm and dry.   Vitals:   12/23/16 1524  BP: (!) 150/90  Pulse: 69  Resp: 14  Temp: 97.4 F (36.3 C)  TempSrc: Oral  SpO2: 98%  Weight: 204 lb (92.5 kg)  Height: 5' 8.5" (1.74 m)      Assessment & Plan:

## 2016-12-23 NOTE — Assessment & Plan Note (Signed)
Right ear disimpacted and TM normal behind.

## 2016-12-23 NOTE — Progress Notes (Signed)
Pre visit review using our clinic review tool, if applicable. No additional management support is needed unless otherwise documented below in the visit note. 

## 2017-03-07 ENCOUNTER — Other Ambulatory Visit (INDEPENDENT_AMBULATORY_CARE_PROVIDER_SITE_OTHER): Payer: BC Managed Care – PPO

## 2017-03-07 DIAGNOSIS — Z0000 Encounter for general adult medical examination without abnormal findings: Secondary | ICD-10-CM

## 2017-03-07 DIAGNOSIS — Z1159 Encounter for screening for other viral diseases: Secondary | ICD-10-CM

## 2017-03-07 LAB — COMPREHENSIVE METABOLIC PANEL
ALT: 19 U/L (ref 0–35)
AST: 16 U/L (ref 0–37)
Albumin: 4.2 g/dL (ref 3.5–5.2)
Alkaline Phosphatase: 51 U/L (ref 39–117)
BILIRUBIN TOTAL: 0.4 mg/dL (ref 0.2–1.2)
BUN: 15 mg/dL (ref 6–23)
CO2: 27 meq/L (ref 19–32)
Calcium: 9.6 mg/dL (ref 8.4–10.5)
Chloride: 106 mEq/L (ref 96–112)
Creatinine, Ser: 1.36 mg/dL — ABNORMAL HIGH (ref 0.40–1.20)
GFR: 51.27 mL/min — AB (ref >60.00)
GLUCOSE: 108 mg/dL — AB (ref 70–99)
Potassium: 4.2 mEq/L (ref 3.5–5.1)
SODIUM: 141 meq/L (ref 135–145)
Total Protein: 7.3 g/dL (ref 6.0–8.3)

## 2017-03-07 LAB — CBC
HCT: 39 % (ref 36.0–46.0)
Hemoglobin: 13.3 g/dL (ref 12.0–15.0)
MCHC: 34.1 g/dL (ref 30.0–36.0)
MCV: 83.8 fl (ref 78.0–100.0)
PLATELETS: 190 10*3/uL (ref 150.0–400.0)
RBC: 4.65 Mil/uL (ref 3.87–5.11)
RDW: 13.9 % (ref 11.5–15.5)
WBC: 6.4 10*3/uL (ref 4.0–10.5)

## 2017-03-07 LAB — LIPID PANEL
CHOL/HDL RATIO: 4
Cholesterol: 178 mg/dL (ref 0–200)
HDL: 44.2 mg/dL (ref >39.00)
LDL Cholesterol: 102 mg/dL — ABNORMAL HIGH (ref 0–99)
NonHDL: 134.28
Triglycerides: 160 mg/dL — ABNORMAL HIGH (ref 0.0–149.0)
VLDL: 32 mg/dL (ref 0.0–40.0)

## 2017-03-08 LAB — HEPATITIS C ANTIBODY: HCV Ab: NEGATIVE

## 2017-03-14 ENCOUNTER — Telehealth: Payer: Self-pay | Admitting: Oncology

## 2017-03-14 NOTE — Telephone Encounter (Signed)
Left a message with about appointment changes and a call back number if it would not work for her

## 2017-03-15 ENCOUNTER — Other Ambulatory Visit: Payer: Self-pay

## 2017-03-15 DIAGNOSIS — C50112 Malignant neoplasm of central portion of left female breast: Secondary | ICD-10-CM

## 2017-03-16 ENCOUNTER — Other Ambulatory Visit: Payer: BC Managed Care – PPO

## 2017-03-23 ENCOUNTER — Ambulatory Visit: Payer: BC Managed Care – PPO | Admitting: Oncology

## 2017-03-28 ENCOUNTER — Other Ambulatory Visit (HOSPITAL_BASED_OUTPATIENT_CLINIC_OR_DEPARTMENT_OTHER): Payer: BC Managed Care – PPO

## 2017-03-28 ENCOUNTER — Ambulatory Visit (HOSPITAL_BASED_OUTPATIENT_CLINIC_OR_DEPARTMENT_OTHER): Payer: BC Managed Care – PPO | Admitting: Oncology

## 2017-03-28 DIAGNOSIS — C50512 Malignant neoplasm of lower-outer quadrant of left female breast: Secondary | ICD-10-CM

## 2017-03-28 DIAGNOSIS — Z170 Estrogen receptor positive status [ER+]: Secondary | ICD-10-CM | POA: Diagnosis not present

## 2017-03-28 DIAGNOSIS — C50112 Malignant neoplasm of central portion of left female breast: Secondary | ICD-10-CM

## 2017-03-28 DIAGNOSIS — C50812 Malignant neoplasm of overlapping sites of left female breast: Secondary | ICD-10-CM | POA: Insufficient documentation

## 2017-03-28 DIAGNOSIS — Z79810 Long term (current) use of selective estrogen receptor modulators (SERMs): Secondary | ICD-10-CM | POA: Diagnosis not present

## 2017-03-28 LAB — CBC WITH DIFFERENTIAL/PLATELET
BASO%: 0.7 % (ref 0.0–2.0)
Basophils Absolute: 0 10*3/uL (ref 0.0–0.1)
EOS ABS: 0.1 10*3/uL (ref 0.0–0.5)
EOS%: 1.3 % (ref 0.0–7.0)
HEMATOCRIT: 40.3 % (ref 34.8–46.6)
HEMOGLOBIN: 13.5 g/dL (ref 11.6–15.9)
LYMPH#: 1.2 10*3/uL (ref 0.9–3.3)
LYMPH%: 21.7 % (ref 14.0–49.7)
MCH: 28.2 pg (ref 25.1–34.0)
MCHC: 33.4 g/dL (ref 31.5–36.0)
MCV: 84.4 fL (ref 79.5–101.0)
MONO#: 0.5 10*3/uL (ref 0.1–0.9)
MONO%: 9.1 % (ref 0.0–14.0)
NEUT%: 67.2 % (ref 38.4–76.8)
NEUTROS ABS: 3.8 10*3/uL (ref 1.5–6.5)
PLATELETS: 199 10*3/uL (ref 145–400)
RBC: 4.77 10*6/uL (ref 3.70–5.45)
RDW: 13.8 % (ref 11.2–14.5)
WBC: 5.6 10*3/uL (ref 3.9–10.3)

## 2017-03-28 LAB — COMPREHENSIVE METABOLIC PANEL
ALBUMIN: 3.8 g/dL (ref 3.5–5.0)
ALK PHOS: 63 U/L (ref 40–150)
ALT: 19 U/L (ref 0–55)
ANION GAP: 11 meq/L (ref 3–11)
AST: 16 U/L (ref 5–34)
BILIRUBIN TOTAL: 0.37 mg/dL (ref 0.20–1.20)
BUN: 11.5 mg/dL (ref 7.0–26.0)
CO2: 22 meq/L (ref 22–29)
CREATININE: 1.5 mg/dL — AB (ref 0.6–1.1)
Calcium: 9.4 mg/dL (ref 8.4–10.4)
Chloride: 109 mEq/L (ref 98–109)
EGFR: 45 mL/min/{1.73_m2} — AB (ref >90)
Glucose: 154 mg/dl — ABNORMAL HIGH (ref 70–140)
Potassium: 3.9 mEq/L (ref 3.5–5.1)
Sodium: 141 mEq/L (ref 136–145)
TOTAL PROTEIN: 7.2 g/dL (ref 6.4–8.3)

## 2017-03-28 NOTE — Progress Notes (Signed)
[place]  [phone] Fax:([phone]     ID: Audrey Fischer DOB: [birth year]  MR#: [id number]  VWP#:[id number]  Patient Care Team: Hoyt Koch, MD as PCP - General (Internal Medicine) Chauncey Cruel, MD as Consulting Physician (Oncology) Autumn Messing III, MD as Consulting Physician (General Surgery) Kyung Rudd, MD as Consulting Physician (Radiation Oncology) Eldred Manges, MD as Consulting Physician (Obstetrics and Gynecology) Jerene Bears, MD as Consulting Physician (Gastroenterology) Sylvan Cheese, NP as Nurse Practitioner (Hematology and Oncology) OTHER MD: Ronnette Hila MD  CHIEF COMPLAINT: estrogen receptor positive breast cancer  CURRENT TREATMENT: tamoxifen   BREAST CANCER HISTORY: From the original intake note:  Audrey Fischer tells me after her husband's death in 2006/02/08 she neglected her own health and did not see a doctor for several years. More recently she had a menstrual period after thinking she was in menopause. This took her to Dr. Leo Grosser who is in the process of investigating the bleeding problems. She also updated Audrey Fischer's health maintenance with mammography at [facility] obtained 07/02/2015. This showed a suspicious finding in the right breast and [name] was referred to the breast Center where on 07/07/2015 she had bilateral diagnostic mammography with tomosynthesis and right breast ultrasonography. The breast density was category D.   In the left breast there was a small mass in the anterior aspect of the lower outer quadrant. By tomography this was irregular and spiculated. On physical exam it measured approximately 1.2 cm in the 7:00 position immediately adjacent to the nipple. Ultrasound found a 1.3 cm hypoechoic mass in the area in question. There were also 3 small oval hypoechoic masses containing echogenic material, the largest measuring 4 mm, with a thin internal septation. Ultrasound of the left axilla was  unremarkable.   biopsy of the left breast mass in question 07/09/2015 showed ([id number]) and invasive ductal carcinoma, grade 1, estrogen receptor 100% positive, progesterone receptor 100% positive, with an MIB-1 of 10%, and no HER-2 amplification, the signals ratio being 1.62 and the number per cell 2.10.  The patient's subsequent history is as detailed below  INTERVAL HISTORY: how on TAM  Audrey Fischer returns today for follow-up of her estrogen receptor positive breast cancer. She continues on tamoxifen, generally with good tolerance. She does have some hot flashes. She does not have problems with vaginal dryness or vaginal discharge. She obtains a drug at a good price.  She was concerned because she did not remember that we had discussed tamoxifen can cause endometrial cancer. She wondered if she needed to make a change.   REVIEW OF SYSTEMS: She continues to work full-time and in addition she has accepted a four-week exercise challenge which is very intense. She has occasional aches and pains related to this. Aside from that a detailed review of systems today was stable  PAST MEDICAL HISTORY: Past Medical History:  Diagnosis Date  . Breast cancer ([facility]) 07/09/15   left breast,  lower-inner quadrant   . Coronary artery disease    cardiologist-  dr Donnetta Hutching [facility], [facility] cardiology in [facility])---  hx PCI w/ stents to OM and LAD 02-08-2008  . Hyperlipidemia   . Hypertension   . PMB (postmenopausal bleeding)   . S/P coronary artery stent placement     PAST SURGICAL HISTORY: Past Surgical History:  Procedure Laterality Date  . CESAREAN SECTION  10-09-1996  . CORONARY ANGIOPLASTY WITH STENT PLACEMENT  02-08-2008   dr Donnetta Hutching   PCI and stent  to OM and LAD  . DILATATION & CURRETTAGE/HYSTEROSCOPY WITH RESECTOCOPE N/A 08/15/2015   Procedure: DILATATION & CURETTAGE/HYSTEROSCOPY ;  Surgeon: Eldred Manges, MD;  Location: [facility];  Service: Gynecology;   Laterality: N/A;  . DILATION AND CURETTAGE OF UTERUS  1994   blighted ovum    FAMILY HISTORY Family History  Problem Relation Age of Onset  . Heart disease Mother   . Stroke Mother   . Colon cancer Mother 19  . Heart disease Father   The patient's father died at the age of 69 from a myocardial infarction. The patient's mother died at the age of 91, with a stroke. Both have significant diabetes problems. The patient has one brother, no sisters. On the mother's side 1 aunt had stomach cancer one cousin prostate cancer. The patient's own mother was diagnosed with colon cancer at age 25. There is no history of breast, ovarian, or uterine cancer in the family to the patient's knowledge.   GYNECOLOGIC HISTORY:  No LMP recorded. Patient is postmenopausal.  menarche age 66. First live birth age 68. The patient is GX P2. She never took hormone replacement. She did use oral contraceptives remotely, without complication  SOCIAL HISTORY:  Tamora teaches middle school math. Her husband died from infectious complications following back surgery in 2007. Her children are Audrey Fischer who is a Equities trader at [employer] in [place], and Audrey Fischer, and freshman at [employer] with an eye to studying physical therapy the patient attends Hasty: Not in place. At the initial clinic visit  the patient was given the appropriate forms to complete and notarize at her discretion.   HEALTH MAINTENANCE: Social History  Substance Use Topics  . Smoking status: Never Smoker  . Smokeless tobacco: Never Used  . Alcohol use 0.0 oz/week     Comment: occasionally     Colonoscopy:07/21/2015 / [facility]  PAP: UTD/ [facility]  Bone density:  Lipid panel:  Allergies  Allergen Reactions  . Capsule [Gelatin] Hives    Any medication that is in gel capsule form  . Capsaicin     Current Outpatient Prescriptions  Medication Sig  Dispense Refill  . aspirin 81 MG tablet Take 81 mg by mouth daily.    Marland Kitchen atenolol (TENORMIN) 50 MG tablet Take 50 mg by mouth every evening.     . Cholecalciferol (VITAMIN D3) 2000 UNITS TABS Take 1 tablet by mouth daily.    . nitroGLYCERIN (NITROSTAT) 0.4 MG SL tablet Place 0.4 mg under the tongue. Reported on 01/30/2016    . rosuvastatin (CRESTOR) 10 MG tablet Take 10 mg by mouth every evening.     . tamoxifen (NOLVADEX) 20 MG tablet TAKE 1 TABLET (20 MG TOTAL) BY MOUTH DAILY.  12   No current facility-administered medications for this visit.     OBJECTIVE: middle-aged African-American woman Who appears fatigued Vitals:   03/28/17 1609  BP: (!) 155/76  Pulse: (!) 54  Resp: 18  Temp: 98.4 F (36.9 C)     Body mass index is 30.46 kg/m.    ECOG FS:0 - Asymptomatic  Sclerae unicteric, EOMs intact Oropharynx clear and moist No cervical or supraclavicular adenopathy Lungs no rales or rhonchi Heart regular rate and rhythm Abd soft, nontender, positive bowel sounds MSK no focal spinal tenderness, no upper extremity lymphedema Neuro: nonfocal, well oriented, appropriate affect Breasts: The right breast is benign. Left breast is undergone lumpectomy  and radiation, with no evidence of local recurrence. Both axillae are benign.   LAB RESULTS:  CMP     Component Value Date/Time   NA 141 03/28/2017 1516   K 3.9 03/28/2017 1516   CL 106 03/07/2017 1433   CO2 22 03/28/2017 1516   GLUCOSE 154 (H) 03/28/2017 1516   BUN 11.5 03/28/2017 1516   CREATININE 1.5 (H) 03/28/2017 1516   CALCIUM 9.4 03/28/2017 1516   PROT 7.2 03/28/2017 1516   ALBUMIN 3.8 03/28/2017 1516   AST 16 03/28/2017 1516   ALT 19 03/28/2017 1516   ALKPHOS 63 03/28/2017 1516   BILITOT 0.37 03/28/2017 1516    INo results found for: SPEP, UPEP  Lab Results  Component Value Date   WBC 5.6 03/28/2017   NEUTROABS 3.8 03/28/2017   HGB 13.5 03/28/2017   HCT 40.3 03/28/2017   MCV 84.4 03/28/2017   PLT 199 03/28/2017        Chemistry      Component Value Date/Time   NA 141 03/28/2017 1516   K 3.9 03/28/2017 1516   CL 106 03/07/2017 1433   CO2 22 03/28/2017 1516   BUN 11.5 03/28/2017 1516   CREATININE 1.5 (H) 03/28/2017 1516      Component Value Date/Time   CALCIUM 9.4 03/28/2017 1516   ALKPHOS 63 03/28/2017 1516   AST 16 03/28/2017 1516   ALT 19 03/28/2017 1516   BILITOT 0.37 03/28/2017 1516       No results found for: LABCA2  No components found for: LABCA125  No results for input(s): INR in the last 168 hours.  Urinalysis No results found for: COLORURINE, APPEARANCEUR, LABSPEC, PHURINE, GLUCOSEU, HGBUR, BILIRUBINUR, KETONESUR, PROTEINUR, UROBILINOGEN, NITRITE, LEUKOCYTESUR  STUDIES: Bilateral mammography at [facility] 10/13/2016 from the breast density to be category B. There was no evidence of malignancy.  ASSESSMENT: 61 y.o. [place], [place] status post central left breast biopsy 07/09/2015 for a clinical T1c N0, stage IA invasive ductal carcinoma, grade 1, estrogen and progesterone receptor strongly positive, HER-2 not amplified, with an MIB-1 of 10%   (1) status post left central mastectomy with nipple resection and excised sentinel lymph node sampling at [facility] 09/23/2015--for a pT1c pN0, stage IA invasive ductal carcinoma  (2) Oncotype DX Score of 11 predicts a 7% risk of recurrence outside the breast within 10 years if her only systemic therapy is tamoxifen for 5 years. It also predicts no benefit from chemotherapy.  (3) adjuvant radiation 12/22/15 - 02/05/16:  Left breast treated to 50.4 Gy in 28 fractions, Left breast boost treated to 10 Gy in 5 fractions   (4) tamoxifen started April 2017  PLAN: Nonie was concerned that tamoxifen might cause cancer of the uterus and wondered if we should switch to a different agent. He spent approximately 30 minutes going over the differences between tamoxifen and the aromatase inhibitor again. She understands that  anastrozole and the aromatase inhibitors in general work by blocking estrogen production. Accordingly vaginal dryness, decrease in bone density, and of course hot flashes can result. The aromatase inhibitors can also negatively affect the cholesterol profile, although that is a minor effect. One out of 5 women on aromatase inhibitors we will feel "old and achy". This arthralgia/myalgia syndrome, which resembles fibromyalgia clinically, does resolve with stopping the medications. Accordingly this is not a reason to not try an aromatase inhibitor but it is a frequent reason to stop it (in other words 20% of women will not be able to  tolerate these medications).  Tamoxifen on the other hand does not block estrogen production. It does not "take away a woman's estrogen". It blocks the estrogen receptor in breast cells. Like anastrozole, it can also cause hot flashes. As opposed to anastrozole, tamoxifen has many estrogen-like effects. It is technically an estrogen receptor modulator. This means that in some tissues tamoxifen works like estrogen-- for example it helps strengthen the bones. It tends to improve the cholesterol profile. It can cause thickening of the endometrial lining, and even endometrial polyps or rarely cancer of the uterus.(The risk of uterine cancer due to tamoxifen is one additional cancer per thousand women year). It can cause vaginal wetness or stickiness. It can cause blood clots through this estrogen-like effect--the risk of blood clots with tamoxifen is exactly the same as with birth control pills or hormone replacement.  Neither of these agents causes mood changes or weight gain, despite the popular belief that they can have these side effects. We have data from studies comparing either of these drugs with placebo, and in those cases the control group had the same amount of weight gain and depression as the group that took the drug.  I gave her all this information in writing. After this  discussion at present she wishes to continue on tamoxifen she'll have gone ahead and refill that prescription.  She will be due for repeat mammography in the early fall. I'm going to see her again in November and from that point I likely will start seeing her on a once a year basis.  She knows to call for any problems that may develop before the next visit here.      Chauncey Cruel, MD   03/28/2017 6:25 PM Medical Oncology and Hematology [facility] [address] [place], [place] [address] Tel. [phone]    Fax. [phone]

## 2017-08-11 ENCOUNTER — Ambulatory Visit (INDEPENDENT_AMBULATORY_CARE_PROVIDER_SITE_OTHER): Payer: BC Managed Care – PPO | Admitting: Internal Medicine

## 2017-08-11 ENCOUNTER — Encounter: Payer: Self-pay | Admitting: Internal Medicine

## 2017-08-11 VITALS — BP 140/88 | HR 65 | Temp 97.6°F | Ht 68.5 in | Wt 199.0 lb

## 2017-08-11 DIAGNOSIS — R319 Hematuria, unspecified: Secondary | ICD-10-CM

## 2017-08-11 DIAGNOSIS — Z23 Encounter for immunization: Secondary | ICD-10-CM | POA: Diagnosis not present

## 2017-08-11 LAB — POCT URINALYSIS DIPSTICK
BILIRUBIN UA: NEGATIVE
Glucose, UA: NEGATIVE
Ketones, UA: NEGATIVE
Leukocytes, UA: NEGATIVE
NITRITE UA: NEGATIVE
Protein, UA: NEGATIVE
RBC UA: NEGATIVE
Spec Grav, UA: >=1.03 — AB (ref 1.010–1.025)
Urobilinogen, UA: 0.2 E.U./dL
pH, UA: 6 (ref 5.0–8.0)

## 2017-08-11 NOTE — Assessment & Plan Note (Signed)
Asked to contact her gyn if this recurs as she may need pelvic ultrasound to evaluate her uterine lining. U/A today without hematuria. No signs of infection.

## 2017-08-11 NOTE — Addendum Note (Signed)
Addended by: Raford Pitcher R on: 08/11/2017 01:17 PM   Modules accepted: Orders

## 2017-08-11 NOTE — Progress Notes (Signed)
   Subjective:    Patient ID: Audrey Fischer, female    DOB: [birth year], 61 y.o.   MRN: [id number]  HPI The patient is a 61 YO female coming in for blood in urine and when she wiped. She is on tamoxifen and is concerned about uterus problems. She had a D/C in 2016. She denies fevers or chills. No burning or urgency. Denies abdomen or back pain. She does have hemorrhoids but is not sure if this is from that. This was about 2 weeks ago. Was 1 episode and has not been present since that time.   Review of Systems  Constitutional: Negative.   Respiratory: Negative.   Cardiovascular: Negative.   Gastrointestinal: Negative.   Genitourinary: Positive for hematuria. Negative for decreased urine volume, difficulty urinating, dysuria, enuresis, flank pain, frequency, vaginal bleeding, vaginal discharge and vaginal pain.  Musculoskeletal: Negative.       Objective:   Physical Exam  Constitutional: She is oriented to person, place, and time. She appears well-developed and well-nourished.  HENT:  Head: Normocephalic and atraumatic.  Eyes: EOM are normal.  Neck: Normal range of motion.  Cardiovascular: Normal rate and regular rhythm.   Pulmonary/Chest: Effort normal and breath sounds normal.  Abdominal: Soft. Bowel sounds are normal. She exhibits no distension. There is no tenderness. There is no rebound and no guarding.  No flank pain  Musculoskeletal: She exhibits no edema.  Neurological: She is alert and oriented to person, place, and time.   Vitals:   08/11/17 0806  BP: 140/88  Pulse: 65  Temp: 97.6 F (36.4 C)  TempSrc: Oral  SpO2: 98%  Weight: 199 lb (90.3 kg)  Height: 5' 8.5" (1.74 m)      Assessment & Plan:  Flu shot given at visit.

## 2017-09-09 ENCOUNTER — Other Ambulatory Visit: Payer: Self-pay

## 2017-09-09 MED ORDER — TAMOXIFEN CITRATE 20 MG PO TABS: ORAL_TABLET | ORAL | 12 refills | Status: DC

## 2017-09-12 ENCOUNTER — Other Ambulatory Visit: Payer: Self-pay

## 2017-09-12 MED ORDER — TAMOXIFEN CITRATE 20 MG PO TABS: ORAL_TABLET | ORAL | 12 refills | Status: DC

## 2017-09-28 ENCOUNTER — Other Ambulatory Visit: Payer: Self-pay

## 2017-09-28 DIAGNOSIS — C50812 Malignant neoplasm of overlapping sites of left female breast: Secondary | ICD-10-CM

## 2017-09-28 DIAGNOSIS — Z170 Estrogen receptor positive status [ER+]: Principal | ICD-10-CM

## 2017-09-29 ENCOUNTER — Ambulatory Visit: Payer: BC Managed Care – PPO | Admitting: Oncology

## 2017-09-29 ENCOUNTER — Other Ambulatory Visit: Payer: BC Managed Care – PPO

## 2017-10-19 LAB — HM MAMMOGRAPHY

## 2017-10-26 ENCOUNTER — Encounter: Payer: Self-pay | Admitting: Internal Medicine

## 2017-11-15 ENCOUNTER — Telehealth: Payer: Self-pay | Admitting: Oncology

## 2017-11-15 ENCOUNTER — Other Ambulatory Visit (HOSPITAL_BASED_OUTPATIENT_CLINIC_OR_DEPARTMENT_OTHER): Payer: BC Managed Care – PPO

## 2017-11-15 ENCOUNTER — Ambulatory Visit (HOSPITAL_BASED_OUTPATIENT_CLINIC_OR_DEPARTMENT_OTHER): Payer: BC Managed Care – PPO | Admitting: Oncology

## 2017-11-15 VITALS — BP 142/78 | HR 79 | Temp 97.7°F | Resp 17 | Ht 68.5 in | Wt 196.0 lb

## 2017-11-15 DIAGNOSIS — C50812 Malignant neoplasm of overlapping sites of left female breast: Secondary | ICD-10-CM

## 2017-11-15 DIAGNOSIS — Z170 Estrogen receptor positive status [ER+]: Secondary | ICD-10-CM

## 2017-11-15 DIAGNOSIS — Z79810 Long term (current) use of selective estrogen receptor modulators (SERMs): Secondary | ICD-10-CM | POA: Diagnosis not present

## 2017-11-15 DIAGNOSIS — C50512 Malignant neoplasm of lower-outer quadrant of left female breast: Secondary | ICD-10-CM

## 2017-11-15 LAB — CBC WITH DIFFERENTIAL/PLATELET
BASO%: 0.8 % (ref 0.0–2.0)
BASOS ABS: 0 10*3/uL (ref 0.0–0.1)
EOS ABS: 0.1 10*3/uL (ref 0.0–0.5)
EOS%: 2.7 % (ref 0.0–7.0)
HEMATOCRIT: 41 % (ref 34.8–46.6)
HEMOGLOBIN: 13.6 g/dL (ref 11.6–15.9)
LYMPH#: 1 10*3/uL (ref 0.9–3.3)
LYMPH%: 20.5 % (ref 14.0–49.7)
MCH: 27.9 pg (ref 25.1–34.0)
MCHC: 33.2 g/dL (ref 31.5–36.0)
MCV: 84.1 fL (ref 79.5–101.0)
MONO#: 0.3 10*3/uL (ref 0.1–0.9)
MONO%: 6.9 % (ref 0.0–14.0)
NEUT%: 69.1 % (ref 38.4–76.8)
NEUTROS ABS: 3.5 10*3/uL (ref 1.5–6.5)
Platelets: 197 10*3/uL (ref 145–400)
RBC: 4.88 10*6/uL (ref 3.70–5.45)
RDW: 13.3 % (ref 11.2–14.5)
WBC: 5.1 10*3/uL (ref 3.9–10.3)

## 2017-11-15 LAB — COMPREHENSIVE METABOLIC PANEL
ALT: 16 U/L (ref 0–55)
AST: 14 U/L (ref 5–34)
Albumin: 3.8 g/dL (ref 3.5–5.0)
Alkaline Phosphatase: 64 U/L (ref 40–150)
Anion Gap: 9 mEq/L (ref 3–11)
BUN: 16.1 mg/dL (ref 7.0–26.0)
CO2: 24 mEq/L (ref 22–29)
Calcium: 9.5 mg/dL (ref 8.4–10.4)
Chloride: 108 mEq/L (ref 98–109)
Creatinine: 1.4 mg/dL — ABNORMAL HIGH (ref 0.6–1.1)
EGFR: 47 mL/min/{1.73_m2} — ABNORMAL LOW (ref >60)
Glucose: 140 mg/dl (ref 70–140)
Potassium: 3.8 mEq/L (ref 3.5–5.1)
Sodium: 142 mEq/L (ref 136–145)
Total Bilirubin: 0.47 mg/dL (ref 0.20–1.20)
Total Protein: 7.6 g/dL (ref 6.4–8.3)

## 2017-11-15 MED ORDER — TAMOXIFEN CITRATE 20 MG PO TABS: ORAL_TABLET | ORAL | 12 refills | Status: DC

## 2017-11-15 NOTE — Telephone Encounter (Signed)
Gave patient AVs and calendar of upcoming December 2019 appointments.

## 2017-11-15 NOTE — Progress Notes (Signed)
[place]  [phone]) [phone] [phone]     ID: Audrey Fischer DOB: [birth year]  MR#: [id number]  JYN#:[id number]  Patient Care Team: Hoyt Koch, MD as PCP - General (Internal Medicine) Magrinat, Virgie Dad, MD as Consulting Physician (Oncology) Jovita Kussmaul, MD as Consulting Physician (General Surgery) Kyung Rudd, MD as Consulting Physician (Radiation Oncology) Leo Grosser Seymour Bars, MD as Consulting Physician (Obstetrics and Gynecology) Hilarie Fredrickson, Lajuan Lines, MD as Consulting Physician (Gastroenterology) Sylvan Cheese, NP as Nurse Practitioner (Hematology and Oncology) OTHER MD: Ronnette Hila MD  CHIEF COMPLAINT: estrogen receptor positive breast cancer  CURRENT TREATMENT: tamoxifen   BREAST CANCER HISTORY: From the original intake note:  Audrey Fischer tells me after her husband's death in 01-24-06 she neglected her own health and did not see a doctor for several years. More recently she had a menstrual period after thinking she was in menopause. This took her to Dr. Leo Grosser who is in the process of investigating the bleeding problems. She also updated Audrey Fischer's health maintenance with mammography at [place] obtained 07/02/2015. This showed a suspicious finding in the right breast and Audrey Fischer was referred to the breast Center where on 07/07/2015 she had bilateral diagnostic mammography with tomosynthesis and right breast ultrasonography. The breast density was category D.   In the left breast there was a small mass in the anterior aspect of the lower outer quadrant. By tomography this was irregular and spiculated. On physical exam it measured approximately 1.2 cm in the 7:00 position immediately adjacent to the nipple. Ultrasound found a 1.3 cm hypoechoic mass in the area in question. There were also 3 small oval hypoechoic masses containing echogenic material, the largest measuring 4 mm, with a thin internal septation. Ultrasound of the left axilla  was unremarkable.   biopsy of the left breast mass in question 07/09/2015 showed ([id number]) and invasive ductal carcinoma, grade 1, estrogen receptor 100% positive, progesterone receptor 100% positive, with an MIB-1 of 10%, and no HER-2 amplification, the signals ratio being 1.62 and the number per cell 2.10.  The patient's subsequent history is as detailed below  INTERVAL HISTORY: how on TAM  Audrey Fischer returns today for follow-up and treatment of her estrogen receptor positive breast cancer.  She continues on tamoxifen, with generally good tolerance.  She does have hot flashes, but has not wanted to take any medication for that.  The hot flashes occasionally wake her up at night.  Vaginal wetness is not an issue.  She obtains a drug on a good price   REVIEW OF SYSTEMS: She is on her feet all day, but does not otherwise exercise.  In general she says she is doing "great", and that family is doing well.  She denies any unusual complaints.  Detailed review of systems today was stable  PAST MEDICAL HISTORY: Past Medical History:  Diagnosis Date  . Breast cancer ([place]) 07/09/15   left breast,  lower-inner quadrant   . Coronary artery disease    cardiologist-  dr Donnetta Hutching [facility] regional physicians, [facility] cardiology in [facility])---  hx PCI w/ stents to OM and LAD 02-08-2008  . Hyperlipidemia   . Hypertension   . PMB (postmenopausal bleeding)   . S/P coronary artery stent placement     PAST SURGICAL HISTORY: Past Surgical History:  Procedure Laterality Date  . CESAREAN SECTION  10-09-1996  . CORONARY ANGIOPLASTY WITH STENT PLACEMENT  02-08-2008   dr Donnetta Hutching   PCI and stent to OM and LAD  .  DILATATION & CURRETTAGE/HYSTEROSCOPY WITH RESECTOCOPE N/A 08/15/2015   Procedure: DILATATION & CURETTAGE/HYSTEROSCOPY ;  Surgeon: Eldred Manges, MD;  Location: [place];  Service: Gynecology;  Laterality: N/A;  . DILATION AND CURETTAGE OF UTERUS  1994   blighted ovum    FAMILY  HISTORY Family History  Problem Relation Age of Onset  . Heart disease Mother   . Stroke Mother   . Colon cancer Mother 46  . Heart disease Father   The patient's father died at the age of 84 from a myocardial infarction. The patient's mother died at the age of 69, with a stroke. Both have significant diabetes problems. The patient has one brother, no sisters. On the mother's side 1 aunt had stomach cancer one cousin prostate cancer. The patient's own mother was diagnosed with colon cancer at age 92. There is no history of breast, ovarian, or uterine cancer in the family to the patient's knowledge.   GYNECOLOGIC HISTORY:  No LMP recorded. Patient is postmenopausal.  menarche age 54. First live birth age 61. The patient is GX P2. She never took hormone replacement. She did use oral contraceptives remotely, without complication  SOCIAL HISTORY:  Audrey Fischer teaches middle school math. Her husband died from infectious complications following back surgery in 2007. Her children are Joelle Roswell who is a Equities trader at [employer] in [place], and [name], and freshman at [employer] with an eye to studying physical therapy the patient attends [place]: Not in place. At the initial clinic visit  the patient was given the appropriate forms to complete and notarize at her discretion.   HEALTH MAINTENANCE: Social History   Tobacco Use  . Smoking status: Never Smoker  . Smokeless tobacco: Never Used  Substance Use Topics  . Alcohol use: Yes    Alcohol/week: 0.0 oz    Comment: occasionally  . Drug use: No     Colonoscopy:07/21/2015 / [facility]  PAP: UTD/ [facility]  Bone density:  Lipid panel:  Allergies  Allergen Reactions  . Capsule [Gelatin] Hives    Any medication that is in gel capsule form  . Capsaicin     Current Outpatient Medications  Medication Sig Dispense Refill  . aspirin 81 MG tablet  Take 81 mg by mouth daily.    Marland Kitchen atenolol (TENORMIN) 100 MG tablet Take 50 mg by mouth daily.  5  . Cholecalciferol (VITAMIN D3) 2000 UNITS TABS Take 1 tablet by mouth daily.    . rosuvastatin (CRESTOR) 10 MG tablet Take 10 mg by mouth every evening.     . tamoxifen (NOLVADEX) 20 MG tablet TAKE 1 TABLET (20 MG TOTAL) BY MOUTH DAILY. 30 tablet 12  . nitroGLYCERIN (NITROSTAT) 0.4 MG SL tablet Place 0.4 mg under the tongue. Reported on 01/30/2016     No current facility-administered medications for this visit.     OBJECTIVE: middle-aged African-American woman in no acute distress Vitals:   11/15/17 0902  BP: (!) 142/78  Pulse: 79  Resp: 17  Temp: 97.7 F (36.5 C)  SpO2: 99%     Body mass index is 29.37 kg/m.    ECOG FS:0 - Asymptomatic  Sclerae unicteric, pupils round and equal Oropharynx clear and moist No cervical or supraclavicular adenopathy Lungs no rales or rhonchi Heart regular rate and rhythm Abd soft, nontender, positive bowel sounds MSK no focal spinal tenderness, no upper extremity lymphedema Neuro: nonfocal, well oriented, appropriate affect,  though looks tired Breasts: The right breast is unremarkable.  The left breast is status post lumpectomy followed by radiation with no evidence of local recurrence.  Both axillae are benign.   LAB RESULTS:  CMP     Component Value Date/Time   NA 141 03/28/2017 1516   K 3.9 03/28/2017 1516   CL 106 03/07/2017 1433   CO2 22 03/28/2017 1516   GLUCOSE 154 (H) 03/28/2017 1516   BUN 11.5 03/28/2017 1516   CREATININE 1.5 (H) 03/28/2017 1516   CALCIUM 9.4 03/28/2017 1516   PROT 7.2 03/28/2017 1516   ALBUMIN 3.8 03/28/2017 1516   AST 16 03/28/2017 1516   ALT 19 03/28/2017 1516   ALKPHOS 63 03/28/2017 1516   BILITOT 0.37 03/28/2017 1516    INo results found for: SPEP, UPEP  Lab Results  Component Value Date   WBC 5.1 11/15/2017   NEUTROABS 3.5 11/15/2017   HGB 13.6 11/15/2017   HCT 41.0 11/15/2017   MCV 84.1 11/15/2017    PLT 197 11/15/2017      Chemistry      Component Value Date/Time   NA 141 03/28/2017 1516   K 3.9 03/28/2017 1516   CL 106 03/07/2017 1433   CO2 22 03/28/2017 1516   BUN 11.5 03/28/2017 1516   CREATININE 1.5 (H) 03/28/2017 1516      Component Value Date/Time   CALCIUM 9.4 03/28/2017 1516   ALKPHOS 63 03/28/2017 1516   AST 16 03/28/2017 1516   ALT 19 03/28/2017 1516   BILITOT 0.37 03/28/2017 1516       No results found for: LABCA2  No components found for: LABCA125  No results for input(s): INR in the last 168 hours.  Urinalysis    Component Value Date/Time   BILIRUBINUR Neg 08/11/2017 1315   PROTEINUR Neg 08/11/2017 1315   UROBILINOGEN 0.2 08/11/2017 1315   NITRITE Neg 08/11/2017 1315   LEUKOCYTESUR Negative 08/11/2017 1315    STUDIES: Bilateral diagnostic mammography with tomography at [facility] 10/19/2017 found the breast density to be category B.  There was no evidence of malignancy.  ASSESSMENT: 61 y.o. [place], [place] Woman status post central left breast biopsy 07/09/2015 for a clinical T1c N0, stage IA invasive ductal carcinoma, grade 1, estrogen and progesterone receptor strongly positive, HER-2 not amplified, with an MIB-1 of 10%   (1) status post left central mastectomy with nipple resection and excised sentinel lymph node sampling at [facility] 09/23/2015--for a pT1c pN0, stage IA invasive ductal carcinoma  (2) Oncotype DX Score of 11 predicts a 7% risk of recurrence outside the breast within 10 years if her only systemic therapy is tamoxifen for 5 years. It also predicts no benefit from chemotherapy.  (3) adjuvant radiation 12/22/15 - 02/05/16:  Left breast treated to 50.4 Gy in 28 fractions, Left breast boost treated to 10 Gy in 5 fractions   (4) tamoxifen started April 2017  PLAN: Audrey Fischer is now just over 2 years out from definitive surgery for her breast cancer with no evidence of disease recurrence.  This is very favorable.  We discussed  the fact that the first 2 years of the most dangerous and now that she is through that first hurdle she should give herself a little party.  She will consider that.  Her next party of course will be at 7 years, when she will "graduate".  She sees her primary physician Dr. Sharlet Salina in the spring and her gynecologist Dr. Leo Grosser in the fall.  Accordingly  I will continue to see her in December, after her November mammogram at [facility]  She knows to call for any problems that may develop before that visit.    Chauncey Cruel, MD   11/15/2017 9:15 AM Medical Oncology and Hematology [facility] [address] [place], [place] [address] Tel. [phone]    Fax. [phone]

## 2018-01-09 ENCOUNTER — Encounter: Payer: Self-pay | Admitting: Family Medicine

## 2018-01-09 ENCOUNTER — Ambulatory Visit: Payer: BC Managed Care – PPO | Admitting: Family Medicine

## 2018-01-09 VITALS — BP 158/80 | HR 90 | Temp 98.0°F | Ht 68.5 in | Wt 200.0 lb

## 2018-01-09 DIAGNOSIS — J069 Acute upper respiratory infection, unspecified: Secondary | ICD-10-CM | POA: Diagnosis not present

## 2018-01-09 DIAGNOSIS — B9789 Other viral agents as the cause of diseases classified elsewhere: Secondary | ICD-10-CM | POA: Diagnosis not present

## 2018-01-09 MED ORDER — HYDROCODONE-HOMATROPINE 5-1.5 MG/5ML PO SYRP: ORAL_SOLUTION | ORAL | 0 refills | Status: DC

## 2018-01-09 NOTE — Progress Notes (Signed)
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  She is a Radio producer  2 children 3 and 21 the oldest had the flu about 2 weeks ago. She has no flu type symptoms.  BP (!) 158/80 (BP Location: Left Arm, Patient Position: Sitting, Cuff Size: Large)   Pulse 90   Temp 98 F (36.7 C) (Oral)   Ht 5' 8.5" (1.74 m)   Wt 200 lb (90.7 kg)   SpO2 99%   BMI 29.97 kg/m  Well-developed well-nourished female no acute distress vital signs stable she's afebrile HEENT were negative neck was supple no adenopathy lungs are clear  #1 viral syndrome............ treat symptomatically as outlined.

## 2018-01-09 NOTE — Patient Instructions (Signed)
Drink lots of liquids  Netty pot,,,,,,,,,,, 1/8 of a teaspoon of salt and 1/8 of a teaspoon of baking soda in warm water,,,,,,,,, irrigate both nostrils at bedtime with a full pot,,,,,,,, then one shot of Afrin nasal spray,,,,,,,,, wait 10 minutes then one shot of steroid nasal spray  After 5 nights stop the Afrin but continue the saline irrigation and the steroid nasal spray until clear  Hydromet 1/2-1 teaspoon at bedtime when necessary for cough  Drink lots of liquids  Chloraseptic  Monitor your blood pressure daily......... after your well if your blood pressure continues to be elevated then consult your primary care physician for follow-up

## 2018-02-15 ENCOUNTER — Ambulatory Visit: Payer: BC Managed Care – PPO | Admitting: Internal Medicine

## 2018-02-20 ENCOUNTER — Ambulatory Visit: Payer: BC Managed Care – PPO | Admitting: Internal Medicine

## 2018-02-20 ENCOUNTER — Encounter: Payer: Self-pay | Admitting: Internal Medicine

## 2018-02-20 ENCOUNTER — Ambulatory Visit (INDEPENDENT_AMBULATORY_CARE_PROVIDER_SITE_OTHER)
Admission: RE | Admit: 2018-02-20 | Discharge: 2018-02-20 | Disposition: A | Discharge status: Home / Self Care | Payer: BC Managed Care – PPO | Source: Ambulatory Visit | Attending: Internal Medicine | Admitting: Internal Medicine

## 2018-02-20 VITALS — BP 160/100 | HR 88 | Temp 98.6°F | Ht 68.5 in | Wt 187.0 lb

## 2018-02-20 DIAGNOSIS — R05 Cough: Secondary | ICD-10-CM | POA: Diagnosis not present

## 2018-02-20 DIAGNOSIS — R059 Cough, unspecified: Secondary | ICD-10-CM

## 2018-02-20 MED ORDER — BENZONATATE 200 MG PO CAPS: 200.0000 mg | ORAL_CAPSULE | Freq: Three times a day (TID) | ORAL | 0 refills | Status: DC | PRN

## 2018-02-20 NOTE — Progress Notes (Signed)
   Subjective:    Patient ID: Audrey Fischer, female    DOB: [birth year], 62 y.o.   MRN: [id number]  HPI The patient is a 62 YO female coming in for cough going on for 1-2 months. She was seen about 1 month for it and given hycodan cough syrup which she used at night time. She could not take during the day as it made her drowsy. She is having some nose drainage still. Is not taking any cold or allergy medicine. She is overall improving gradually. She denies fevers or chills. She has non-productive cough mostly. Non-smoker. Denies SOB or activity change.   Review of Systems  Constitutional: Negative for activity change, appetite change, chills, fatigue, fever and unexpected weight change.  HENT: Positive for congestion and postnasal drip. Negative for ear discharge, ear pain, rhinorrhea, sinus pressure, sinus pain, sneezing, sore throat, tinnitus, trouble swallowing and voice change.   Eyes: Negative.   Respiratory: Positive for cough. Negative for chest tightness, shortness of breath and wheezing.   Cardiovascular: Negative.   Gastrointestinal: Negative.   Musculoskeletal: Negative.   Neurological: Negative.       Objective:   Physical Exam  Constitutional: She is oriented to person, place, and time. She appears well-developed and well-nourished.  HENT:  Head: Normocephalic and atraumatic.  Oropharynx with redness and clear drainage, nose with swollen turbinates, TMs normal bilaterally  Eyes: EOM are normal.  Neck: Normal range of motion. No thyromegaly present.  Cardiovascular: Normal rate and regular rhythm.  Pulmonary/Chest: Effort normal and breath sounds normal. No respiratory distress. She has no wheezes. She has no rales.  Abdominal: Soft.  Musculoskeletal: She exhibits no tenderness.  Lymphadenopathy:    She has no cervical adenopathy.  Neurological: She is alert and oriented to person, place, and time.  Skin: Skin is warm and dry.   Vitals:   02/20/18 1543  BP: (!) 160/100    Pulse: 88  Temp: 98.6 F (37 C)  TempSrc: Oral  SpO2: 98%  Weight: 187 lb (84.8 kg)  Height: 5' 8.5" (1.74 m)   CXR: done and independently reviewed by this MD without acute changes or pneumonia.     Assessment & Plan:

## 2018-02-20 NOTE — Patient Instructions (Signed)
We will check the chest x-ray today.   We have sent in tessalon perles to take up to 3 times per day for cough.  Start taking zyrtec or claritin to help the drainage.

## 2018-02-21 DIAGNOSIS — R05 Cough: Secondary | ICD-10-CM | POA: Insufficient documentation

## 2018-02-21 DIAGNOSIS — R059 Cough, unspecified: Secondary | ICD-10-CM | POA: Insufficient documentation

## 2018-02-21 NOTE — Assessment & Plan Note (Signed)
Suspect UACS from post nasal drip. Advised to start zyrtec and rx for tessalon perles. CXR done and read by this MD without acute changes so no antibiotics or steroids given today.

## 2018-04-04 ENCOUNTER — Ambulatory Visit: Payer: BC Managed Care – PPO | Admitting: Family Medicine

## 2018-04-04 ENCOUNTER — Encounter: Payer: Self-pay | Admitting: Family Medicine

## 2018-04-04 DIAGNOSIS — K1120 Sialoadenitis, unspecified: Secondary | ICD-10-CM | POA: Diagnosis not present

## 2018-04-04 MED ORDER — CEPHALEXIN 500 MG PO CAPS: 500.0000 mg | ORAL_CAPSULE | Freq: Four times a day (QID) | ORAL | 0 refills | Status: DC

## 2018-04-04 NOTE — Progress Notes (Signed)
Audrey Fischer - 62 y.o. female MRN [id number]  Date of birth: 08/20/1956  SUBJECTIVE:  Including CC & ROS.  Chief Complaint  Patient presents with  . Cough    Audrey Fischer is a 62 y.o. female that is presenting with a cough and facial swelling. Her cough has been ongoing intermittently for three months. She was seen in 02/20/18 for same symptoms. Denies shortness of breath. Denies wheezing. Denies chest pain. States she coughs only when she laughs. Denies fevers or body aches. Admits to productive cough. Denies being around anyone with similar symptoms. She feels like her cough has not improved.     Review of Systems  Constitutional: Positive for activity change and appetite change. Negative for fever.  HENT: Positive for facial swelling.   Cardiovascular: Negative for chest pain.  Gastrointestinal: Negative for abdominal pain.    HISTORY: Past Medical, Surgical, Social, and Family History Reviewed & Updated per EMR.   Pertinent Historical Findings include:  Past Medical History:  Diagnosis Date  . Breast cancer ([place]) 07/09/15   left breast,  lower-inner quadrant   . Coronary artery disease    cardiologist-  dr Donnetta Hutching [facility] regional physicians, [facility] cardiology in [facility])---  hx PCI w/ stents to OM and LAD 02-08-2008  . Hyperlipidemia   . Hypertension   . PMB (postmenopausal bleeding)   . S/P coronary artery stent placement     Past Surgical History:  Procedure Laterality Date  . CESAREAN SECTION  10-09-1996  . CORONARY ANGIOPLASTY WITH STENT PLACEMENT  02-08-2008   dr Donnetta Hutching   PCI and stent to OM and LAD  . DILATATION & CURRETTAGE/HYSTEROSCOPY WITH RESECTOCOPE N/A 08/15/2015   Procedure: DILATATION & CURETTAGE/HYSTEROSCOPY ;  Surgeon: Eldred Manges, MD;  Location: [place];  Service: Gynecology;  Laterality: N/A;  . DILATION AND CURETTAGE OF UTERUS  1994   blighted ovum    Allergies  Allergen Reactions  . Capsule [Gelatin] Hives    Any medication  that is in gel capsule form  . Capsaicin     Family History  Problem Relation Age of Onset  . Heart disease Mother   . Stroke Mother   . Colon cancer Mother 57  . Heart disease Father      Social History   Socioeconomic History  . Marital status: Single    Spouse name: Not on file  . Number of children: Not on file  . Years of education: Not on file  . Highest education level: Not on file  Occupational History  . Not on file  Social Needs  . Financial resource strain: Not on file  . Food insecurity:    Worry: Not on file    Inability: Not on file  . Transportation needs:    Medical: Not on file    Non-medical: Not on file  Tobacco Use  . Smoking status: Never Smoker  . Smokeless tobacco: Never Used  Substance and Sexual Activity  . Alcohol use: Yes    Alcohol/week: 0.0 oz    Comment: occasionally  . Drug use: No  . Sexual activity: Not on file  Lifestyle  . Physical activity:    Days per week: Not on file    Minutes per session: Not on file  . Stress: Not on file  Relationships  . Social connections:    Talks on phone: Not on file    Gets together: Not on file    Attends religious service: Not on file  Active member of club or organization: Not on file    Attends meetings of clubs or organizations: Not on file    Relationship status: Not on file  . Intimate partner violence:    Fear of current or ex partner: Not on file    Emotionally abused: Not on file    Physically abused: Not on file    Forced sexual activity: Not on file  Other Topics Concern  . Not on file  Social History Narrative  . Not on file     PHYSICAL EXAM:  VS: BP 131/78 (BP Location: Right Arm, Patient Position: Sitting, Cuff Size: Normal)   Pulse 75   Temp 99.5 F (37.5 C) (Oral)   Ht 5' 8.5" (1.74 m)   Wt 189 lb (85.7 kg)   SpO2 100%   BMI 28.32 kg/m  Physical Exam Gen: NAD, alert, cooperative with exam, well-appearing ENT: normal lips, normal nasal mucosa, right-sided  facial swelling of the submandibular gland, no discharge upon milking, no significant tenderness to palpation over this area, no overlying erythema Eye: normal EOM, normal conjunctiva and lids CV:  no edema, +2 pedal pulses, S1-S2, regular rate and rhythm Resp: no accessory muscle use, non-labored, clear to auscultation bilaterally, no crackles or wheezes Skin: no rashes, no areas of induration  Neuro: normal tone, normal sensation to touch Psych:  normal insight, alert and oriented MSK: normal gait, normal strength      ASSESSMENT & PLAN:   Sialoadenitis Enlargement of the right submandibular gland.  No fevers but having fatigue and malaise. -Keflex -Counseled on supportive care -Advised to follow-up if no improvement and seek immediate care if symptoms worsen.

## 2018-04-04 NOTE — Patient Instructions (Signed)
Please take the antibiotic  Please follow up if your symptoms haven't improved or they have worsened   Salivary Gland Infection A salivary gland infection is an infection in one or more of the glands that produce spit (saliva). You have six major salivary glands. Each gland has a duct that carries saliva into your mouth. Saliva keeps your mouth moist and breaks down the food that you eat. It also helps to prevent tooth decay. Two salivary glands are located just in front of your ears (parotid). The ducts for these glands open up inside your cheeks, near your back teeth. You also have two glands under your tongue (sublingual) and two glands under your jaw (submandibular). The ducts for these glands open under your tongue. Any salivary gland can become infected. Most infections occur in the parotid glands or submandibular glands. What are the causes? Salivary glands can be infected by viruses or bacteria.  The mumps virus is the most common cause of viral salivary gland infections, though mumps is now rare in many areas because of vaccination. ? This infection causes swelling in both parotid glands. ? Viral infections are more common in children.  The bacteria that cause salivary gland infections are usually the same bacteria that normally live in your mouth. ? A stone can form in a salivary gland and block the flow of saliva. As a result, saliva backs up into the salivary gland. Bacteria may then start to grow behind the blockage and cause infection. ? Bacterial infections usually cause pain and swelling on one side of the face. Submandibular gland swelling occurs under the jaw. Parotid swelling occurs in front of the ear. ? Bacterial infections are more common in adults.  What increases the risk? Children who do not get the MMR (measles, mumps, rubella) vaccine are more likely to get mumps, which can cause a viral salivary gland infection. Risk factors for bacterial infections include:  Poor  dental care (oral hygiene).  Smoking.  Not drinking enough water.  Having a disease that causes dry mouth and dry eyes (Mikulicz syndrome or Sjogren syndrome).  What are the signs or symptoms? The main sign of salivary gland infection is a swollen salivary gland. This type of inflammation is often called sialadenitis. You may have swelling in front of your ear, under your jaw, or under your tongue. Swelling may get worse when you eat and decrease after you eat. Other signs and symptoms include:  Pain.  Tenderness.  Redness.  Dry mouth.  Bad taste in your mouth.  Difficulty chewing and swallowing.  Fever.  How is this diagnosed? Your health care provider may suspect a salivary gland infection based on your signs and symptoms. He or she will also do a physical exam. The health care provider will look and feel inside your mouth to see whether a stone is blocking a salivary gland duct. You may need to see an ear, nose, and throat specialist (ENT or otolaryngologist) for diagnosis and treatment. You may also need to have diagnostic tests, such as:  An X-ray to check for a stone.  Other imaging studies to look for an abscess and to rule out other causes of swelling. These tests may include: ? Ultrasound. ? CT scan. ? MRI.  Culture and sensitivity test. This involves collecting a sample of pus for testing in the lab to see what bacteria grow and what antibiotics they are sensitive to. The testing sample may be: ? Swabbed from a salivary gland duct. ? Withdrawn from   a swollen gland with a needle (aspiration).  How is this treated? Viral salivary gland infections usually clear up without treatment. Bacterial infections are usually treated with antibiotic medicine. Severe infections that cause difficulty with swallowing may be treated with an IV antibiotic in the hospital. Other treatments may include:  Probing and widening the salivary duct to allow a stone to pass. In some cases, a  thin, flexible scope (endoscope) may be inserted into the duct to find a stone and remove it.  Breaking up a stone using sound waves.  Draining an infected gland (abscess) with a needle.  In some cases, you may need surgery so your health care provider can: ? Remove a stone. ? Drain pus from an abscess. ? Remove a badly infected gland.  Follow these instructions at home:  Take medicines only as directed by your health care provider.  If you were prescribed an antibiotic medicine, finish it all even if you start to feel better.  Follow these instructions every few hours: ? Suck on a lemon candy to stimulate the flow of saliva. ? Put a warm compress over the gland. ? Gently massage the gland.  Drink enough fluid to keep your urine clear or pale yellow.  Rinse your mouth with a mixture of warm water and salt every few hours. To make this mixture, add a pinch of salt to 1 cup of warm water.  Practice good oral hygiene by brushing and flossing your teeth after meals and before you go to bed.  Do not use any tobacco products, including cigarettes, chewing tobacco, or electronic cigarettes. If you need help quitting, ask your health care provider. Contact a health care provider if:  You have pain and swelling in your face, jaw, or mouth after eating.  You have persistent swelling in any of these places: ? In front of your ear. ? Under your jaw. ? Inside your mouth. Get help right away if:  You have pain and swelling in your face, jaw, or mouth that are getting worse.  Your pain and swelling make it hard to swallow or breathe. This information is not intended to replace advice given to you by your health care provider. Make sure you discuss any questions you have with your health care provider. Document Released: 12/23/2004 Document Revised: 04/22/2016 Document Reviewed: 04/17/2014 Elsevier Interactive Patient Education  2018 [employer].

## 2018-04-04 NOTE — Assessment & Plan Note (Signed)
Enlargement of the right submandibular gland.  No fevers but having fatigue and malaise. -Keflex -Counseled on supportive care -Advised to follow-up if no improvement and seek immediate care if symptoms worsen.

## 2018-11-13 NOTE — Progress Notes (Signed)
Audrey Fischer  [phone] [phone]     ID: Audrey Fischer DOB: 1956/06/20  MR#: [id number]  KTG#:[id number]  Patient Care Team: Hoyt Koch, MD as PCP - General (Internal Medicine) Magrinat, Virgie Dad, MD as Consulting Physician (Oncology) Jovita Kussmaul, MD as Consulting Physician (General Surgery) Kyung Rudd, MD as Consulting Physician (Radiation Oncology) Leo Grosser Seymour Bars, MD as Consulting Physician (Obstetrics and Gynecology) Hilarie Fredrickson, Lajuan Lines, MD as Consulting Physician (Gastroenterology) Sylvan Cheese, NP as Nurse Practitioner (Hematology and Oncology) OTHER MD: Ronnette Hila MD  CHIEF COMPLAINT: estrogen receptor positive breast cancer  CURRENT TREATMENT: Anastrozole   BREAST CANCER HISTORY: From the original intake note:  Audrey Fischer tells me after her husband's death in February 22, 2006 she neglected her own health and did not see a doctor for several years. More recently she had a menstrual period after thinking she was in menopause. This took her to Dr. Leo Grosser who is in the process of investigating the bleeding problems. She also updated Audrey Fischer's health maintenance with mammography at [facility] obtained 07/02/2015. This showed a suspicious finding in the right breast and [name] was referred to the breast Center where on 07/07/2015 she had bilateral diagnostic mammography with tomosynthesis and right breast ultrasonography. The breast density was category D.   In the left breast there was a small mass in the anterior aspect of the lower outer quadrant. By tomography this was irregular and spiculated. On physical exam it measured approximately 1.2 cm in the 7:00 position immediately adjacent to the nipple. Ultrasound found a 1.3 cm hypoechoic mass in the area in question. There were also 3 small oval hypoechoic masses containing echogenic material, the largest measuring 4 mm, with a thin internal septation. Ultrasound of the left axilla  was unremarkable.   biopsy of the left breast mass in question 07/09/2015 showed ([id number]) and invasive ductal carcinoma, grade 1, estrogen receptor 100% positive, progesterone receptor 100% positive, with an MIB-1 of 10%, and no HER-2 amplification, the signals ratio being 1.62 and the number per cell 2.10.  The patient's subsequent history is as detailed below  INTERVAL HISTORY: how on TAM Laasia returns today for follow-up of her estrogen receptor positive breast cancer.   The patient continues on tamoxifen, which she is tolerating well. She has hot flashes, which can vary in intensity.  She is also experiencing dry eye.  She is concerned that tamoxifen may cause her permanent eye damage and is interested in switching to an aromatase inhibitor  Since her last visit here, she underwent a chest X-ray on 02/20/2018 showing no pneumonia nor CHF. Mild interstitial prominence may be normal for the patient or could reflect acute bronchitic change.   Her last mammography was on 10/30/2018 at [facility], showing Breast Density Category B. There are no suspicious masses or calcifications. There is no mammographic evidence of malignancy.    REVIEW OF SYSTEMS: Audrey Fischer is doing well overall. For exercise, she has been trying to walk 5 miles per day. She is also on her feet all day for her job, and reaches about 10,000 steps each day. The patient denies unusual headaches, visual changes, nausea, vomiting, or dizziness. There has been no unusual cough, phlegm production, or pleurisy. This been no change in bowel or bladder habits. The patient denies unexplained fatigue or unexplained weight loss, bleeding, rash, or fever. A detailed review of systems was otherwise noncontributory.    PAST MEDICAL HISTORY: Past Medical History:  Diagnosis Date  .  Breast cancer ([place]) 07/09/15   left breast,  lower-inner quadrant   . Coronary artery disease    cardiologist-  dr Donnetta Hutching [facility],  [facility] in [facility])---  hx PCI w/ stents to OM and LAD 02-08-2008  . Hyperlipidemia   . Hypertension   . PMB (postmenopausal bleeding)   . S/P coronary artery stent placement     PAST SURGICAL HISTORY: Past Surgical History:  Procedure Laterality Date  . CESAREAN SECTION  10-09-1996  . CORONARY ANGIOPLASTY WITH STENT PLACEMENT  02-08-2008   dr Donnetta Hutching   PCI and stent to OM and LAD  . DILATATION & CURRETTAGE/HYSTEROSCOPY WITH RESECTOCOPE N/A 08/15/2015   Procedure: DILATATION & CURETTAGE/HYSTEROSCOPY ;  Surgeon: Eldred Manges, MD;  Location: [facility];  Service: Gynecology;  Laterality: N/A;  . DILATION AND CURETTAGE OF UTERUS  1994   blighted ovum    FAMILY HISTORY Family History  Problem Relation Age of Onset  . Heart disease Mother   . Stroke Mother   . Colon cancer Mother 72  . Heart disease Father   The patient's father died at the age of 65 from a myocardial infarction. The patient's mother died at the age of 13, with a stroke. Both have significant diabetes problems. The patient has one brother, no sisters. On the mother's side 1 aunt had stomach cancer one cousin prostate cancer. The patient's own mother was diagnosed with colon cancer at age 67. There is no history of breast, ovarian, or uterine cancer in the family to the patient's knowledge.   GYNECOLOGIC HISTORY:  No LMP recorded. Patient is postmenopausal.  menarche age 38. First live birth age 62. The patient is GX P2. She never took hormone replacement. She did use oral contraceptives remotely, without complication  SOCIAL HISTORY:  Audrey Fischer teaches middle school math. Her husband died from infectious complications following back surgery in 2007. Her children are Audrey Fischer who is a Equities trader at [employer] in [place], and Audrey Fischer, and freshman at [employer] with an eye to studying physical therapy the patient attends [place]: Not in place. At the initial clinic visit  the patient was given the appropriate forms to complete and notarize at her discretion.   HEALTH MAINTENANCE: Social History   Tobacco Use  . Smoking status: Never Smoker  . Smokeless tobacco: Never Used  Substance Use Topics  . Alcohol use: Yes    Alcohol/week: 0.0 standard drinks    Comment: occasionally  . Drug use: No     Colonoscopy:07/21/2015 / [facility]  PAP: UTD/ [facility]  Bone density:  Lipid panel:  Allergies  Allergen Reactions  . Capsule [Gelatin] Hives    Any medication that is in gel capsule form  . Capsaicin     Current Outpatient Medications  Medication Sig Dispense Refill  . anastrozole (ARIMIDEX) 1 MG tablet Take 1 tablet (1 mg total) by mouth daily. 90 tablet 4  . aspirin 81 MG tablet Take 81 mg by mouth daily.    Marland Kitchen atenolol (TENORMIN) 100 MG tablet Take 50 mg by mouth daily.  5  . Cholecalciferol (VITAMIN D3) 2000 UNITS TABS Take 1 tablet by mouth daily.    . nitroGLYCERIN (NITROSTAT) 0.4 MG SL tablet Place 0.4 mg under the tongue. Reported on 01/30/2016    . rosuvastatin (CRESTOR) 10 MG tablet Take 10 mg by mouth every evening.  No current facility-administered medications for this visit.     OBJECTIVE: middle-aged African-American woman who appears well Vitals:   11/14/18 0916  BP: (!) 155/91  Pulse: 64  Resp: 18  Temp: 97.7 F (36.5 C)  SpO2: 99%     Body mass index is 30.33 kg/m.    ECOG FS:0 - Asymptomatic  Sclerae unicteric, EOMs intact Oropharynx clear and moist No cervical or supraclavicular adenopathy Lungs no rales or rhonchi Heart regular rate and rhythm Abd soft, nontender, positive bowel sounds MSK no focal spinal tenderness, no upper extremity lymphedema Neuro: nonfocal, well oriented, appropriate affect Breasts: Right breast is benign.  The left breast has undergone lumpectomy and radiation.  The nipple complex is missing.  There is no  evidence of local recurrence.  Both axillae are benign  LAB RESULTS:  CMP     Component Value Date/Time   NA 142 11/15/2017 0840   K 3.8 11/15/2017 0840   CL 106 03/07/2017 1433   CO2 24 11/15/2017 0840   GLUCOSE 140 11/15/2017 0840   BUN 16.1 11/15/2017 0840   CREATININE 1.4 (H) 11/15/2017 0840   CALCIUM 9.5 11/15/2017 0840   PROT 7.6 11/15/2017 0840   ALBUMIN 3.8 11/15/2017 0840   AST 14 11/15/2017 0840   ALT 16 11/15/2017 0840   ALKPHOS 64 11/15/2017 0840   BILITOT 0.47 11/15/2017 0840    INo results found for: SPEP, UPEP  Lab Results  Component Value Date   WBC 5.0 11/14/2018   NEUTROABS 3.3 11/14/2018   HGB 13.1 11/14/2018   HCT 39.1 11/14/2018   MCV 84.3 11/14/2018   PLT 183 11/14/2018      Chemistry      Component Value Date/Time   NA 142 11/15/2017 0840   K 3.8 11/15/2017 0840   CL 106 03/07/2017 1433   CO2 24 11/15/2017 0840   BUN 16.1 11/15/2017 0840   CREATININE 1.4 (H) 11/15/2017 0840      Component Value Date/Time   CALCIUM 9.5 11/15/2017 0840   ALKPHOS 64 11/15/2017 0840   AST 14 11/15/2017 0840   ALT 16 11/15/2017 0840   BILITOT 0.47 11/15/2017 0840       No results found for: LABCA2  No components found for: LABCA125  No results for input(s): INR in the last 168 hours.  Urinalysis    Component Value Date/Time   BILIRUBINUR Neg 08/11/2017 1315   PROTEINUR Neg 08/11/2017 1315   UROBILINOGEN 0.2 08/11/2017 1315   NITRITE Neg 08/11/2017 1315   LEUKOCYTESUR Negative 08/11/2017 1315    STUDIES: She will be due for repeat mammography at [facility] in December 2020  ASSESSMENT: 62 y.o. [place], [place] Woman status post central left breast biopsy 07/09/2015 for a clinical T1c N0, stage IA invasive ductal carcinoma, grade 1, estrogen and progesterone receptor strongly positive, HER-2 not amplified, with an MIB-1 of 10%   (1) status post left central mastectomy with nipple resection and excised sentinel lymph node sampling at [facility] 09/23/2015--for a pT1c pN0, stage IA invasive ductal carcinoma  (2) Oncotype DX Score of 11 predicts a 7% risk of recurrence outside the breast within 10 years if her only systemic therapy is tamoxifen for 5 years. It also predicts no benefit from chemotherapy.  (3) adjuvant radiation 12/22/15 - 02/05/16:  Left breast treated to 50.4 Gy in 28 fractions, Left breast boost treated to 10 Gy in 5 fractions   (4) tamoxifen started April 2017, discontinued December 2019  (5) anastrozole  started January 2020   PLAN: Gayleen is now a little over 3 years out from definitive surgery for breast cancer with no evidence of disease recurrence.  This is very favorable.  She is tolerating tamoxifen moderately well but is very concerned about eye issues.  She is interested in switching to anastrozole.  Today we discussed the possible toxicity side effects and complications of this agent.  We do have an [place] obtained in 2017 which is greater than 60 indicating that she is indeed postmenopausal  Accordingly she will go off tamoxifen at this point and start anastrozole November 29, 2018.  She will see me again late Franchon 2020.  She will then follow-up with survivorship at [facility] in December 2020 and have her mammography there  The plan is to continue antiestrogens for a total of 5 years which will take [place] through April 2023.  She knows to call for any other issues that may develop before the next visit.  I spent approximately 30 minutes face to face with Mikayla with more than 50% of that time spent in counseling and coordination of care.      Magrinat, Virgie Dad, MD  11/14/18 9:42 AM Medical Oncology and Hematology [facility] [address] [place], [place] [address] Tel. [phone]    Fax. [phone]   I, Jacqualyn Posey am acting as a Education administrator for Chauncey Cruel, MD.   I, Lurline Del MD, have reviewed the above documentation for accuracy and completeness, and I agree  with the above.

## 2018-11-14 ENCOUNTER — Inpatient Hospital Stay: Payer: BC Managed Care – PPO | Admitting: Oncology

## 2018-11-14 ENCOUNTER — Telehealth: Payer: Self-pay | Admitting: Oncology

## 2018-11-14 ENCOUNTER — Inpatient Hospital Stay: Payer: BC Managed Care – PPO | Attending: Oncology

## 2018-11-14 VITALS — BP 155/91 | HR 64 | Temp 97.7°F | Resp 18 | Ht 68.0 in | Wt 199.5 lb

## 2018-11-14 DIAGNOSIS — Z170 Estrogen receptor positive status [ER+]: Secondary | ICD-10-CM | POA: Insufficient documentation

## 2018-11-14 DIAGNOSIS — Z79899 Other long term (current) drug therapy: Secondary | ICD-10-CM | POA: Insufficient documentation

## 2018-11-14 DIAGNOSIS — Z79810 Long term (current) use of selective estrogen receptor modulators (SERMs): Secondary | ICD-10-CM | POA: Diagnosis not present

## 2018-11-14 DIAGNOSIS — C50512 Malignant neoplasm of lower-outer quadrant of left female breast: Secondary | ICD-10-CM | POA: Insufficient documentation

## 2018-11-14 DIAGNOSIS — I10 Essential (primary) hypertension: Secondary | ICD-10-CM

## 2018-11-14 DIAGNOSIS — C50812 Malignant neoplasm of overlapping sites of left female breast: Secondary | ICD-10-CM

## 2018-11-14 DIAGNOSIS — Z9012 Acquired absence of left breast and nipple: Secondary | ICD-10-CM

## 2018-11-14 DIAGNOSIS — Z7982 Long term (current) use of aspirin: Secondary | ICD-10-CM | POA: Insufficient documentation

## 2018-11-14 LAB — COMPREHENSIVE METABOLIC PANEL
ALBUMIN: 3.7 g/dL (ref 3.5–5.0)
ALK PHOS: 61 U/L (ref 38–126)
ALT: 15 U/L (ref 0–44)
ANION GAP: 11 (ref 5–15)
AST: 11 U/L — ABNORMAL LOW (ref 15–41)
BUN: 13 mg/dL (ref 6–20)
CALCIUM: 9.2 mg/dL (ref 8.9–10.3)
CO2: 22 mmol/L (ref 22–32)
CREATININE: 1.43 mg/dL — AB (ref 0.44–1.00)
Chloride: 109 mmol/L (ref 98–111)
GFR calc Af Amer: 46 mL/min — ABNORMAL LOW (ref >60)
GFR calc non Af Amer: 40 mL/min — ABNORMAL LOW (ref >60)
GLUCOSE: 204 mg/dL — AB (ref 70–99)
Potassium: 3.9 mmol/L (ref 3.5–5.1)
Sodium: 142 mmol/L (ref 135–145)
Total Bilirubin: 0.2 mg/dL — ABNORMAL LOW (ref 0.3–1.2)
Total Protein: 7.2 g/dL (ref 6.5–8.1)

## 2018-11-14 LAB — CBC WITH DIFFERENTIAL/PLATELET
Abs Immature Granulocytes: 0.01 10*3/uL (ref 0.00–0.07)
BASOS PCT: 1 %
Basophils Absolute: 0 10*3/uL (ref 0.0–0.1)
EOS ABS: 0.1 10*3/uL (ref 0.0–0.5)
EOS PCT: 2 %
HEMATOCRIT: 39.1 % (ref 36.0–46.0)
Hemoglobin: 13.1 g/dL (ref 12.0–15.0)
Immature Granulocytes: 0 %
LYMPHS ABS: 1.2 10*3/uL (ref 0.7–4.0)
Lymphocytes Relative: 24 %
MCH: 28.2 pg (ref 26.0–34.0)
MCHC: 33.5 g/dL (ref 30.0–36.0)
MCV: 84.3 fL (ref 80.0–100.0)
MONO ABS: 0.4 10*3/uL (ref 0.1–1.0)
MONOS PCT: 7 %
NEUTROS PCT: 66 %
Neutro Abs: 3.3 10*3/uL (ref 1.7–7.7)
PLATELETS: 183 10*3/uL (ref 150–400)
RBC: 4.64 MIL/uL (ref 3.87–5.11)
RDW: 12.5 % (ref 11.5–15.5)
WBC: 5 10*3/uL (ref 4.0–10.5)
nRBC: 0 % (ref 0.0–0.2)

## 2018-11-14 MED ORDER — ANASTROZOLE 1 MG PO TABS: 1.0000 mg | ORAL_TABLET | Freq: Every day | ORAL | 4 refills | Status: DC

## 2018-11-14 NOTE — Telephone Encounter (Signed)
Gave avs and calendar ° °

## 2018-12-08 ENCOUNTER — Telehealth: Payer: Self-pay | Admitting: *Deleted

## 2018-12-08 NOTE — Telephone Encounter (Signed)
VM left by pt late this afternoon stating " dizziness since starting the new medication on 11/29/2018" " I didn't take it today and need to be advised on what to do next "   Return call number left as [phone].  This RN returned call and obtained identified VM- message left to continue off the arimidex ( noted per MD dictation pt stopped tamoxifen and started arimidex on 11/29/2018) and requested a return call on Monday 12/11/2018.

## 2018-12-15 ENCOUNTER — Telehealth: Payer: Self-pay | Admitting: *Deleted

## 2018-12-15 NOTE — Telephone Encounter (Signed)
Audrey Fischer states she forgot to call back regarding what to take since she had the reaction to arimidex. Notified her that Dr Jana Hakim will be back in the office on Wednesday. She plans to call back on Thursday. Please advise.

## 2018-12-21 ENCOUNTER — Telehealth: Payer: Self-pay | Admitting: *Deleted

## 2018-12-21 NOTE — Telephone Encounter (Signed)
See prior notes per issues of dizziness since stopping the tamoxifen and starting arimidex.  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  Per above discussion- pt will implement claritin daily as well as have meclizine on hand ( can obtain over the counter ). She will hydrate well.  If symptoms persist or other changes she will call.  Other wise plan is to follow up next week - and if symptoms are gone - discuss with MD resuming the tamoxifen or other recommendations.

## 2018-12-29 ENCOUNTER — Encounter: Payer: Self-pay | Admitting: *Deleted

## 2018-12-29 ENCOUNTER — Telehealth: Payer: Self-pay

## 2018-12-29 NOTE — Telephone Encounter (Signed)
Copied from [place] [id number]. Topic: Referral - Request for Referral >> Dec 29, 2018  3:34 PM Windy Kalata wrote: Has patient seen PCP for this complaint? no *If NO, is insurance requiring patient see PCP for this issue before PCP can refer them? Referral for which specialty: Cardiologist Preferred provider/office: Dr Hilton Sinclair [facility]  Reason for referral: Has cardiovascular disease and past provider retired Best call back is [phone] >> Dec 29, 2018  4:28 PM Para Skeans A wrote: Appointment on 01/02/19

## 2018-12-29 NOTE — Telephone Encounter (Signed)
This encounter was created in error - please disregard.

## 2019-01-02 ENCOUNTER — Ambulatory Visit: Payer: BC Managed Care – PPO | Admitting: Internal Medicine

## 2019-01-02 ENCOUNTER — Other Ambulatory Visit (INDEPENDENT_AMBULATORY_CARE_PROVIDER_SITE_OTHER): Payer: BC Managed Care – PPO

## 2019-01-02 ENCOUNTER — Encounter: Payer: Self-pay | Admitting: Internal Medicine

## 2019-01-02 VITALS — BP 138/90 | HR 84 | Temp 97.7°F | Ht 68.0 in | Wt 204.0 lb

## 2019-01-02 DIAGNOSIS — R42 Dizziness and giddiness: Secondary | ICD-10-CM

## 2019-01-02 DIAGNOSIS — I2583 Coronary atherosclerosis due to lipid rich plaque: Secondary | ICD-10-CM

## 2019-01-02 DIAGNOSIS — Z170 Estrogen receptor positive status [ER+]: Secondary | ICD-10-CM

## 2019-01-02 DIAGNOSIS — I2510 Atherosclerotic heart disease of native coronary artery without angina pectoris: Secondary | ICD-10-CM | POA: Diagnosis not present

## 2019-01-02 DIAGNOSIS — C50812 Malignant neoplasm of overlapping sites of left female breast: Secondary | ICD-10-CM | POA: Diagnosis not present

## 2019-01-02 LAB — COMPREHENSIVE METABOLIC PANEL
ALBUMIN: 4.3 g/dL (ref 3.5–5.2)
ALT: 17 U/L (ref 0–35)
AST: 12 U/L (ref 0–37)
Alkaline Phosphatase: 65 U/L (ref 39–117)
BILIRUBIN TOTAL: 0.5 mg/dL (ref 0.2–1.2)
BUN: 13 mg/dL (ref 6–23)
CO2: 26 mEq/L (ref 19–32)
Calcium: 9.8 mg/dL (ref 8.4–10.5)
Chloride: 105 mEq/L (ref 96–112)
Creatinine, Ser: 1.35 mg/dL — ABNORMAL HIGH (ref 0.40–1.20)
GFR: 48.35 mL/min — ABNORMAL LOW (ref >60.00)
Glucose, Bld: 156 mg/dL — ABNORMAL HIGH (ref 70–99)
Potassium: 3.7 mEq/L (ref 3.5–5.1)
Sodium: 141 mEq/L (ref 135–145)
TOTAL PROTEIN: 7.4 g/dL (ref 6.0–8.3)

## 2019-01-02 LAB — CBC
HCT: 41.2 % (ref 36.0–46.0)
Hemoglobin: 14 g/dL (ref 12.0–15.0)
MCHC: 34 g/dL (ref 30.0–36.0)
MCV: 83.1 fl (ref 78.0–100.0)
Platelets: 193 10*3/uL (ref 150.0–400.0)
RBC: 4.96 Mil/uL (ref 3.87–5.11)
RDW: 14.1 % (ref 11.5–15.5)
WBC: 6.3 10*3/uL (ref 4.0–10.5)

## 2019-01-02 LAB — TSH: TSH: 1.55 u[IU]/mL (ref 0.35–4.50)

## 2019-01-02 LAB — VITAMIN D 25 HYDROXY (VIT D DEFICIENCY, FRACTURES): VITD: 23.69 ng/mL — ABNORMAL LOW (ref 30.00–100.00)

## 2019-01-02 LAB — VITAMIN B12: Vitamin B-12: 140 pg/mL — ABNORMAL LOW (ref 211–911)

## 2019-01-02 NOTE — Telephone Encounter (Signed)
Due for physical can discuss then

## 2019-01-02 NOTE — Patient Instructions (Signed)
We will check the blood work today and call you back with the results.

## 2019-01-02 NOTE — Telephone Encounter (Signed)
Can you please schedule patient for a physical to discuss. Thank you

## 2019-01-02 NOTE — Telephone Encounter (Signed)
This was discussed with the patient during her visit today.

## 2019-01-02 NOTE — Progress Notes (Signed)
   Subjective:   Patient ID: Audrey Fischer, female    DOB: 18-May-1956, 63 y.o.   MRN: [id number]  HPI The patient is a 63 YO female coming in for several concerns including CAD (wanting to see cardiology, past stenting and her most recent cardiologist in [place] has left practice since she saw them last year, she wants to return to care in [place] and needs referral, denies chest pains currently, taking medications as prescribed), and dizziness (she is having dizziness since she stopped tamoxifen and changed to arimidex, she took this for about 1 week and then stopped due to dizziness, she has had some improvement in the dizziness since that time, it has been about 2-3 weeks since stopping, feels kind of like passing out and kind of like room spinning, she describes several different ways, denies other symptoms, no new headaches or chest pains, no new stomach problems like constipation or diarrhea or blood in stool, no vaginal bleeding)   Review of Systems  Constitutional: Negative.   HENT: Negative.   Eyes: Negative.   Respiratory: Negative for cough, chest tightness and shortness of breath.   Cardiovascular: Negative for chest pain, palpitations and leg swelling.  Gastrointestinal: Negative for abdominal distention, abdominal pain, constipation, diarrhea, nausea and vomiting.  Musculoskeletal: Negative.   Skin: Negative.   Neurological: Positive for dizziness and light-headedness.  Psychiatric/Behavioral: Negative.     Objective:  Physical Exam Constitutional:      Appearance: She is well-developed.  HENT:     Head: Normocephalic and atraumatic.  Neck:     Musculoskeletal: Normal range of motion.  Cardiovascular:     Rate and Rhythm: Normal rate and regular rhythm.  Pulmonary:     Effort: Pulmonary effort is normal. No respiratory distress.     Breath sounds: Normal breath sounds. No wheezing or rales.  Abdominal:     General: Bowel sounds are normal. There is no distension.       Palpations: Abdomen is soft.     Tenderness: There is no abdominal tenderness. There is no rebound.  Skin:    General: Skin is warm and dry.  Neurological:     Mental Status: She is alert and oriented to person, place, and time.     Coordination: Coordination normal.     Vitals:   01/02/19 0820 01/02/19 0902  BP: (!) 140/100 138/90  Pulse: 84   Temp: 97.7 F (36.5 C)   TempSrc: Oral   SpO2: 98%   Weight: 204 lb (92.5 kg)   Height: 5\' 8"  (1.727 m)     Assessment & Plan:

## 2019-01-03 ENCOUNTER — Ambulatory Visit (INDEPENDENT_AMBULATORY_CARE_PROVIDER_SITE_OTHER): Payer: BC Managed Care – PPO

## 2019-01-03 DIAGNOSIS — E538 Deficiency of other specified B group vitamins: Secondary | ICD-10-CM | POA: Diagnosis not present

## 2019-01-03 DIAGNOSIS — R42 Dizziness and giddiness: Secondary | ICD-10-CM | POA: Insufficient documentation

## 2019-01-03 DIAGNOSIS — I2510 Atherosclerotic heart disease of native coronary artery without angina pectoris: Secondary | ICD-10-CM | POA: Insufficient documentation

## 2019-01-03 DIAGNOSIS — I2583 Coronary atherosclerosis due to lipid rich plaque: Secondary | ICD-10-CM

## 2019-01-03 MED ORDER — CYANOCOBALAMIN 1000 MCG/ML IJ SOLN: 1000.0000 ug | Freq: Once | INTRAMUSCULAR | Status: DC

## 2019-01-03 MED ORDER — CYANOCOBALAMIN 1000 MCG/ML IJ SOLN
1000.0000 ug | Freq: Once | INTRAMUSCULAR | Status: AC
  Administered 2019-01-03: 1000 ug via INTRAMUSCULAR

## 2019-01-03 NOTE — Assessment & Plan Note (Signed)
She is currently off all her anti-hormonal therapy. She has had possible (although not likely) side effect from arimidex and was concerned about side effect long term from tamoxifen. I have advised that she see her oncologist to talk about these risks and decide on best treatment option for her now.

## 2019-01-03 NOTE — Assessment & Plan Note (Addendum)
Concern for underlying cause. Checking thyroid, vitamin D and B12, CBC, CMP. Adjust based on results. It is unclear that this is medication related as she has been off medication for almost 3 weeks and only took a week or less. Her HR is normal here. We did change to atenolol for dosing convenience at last visit but that was well before symptoms started. Orthostatic vital signs were done and were normal.

## 2019-01-03 NOTE — Progress Notes (Signed)
I have reviewed and agree.

## 2019-01-03 NOTE — Assessment & Plan Note (Signed)
Referral to cardiology. Do not feel symptoms are related to cardiac with her dizziness.

## 2019-01-09 NOTE — Progress Notes (Signed)
Patient ID: Audrey Fischer, female   DOB: [birth year], 63 y.o.   MRN: [id number] Medical treatment/procedure(s) were performed by non-physician practitioner and as supervising physician I was immediately available for consultation/collaboration. I agree with above. Hoyt Koch, MD

## 2019-01-17 ENCOUNTER — Ambulatory Visit (INDEPENDENT_AMBULATORY_CARE_PROVIDER_SITE_OTHER): Payer: BC Managed Care – PPO

## 2019-01-17 DIAGNOSIS — E538 Deficiency of other specified B group vitamins: Secondary | ICD-10-CM

## 2019-01-17 MED ORDER — CYANOCOBALAMIN 1000 MCG/ML IJ SOLN
1000.0000 ug | Freq: Once | INTRAMUSCULAR | Status: AC
  Administered 2019-01-17: 1000 ug via INTRAMUSCULAR

## 2019-01-30 ENCOUNTER — Encounter: Payer: Self-pay | Admitting: Internal Medicine

## 2019-01-30 NOTE — Progress Notes (Signed)
error 

## 2019-01-31 ENCOUNTER — Ambulatory Visit: Payer: BC Managed Care – PPO

## 2019-02-01 ENCOUNTER — Ambulatory Visit (INDEPENDENT_AMBULATORY_CARE_PROVIDER_SITE_OTHER): Payer: BC Managed Care – PPO

## 2019-02-01 DIAGNOSIS — E538 Deficiency of other specified B group vitamins: Secondary | ICD-10-CM | POA: Diagnosis not present

## 2019-02-01 MED ORDER — CYANOCOBALAMIN 1000 MCG/ML IJ SOLN
1000.0000 ug | Freq: Once | INTRAMUSCULAR | Status: AC
  Administered 2019-02-01: 1000 ug via INTRAMUSCULAR

## 2019-02-01 NOTE — Progress Notes (Signed)
Medical treatment/procedure(s) were performed by non-physician practitioner and as supervising physician I was immediately available for consultation/collaboration. I agree with above. Elizabeth A Crawford, MD  

## 2019-02-12 ENCOUNTER — Telehealth: Payer: Self-pay | Admitting: Internal Medicine

## 2019-02-12 NOTE — Telephone Encounter (Signed)
Copied from [place] [phone]. Topic: Appointment Scheduling - Scheduling Inquiry for Clinic >> Feb 12, 2019 11:40 AM Nils Flack wrote: Reason for CRM: pt is considered high risk for corona.  She has been told that she needs a pneumonia shot.  She is asking if she can get that when she comes in for her b12 shot on 02/14/19 Please call [phone]

## 2019-02-12 NOTE — Telephone Encounter (Signed)
Patient informed and stated understanding.

## 2019-02-12 NOTE — Telephone Encounter (Signed)
Patient states she is a heart care patient and that she had heard the CDC was recommending her age group with heart issues to get the pneumonia injection.  Patient wants to know if she can get the pneumonia injection when she comes in to get her B12 injection but states that both injections would need to go into one arm due to being a breast cancer survivor.   Patient is requesting order.

## 2019-02-12 NOTE — Telephone Encounter (Signed)
Can get 23 pneumonia with visit for B12

## 2019-02-13 ENCOUNTER — Encounter: Payer: Self-pay | Admitting: Internal Medicine

## 2019-02-14 ENCOUNTER — Ambulatory Visit (INDEPENDENT_AMBULATORY_CARE_PROVIDER_SITE_OTHER): Payer: BC Managed Care – PPO

## 2019-02-14 ENCOUNTER — Other Ambulatory Visit: Payer: Self-pay

## 2019-02-14 DIAGNOSIS — E538 Deficiency of other specified B group vitamins: Secondary | ICD-10-CM | POA: Diagnosis not present

## 2019-02-14 DIAGNOSIS — Z299 Encounter for prophylactic measures, unspecified: Secondary | ICD-10-CM

## 2019-02-14 DIAGNOSIS — Z23 Encounter for immunization: Secondary | ICD-10-CM

## 2019-02-14 MED ORDER — CYANOCOBALAMIN 1000 MCG/ML IJ SOLN
1000.0000 ug | Freq: Once | INTRAMUSCULAR | Status: AC
  Administered 2019-02-14: 1000 ug via INTRAMUSCULAR

## 2019-02-15 NOTE — Progress Notes (Signed)
Medical treatment/procedure(s) were performed by non-physician practitioner and as supervising physician I was immediately available for consultation/collaboration. I agree with above. Amoni Morales A Knowledge Escandon, MD  

## 2019-05-10 ENCOUNTER — Telehealth: Payer: Self-pay | Admitting: Internal Medicine

## 2019-05-10 NOTE — Telephone Encounter (Signed)
Mychart pending, smartphone, verbal consent, pre reg complete 05/10/19 AF

## 2019-05-11 ENCOUNTER — Telehealth: Payer: Self-pay | Admitting: *Deleted

## 2019-05-11 NOTE — Telephone Encounter (Signed)
   TELEPHONE CALL NOTE  This patient has been deemed a candidate for follow-up tele-health visit to limit community exposure during the Covid-19 pandemic. I spoke with the patient via phone to discuss instructions   A Virtual Office Visit appointment type has been scheduled for 6/18  with [facility], with "VIDEO/TEXT     Raiford Simmonds, RN 05/11/2019 12:17 PM

## 2019-05-17 ENCOUNTER — Telehealth (INDEPENDENT_AMBULATORY_CARE_PROVIDER_SITE_OTHER): Payer: BC Managed Care – PPO | Admitting: Internal Medicine

## 2019-05-17 ENCOUNTER — Telehealth: Payer: Self-pay | Admitting: *Deleted

## 2019-05-17 ENCOUNTER — Encounter: Payer: Self-pay | Admitting: Internal Medicine

## 2019-05-17 VITALS — Ht 68.0 in | Wt 190.0 lb

## 2019-05-17 DIAGNOSIS — E785 Hyperlipidemia, unspecified: Secondary | ICD-10-CM

## 2019-05-17 DIAGNOSIS — I214 Non-ST elevation (NSTEMI) myocardial infarction: Secondary | ICD-10-CM

## 2019-05-17 DIAGNOSIS — I10 Essential (primary) hypertension: Secondary | ICD-10-CM

## 2019-05-17 DIAGNOSIS — Z9861 Coronary angioplasty status: Secondary | ICD-10-CM

## 2019-05-17 DIAGNOSIS — I2510 Atherosclerotic heart disease of native coronary artery without angina pectoris: Secondary | ICD-10-CM | POA: Diagnosis not present

## 2019-05-17 NOTE — Patient Instructions (Addendum)
   Medication Instructions:   NO CHANGES   If you need a refill on your cardiac medications before your next appointment, please call your pharmacy.   Lab work:  Lab ( LIPID)  will be done in [place] OFFICE VISIT WITH DR Margaretann Loveless WE WILL MAIL YOU LABSLIP CLOSER TO THAT TIME  Testing/Procedures:  Not needed  Follow-Up: At [facility], you and your health needs are our priority.  As part of our continuing mission to provide you with exceptional heart care, we have created designated Provider Care Teams.  These Care Teams include your primary Cardiologist (physician) and Advanced Practice Providers (APPs -  Physician Assistants and Nurse Practitioners) who all work together to provide you with the care you need, when you need it. . You will need a follow up appointment in  6 months Dec 2020.  Please call our office 2 months in advance to schedule this appointment.  You may see Elouise Munroe, MD*or one of the following Advanced Practice Providers on your designated Care Team:   . Rosaria Ferries, PA-C . Jory Sims, DNP, ANP  Any Other Special Instructions Will Be Listed Below (If Applicable).

## 2019-05-17 NOTE — Progress Notes (Signed)
Virtual Visit via Video Note   This visit type was conducted due to national recommendations for restrictions regarding the COVID-19 Pandemic (e.g. social distancing) in an effort to limit this patient's exposure and mitigate transmission in our community.  Due to her co-morbid illnesses, this patient is at least at moderate risk for complications without adequate follow up.  This format is felt to be most appropriate for this patient at this time.  All issues noted in this document were discussed and addressed.  A limited physical exam was performed with this format.  Please refer to the patient's chart for her consent to telehealth for [facility].   Date:  05/17/2019   ID:  Audrey Fischer, DOB 14-Nov-1956, MRN [id number]  Patient Location: Home Provider Location: Office  PCP:  Hoyt Koch, MD  Cardiologist:  Elouise Munroe, MD  Electrophysiologist:  None   Evaluation Performed:  New Patient Evaluation  Chief Complaint:  Establish care, history of LAD stent  History of Present Illness:    Audrey Fischer is a 63 y.o. female with a history of NSTEMI in March 2009 with LAD and OM stents, HTN, HLD, and breast cancer. She presents today after her previous cardiologist retired to establish care with [employer].   She overall feels well, she recently started B12 supplementation and feels that it improved her symptoms of fatigue. She walks 4 miles, several times a week, and denies chest pain, chest pressure, dyspnea at rest or with exertion, palpitations. Denies PND, orthopnea, or leg swelling. Denies syncope or presyncope. Denies dizziness or lightheadedness. Denies snoring and has not been evaluated for sleep apnea.  She takes rosuvastatin 10 mg daily, and previous took Lipitor 40 mg but had muscle aches on this dose.   The patient does not have symptoms concerning for COVID-19 infection (fever, chills, cough, or new shortness of breath).    Past Medical History:  Diagnosis  Date  . Breast cancer ([place]) 07/09/15   left breast,  lower-inner quadrant   . Coronary artery disease    cardiologist-  dr Donnetta Hutching [facility] regional physicians, [facility] cardiology in [facility])---  hx PCI w/ stents to OM and LAD 02-08-2008  . Hyperlipidemia   . Hypertension   . PMB (postmenopausal bleeding)   . S/P coronary artery stent placement    Past Surgical History:  Procedure Laterality Date  . CESAREAN SECTION  10-09-1996  . CORONARY ANGIOPLASTY WITH STENT PLACEMENT  02-08-2008   dr Donnetta Hutching   PCI and stent to OM and LAD  . DILATATION & CURRETTAGE/HYSTEROSCOPY WITH RESECTOCOPE N/A 08/15/2015   Procedure: DILATATION & CURETTAGE/HYSTEROSCOPY ;  Surgeon: Eldred Manges, MD;  Location: [place];  Service: Gynecology;  Laterality: N/A;  . DILATION AND CURETTAGE OF UTERUS  1994   blighted ovum     Current Meds  Medication Sig  . aspirin 81 MG tablet Take 81 mg by mouth daily.  Marland Kitchen atenolol (TENORMIN) 100 MG tablet Take 50 mg by mouth daily.  . Cholecalciferol (VITAMIN D3) 2000 UNITS TABS Take 1 tablet by mouth daily.  . nitroGLYCERIN (NITROSTAT) 0.4 MG SL tablet Place 0.4 mg under the tongue. Reported on 01/30/2016  . rosuvastatin (CRESTOR) 10 MG tablet Take 10 mg by mouth every evening.   . [DISCONTINUED] anastrozole (ARIMIDEX) 1 MG tablet Take 1 tablet (1 mg total) by mouth daily.     Allergies:   Capsule [gelatin] and Capsaicin   Social History   Tobacco Use  .  Smoking status: Never Smoker  . Smokeless tobacco: Never Used  Substance Use Topics  . Alcohol use: Yes    Alcohol/week: 0.0 standard drinks    Comment: occasionally  . Drug use: No     Family Hx: The patient's family history includes Colon cancer (age of onset: 34) in her mother; Heart disease in her father and mother; Stroke in her mother.  ROS:   Please see the history of present illness.     All other systems reviewed and are negative.   Prior CV studies:   The following studies were  reviewed today:    Labs/Other Tests and Data Reviewed:    EKG:  No ECG reviewed.  Recent Labs: 01/02/2019: TSH 1.55 05/21/2019: ALT 36; BUN 11; Creatinine, Ser 1.60; Hemoglobin 13.8; Platelets 206; Potassium 4.1; Sodium 140   Recent Lipid Panel Lab Results  Component Value Date/Time   CHOL 178 03/07/2017 02:33 PM   TRIG 160.0 (H) 03/07/2017 02:33 PM   HDL 44.20 03/07/2017 02:33 PM   CHOLHDL 4 03/07/2017 02:33 PM   LDLCALC 102 (H) 03/07/2017 02:33 PM    Wt Readings from Last 3 Encounters:  05/21/19 207 lb 6.4 oz (94.1 kg)  05/17/19 190 lb (86.2 kg)  01/02/19 204 lb (92.5 kg)     Objective:    Vital Signs:  Ht 5\' 8"  (1.727 m)   Wt 190 lb (86.2 kg)   BMI 28.89 kg/m    VITAL SIGNS:  reviewed GEN:  no acute distress EYES:  sclerae anicteric, EOMI - Extraocular Movements Intact RESPIRATORY:  normal respiratory effort, symmetric expansion CARDIOVASCULAR:  no peripheral edema SKIN:  no rash, lesions or ulcers. MUSCULOSKELETAL:  no obvious deformities. NEURO:  alert and oriented x 3, no obvious focal deficit PSYCH:  normal affect  ASSESSMENT & PLAN:    1. Coronary artery disease involving native coronary artery of native heart without angina pectoris   2. Hyperlipidemia, unspecified hyperlipidemia type   3. History of percutaneous coronary intervention   4. Essential hypertension   5. Non-ST elevation (NSTEMI) myocardial infarction [facility])    CAD - stable, no chest pain. Continue ASA, rosuvastatin. Patient feels stable on atenolol, no change required at this time but discussed indications to transition over time.   HLD - continue rosuvastatin. Lipids not at goal, will reassess a lipid panel and determine if additional therapy is needed given myalgias with higher doses of statin.   HTN - controlled at last check, continue atenolol   COVID-19 Education: The signs and symptoms of COVID-19 were discussed with the patient and how to seek care for testing (follow up with PCP  or arrange E-visit).  The importance of social distancing was discussed today.  Time:   Today, I have spent 30 minutes with the patient with telehealth technology discussing the above problems.     Medication Adjustments/Labs and Tests Ordered: Current medicines are reviewed at length with the patient today.  Concerns regarding medicines are outlined above.   Tests Ordered: Orders Placed This Encounter  Procedures  . Lipid panel    Medication Changes: No orders of the defined types were placed in this encounter.   Follow Up:  Virtual Visit or In Person in 6 month(s)  Signed, Elouise Munroe, MD  05/17/2019 9:45 PM    [facility]

## 2019-05-20 NOTE — Progress Notes (Signed)
[place]  [phone] [phone]     ID: Audrey Fischer DOB: Mar 03, 1956  MR#: [id number]  OFV#:[id number]  Patient Care Team: Hoyt Koch, MD as PCP - General (Internal Medicine) Elouise Munroe, MD as PCP - Cardiology (Cardiology) Linah Klapper, Virgie Dad, MD as Consulting Physician (Oncology) Jovita Kussmaul, MD as Consulting Physician (General Surgery) Kyung Rudd, MD as Consulting Physician (Radiation Oncology) Leo Grosser Seymour Bars, MD as Consulting Physician (Obstetrics and Gynecology) Hilarie Fredrickson, Lajuan Lines, MD as Consulting Physician (Gastroenterology) Sylvan Cheese, NP as Nurse Practitioner (Hematology and Oncology) OTHER MD: Ronnette Hila MD  CHIEF COMPLAINT: estrogen receptor positive breast cancer  CURRENT TREATMENT: Anastrozole   BREAST CANCER HISTORY: From the original intake note:  Audrey Fischer tells me after her husband's death in 2006-02-03 she neglected her own health and did not see a doctor for several years. More recently she had a menstrual period after thinking she was in menopause. This took her to Dr. Leo Grosser who is in the process of investigating the bleeding problems. She also updated Audrey Fischer's health maintenance with mammography at [facility] obtained 07/02/2015. This showed a suspicious finding in the right breast and Audrey Fischer was referred to the breast Center where on 07/07/2015 she had bilateral diagnostic mammography with tomosynthesis and right breast ultrasonography. The breast density was category D.   In the left breast there was a small mass in the anterior aspect of the lower outer quadrant. By tomography this was irregular and spiculated. On physical exam it measured approximately 1.2 cm in the 7:00 position immediately adjacent to the nipple. Ultrasound found a 1.3 cm hypoechoic mass in the area in question. There were also 3 small oval hypoechoic masses containing echogenic material, the largest measuring 4 mm, with  a thin internal septation. Ultrasound of the left axilla was unremarkable.   biopsy of the left breast mass in question 07/09/2015 showed ([id number]) and invasive ductal carcinoma, grade 1, estrogen receptor 100% positive, progesterone receptor 100% positive, with an MIB-1 of 10%, and no HER-2 amplification, the signals ratio being 1.62 and the number per cell 2.10.  The patient's subsequent history is as detailed below   INTERVAL HISTORY:  Audrey Fischer returns today for follow-up and treatment of her estrogen receptor positive breast cancer.   She continues on anastrozole. She has had some hot flashes, but they are manageable. She also has some vaginal dryness, but it is also manageable at the moment.    Since her last visit here, she underwent diagnostic bilateral mammography with tomography on 10/30/2018 showing: Breast Density Category B. There is No mammographic evidence of malignancy.   She has never had a bone density.   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 She does the shopping in th house, but she takes appropriate precautions. She was nervous to get out an walk because she lives very close to [facility], which had an outbreak. For exercise, she notes that she was very active before the pandemic, reaching around 10,000 steps per day. Now, she may get between 4 and 5,000 steps per day. She notes some dizziness that was caused from B-12 deficiency. Her PCP gave her some B-12 shots and she is now taking over-the-counter supplementation. The patient denies unusual headaches, visual changes, nausea, vomiting, or dizziness. There has been no unusual cough, phlegm production, or pleurisy. This been no change in bowel or bladder habits. The patient denies unexplained fatigue or unexplained weight loss,  bleeding, rash, or fever. A detailed review of systems was otherwise noncontributory.    PAST MEDICAL HISTORY: Past Medical History:  Diagnosis  Date   Breast cancer ([place]) 07/09/15   left breast,  lower-inner quadrant    Coronary artery disease    cardiologist-  dr Donnetta Hutching [facility] regional physicians, [facility] cardiology in [facility])---  hx PCI w/ stents to OM and LAD 02-08-2008   Hyperlipidemia    Hypertension    PMB (postmenopausal bleeding)    S/P coronary artery stent placement     PAST SURGICAL HISTORY: Past Surgical History:  Procedure Laterality Date   CESAREAN SECTION  10-09-1996   CORONARY ANGIOPLASTY WITH STENT PLACEMENT  02-08-2008   dr Donnetta Hutching   PCI and stent to OM and LAD   DILATATION & CURRETTAGE/HYSTEROSCOPY WITH RESECTOCOPE N/A 08/15/2015   Procedure: DILATATION & CURETTAGE/HYSTEROSCOPY ;  Surgeon: Eldred Manges, MD;  Location: [facility];  Service: Gynecology;  Laterality: N/A;   DILATION AND CURETTAGE OF UTERUS  1994   blighted ovum    FAMILY HISTORY Family History  Problem Relation Age of Onset   Heart disease Mother    Stroke Mother    Colon cancer Mother 39   Heart disease Father    The patient's father died at the age of 45 from a myocardial infarction. The patient's mother died at the age of 24, with a stroke. Both have significant diabetes problems. The patient has one brother, no sisters. On the mother's side 1 aunt had stomach cancer one cousin prostate cancer. The patient's own mother was diagnosed with colon cancer at age 24. There is no history of breast, ovarian, or uterine cancer in the family to the patient's knowledge.    GYNECOLOGIC HISTORY:  No LMP recorded. Patient is postmenopausal.  menarche age 14. First live birth age 16. The patient is GX P2. She never took hormone replacement. She did use oral contraceptives remotely, without complication   SOCIAL HISTORY: (Updated Audrey Fischer 2020). Audrey Fischer teaches middle school math. Her husband died from infectious complications following back surgery in 2007. Her children are Audrey Fischer who working to start up a TV  station in [place] and is hoping to become a Theme park manager, and Audrey Fischer, a graduate from [employer] who now works at [employer] in [place]. The patient attends [employer]   ADVANCED DIRECTIVES: Not in place. At the initial clinic visit  the patient was given the appropriate forms to complete and notarize at her discretion.   HEALTH MAINTENANCE: Social History   Tobacco Use   Smoking status: Never Smoker   Smokeless tobacco: Never Used  Substance Use Topics   Alcohol use: Yes    Alcohol/week: 0.0 standard drinks    Comment: occasionally   Drug use: No     Colonoscopy:07/21/2015 / [facility]  PAP: UTD/ [facility]  Bone density:  Lipid panel:  Allergies  Allergen Reactions   Capsule [Gelatin] Hives    Any medication that is in gel capsule form   Capsaicin     Current Outpatient Medications  Medication Sig Dispense Refill   anastrozole (ARIMIDEX) 1 MG tablet Take 1 tablet (1 mg total) by mouth daily. 90 tablet 4   aspirin 81 MG tablet Take 81 mg by mouth daily.     atenolol (TENORMIN) 100 MG tablet Take 50 mg by mouth daily.  5   Cholecalciferol (VITAMIN D3) 2000 UNITS TABS Take 1 tablet by mouth daily.  nitroGLYCERIN (NITROSTAT) 0.4 MG SL tablet Place 0.4 mg under the tongue. Reported on 01/30/2016     rosuvastatin (CRESTOR) 10 MG tablet Take 10 mg by mouth every evening.      vitamin B-12 (CYANOCOBALAMIN) 500 MCG tablet Take 1 tablet (500 mcg total) by mouth daily.     No current facility-administered medications for this visit.     OBJECTIVE: middle-aged African-American woman in no acute distress Vitals:   05/21/19 1008  BP: (!) 161/100  Pulse: 66  Resp: 18  Temp: 97.8 F (36.6 C)  SpO2: 100%     Body mass index is 31.54 kg/m.    ECOG FS:1 - Symptomatic but completely ambulatory  Sclerae unicteric, pupils round and equal No cervical or supraclavicular adenopathy Lungs no rales or rhonchi Heart regular rate  and rhythm Abd soft, nontender, positive bowel sounds MSK no focal spinal tenderness, no upper extremity lymphedema Neuro: nonfocal, well oriented, appropriate affect Breasts: The right breast is unremarkable.  The left breast is status post central lumpectomy and radiation.  There is minimal hyperpigmentation.  The cosmetic result is generally good, with good symmetry.  There is no evidence of disease recurrence.  Both axillae are benign.  LAB RESULTS:  CMP     Component Value Date/Time   NA 141 01/02/2019 0905   NA 142 11/15/2017 0840   K 3.7 01/02/2019 0905   K 3.8 11/15/2017 0840   CL 105 01/02/2019 0905   CO2 26 01/02/2019 0905   CO2 24 11/15/2017 0840   GLUCOSE 156 (H) 01/02/2019 0905   GLUCOSE 140 11/15/2017 0840   BUN 13 01/02/2019 0905   BUN 16.1 11/15/2017 0840   CREATININE 1.35 (H) 01/02/2019 0905   CREATININE 1.4 (H) 11/15/2017 0840   CALCIUM 9.8 01/02/2019 0905   CALCIUM 9.5 11/15/2017 0840   PROT 7.4 01/02/2019 0905   PROT 7.6 11/15/2017 0840   ALBUMIN 4.3 01/02/2019 0905   ALBUMIN 3.8 11/15/2017 0840   AST 12 01/02/2019 0905   AST 14 11/15/2017 0840   ALT 17 01/02/2019 0905   ALT 16 11/15/2017 0840   ALKPHOS 65 01/02/2019 0905   ALKPHOS 64 11/15/2017 0840   BILITOT 0.5 01/02/2019 0905   BILITOT 0.47 11/15/2017 0840   GFRNONAA 40 (L) 11/14/2018 0859   GFRAA 46 (L) 11/14/2018 0859    INo results found for: SPEP, UPEP  Lab Results  Component Value Date   WBC 6.7 05/21/2019   NEUTROABS 4.2 05/21/2019   HGB 13.8 05/21/2019   HCT 40.4 05/21/2019   MCV 83.0 05/21/2019   PLT 206 05/21/2019      Chemistry      Component Value Date/Time   NA 141 01/02/2019 0905   NA 142 11/15/2017 0840   K 3.7 01/02/2019 0905   K 3.8 11/15/2017 0840   CL 105 01/02/2019 0905   CO2 26 01/02/2019 0905   CO2 24 11/15/2017 0840   BUN 13 01/02/2019 0905   BUN 16.1 11/15/2017 0840   CREATININE 1.35 (H) 01/02/2019 0905   CREATININE 1.4 (H) 11/15/2017 0840        Component Value Date/Time   CALCIUM 9.8 01/02/2019 0905   CALCIUM 9.5 11/15/2017 0840   ALKPHOS 65 01/02/2019 0905   ALKPHOS 64 11/15/2017 0840   AST 12 01/02/2019 0905   AST 14 11/15/2017 0840   ALT 17 01/02/2019 0905   ALT 16 11/15/2017 0840   BILITOT 0.5 01/02/2019 0905   BILITOT 0.47 11/15/2017 0840  No results found for: LABCA2  No components found for: [id number]  No results for input(s): INR in the last 168 hours.  Urinalysis    Component Value Date/Time   BILIRUBINUR Neg 08/11/2017 1315   PROTEINUR Neg 08/11/2017 1315   UROBILINOGEN 0.2 08/11/2017 1315   NITRITE Neg 08/11/2017 1315   LEUKOCYTESUR Negative 08/11/2017 1315    STUDIES: No results found.   ASSESSMENT: 63 y.o. [place], [place] Woman status post central left breast biopsy 07/09/2015 for a clinical T1c N0, stage IA invasive ductal carcinoma, grade 1, estrogen and progesterone receptor strongly positive, HER-2 not amplified, with an MIB-1 of 10%   (1) status post left central mastectomy with nipple resection and excised sentinel lymph node sampling at [facility] 09/23/2015--for a pT1c pN0, stage IA invasive ductal carcinoma  (2) Oncotype DX Score of 11 predicts a 7% risk of recurrence outside the breast within 10 years if her only systemic therapy is tamoxifen for 5 years. It also predicts no benefit from chemotherapy.  (3) adjuvant radiation 12/22/15 - 02/05/16:  Left breast treated to 50.4 Gy in 28 fractions, Left breast boost treated to 10 Gy in 5 fractions   (4) tamoxifen started April 2017, discontinued December 2019  (5) anastrozole started January 2020  (a) bone density December 2020 at [place]: Audrey Fischer is now 3-1/2 years out from definitive surgery for her breast cancer with no evidence of disease recurrence.  This is very favorable.  She is tolerating anastrozole generally well and the plan will be to continue that until March 2022.  She has not had a bone density.  We  have placed the order for that to be done with her next mammogram at [facility] which will be in December  She is keeping appropriate COVID precautions  She had an excellent exercise program prior to the pandemic but now she is a bit behind and we discussed that in detail  She knows to call for any other issues that may develop before the next visit.     Audrey Fischer, Virgie Dad, MD  05/21/19 10:34 AM Medical Oncology and Hematology [facility] [address] [place], [place] [address] Tel. [phone]    Fax. [phone]  I, Jacqualyn Posey am acting as a Education administrator for Chauncey Cruel, MD.   I, Lurline Del MD, have reviewed the above documentation for accuracy and completeness, and I agree with the above.

## 2019-05-21 ENCOUNTER — Other Ambulatory Visit: Payer: Self-pay

## 2019-05-21 ENCOUNTER — Inpatient Hospital Stay (HOSPITAL_BASED_OUTPATIENT_CLINIC_OR_DEPARTMENT_OTHER): Payer: BC Managed Care – PPO | Admitting: Oncology

## 2019-05-21 ENCOUNTER — Inpatient Hospital Stay: Payer: BC Managed Care – PPO | Attending: Oncology

## 2019-05-21 VITALS — BP 161/100 | HR 66 | Temp 97.8°F | Resp 18 | Ht 68.0 in | Wt 207.4 lb

## 2019-05-21 DIAGNOSIS — Z79811 Long term (current) use of aromatase inhibitors: Secondary | ICD-10-CM | POA: Insufficient documentation

## 2019-05-21 DIAGNOSIS — C50812 Malignant neoplasm of overlapping sites of left female breast: Secondary | ICD-10-CM

## 2019-05-21 DIAGNOSIS — C50512 Malignant neoplasm of lower-outer quadrant of left female breast: Secondary | ICD-10-CM | POA: Insufficient documentation

## 2019-05-21 DIAGNOSIS — Z170 Estrogen receptor positive status [ER+]: Secondary | ICD-10-CM | POA: Insufficient documentation

## 2019-05-21 LAB — COMPREHENSIVE METABOLIC PANEL
ALT: 36 U/L (ref 0–44)
AST: 24 U/L (ref 15–41)
Albumin: 4.2 g/dL (ref 3.5–5.0)
Alkaline Phosphatase: 68 U/L (ref 38–126)
Anion gap: 11 (ref 5–15)
BUN: 11 mg/dL (ref 6–20)
CO2: 23 mmol/L (ref 22–32)
Calcium: 9.7 mg/dL (ref 8.9–10.3)
Chloride: 106 mmol/L (ref 98–111)
Creatinine, Ser: 1.6 mg/dL — ABNORMAL HIGH (ref 0.44–1.00)
GFR calc Af Amer: 40 mL/min — ABNORMAL LOW (ref >60)
GFR calc non Af Amer: 35 mL/min — ABNORMAL LOW (ref >60)
Glucose, Bld: 213 mg/dL — ABNORMAL HIGH (ref 70–99)
Potassium: 4.1 mmol/L (ref 3.5–5.1)
Sodium: 140 mmol/L (ref 135–145)
Total Bilirubin: 0.5 mg/dL (ref 0.3–1.2)
Total Protein: 7.8 g/dL (ref 6.5–8.1)

## 2019-05-21 LAB — CBC WITH DIFFERENTIAL/PLATELET
Abs Immature Granulocytes: 0.02 10*3/uL (ref 0.00–0.07)
Basophils Absolute: 0 10*3/uL (ref 0.0–0.1)
Basophils Relative: 1 %
Eosinophils Absolute: 0.1 10*3/uL (ref 0.0–0.5)
Eosinophils Relative: 2 %
HCT: 40.4 % (ref 36.0–46.0)
Hemoglobin: 13.8 g/dL (ref 12.0–15.0)
Immature Granulocytes: 0 %
Lymphocytes Relative: 25 %
Lymphs Abs: 1.7 10*3/uL (ref 0.7–4.0)
MCH: 28.3 pg (ref 26.0–34.0)
MCHC: 34.2 g/dL (ref 30.0–36.0)
MCV: 83 fL (ref 80.0–100.0)
Monocytes Absolute: 0.6 10*3/uL (ref 0.1–1.0)
Monocytes Relative: 9 %
Neutro Abs: 4.2 10*3/uL (ref 1.7–7.7)
Neutrophils Relative %: 63 %
Platelets: 206 10*3/uL (ref 150–400)
RBC: 4.87 MIL/uL (ref 3.87–5.11)
RDW: 13.1 % (ref 11.5–15.5)
WBC: 6.7 10*3/uL (ref 4.0–10.5)
nRBC: 0 % (ref 0.0–0.2)

## 2019-05-21 MED ORDER — ANASTROZOLE 1 MG PO TABS: 1.0000 mg | ORAL_TABLET | Freq: Every day | ORAL | 4 refills | Status: AC

## 2019-05-31 NOTE — Telephone Encounter (Signed)
Late entry - spoke to patient instruction given from 05/17/19  , avs summary mailed  Patient voiced understaindg

## 2019-07-02 ENCOUNTER — Other Ambulatory Visit: Payer: Self-pay | Admitting: Internal Medicine

## 2019-07-02 ENCOUNTER — Telehealth: Payer: Self-pay | Admitting: Internal Medicine

## 2019-07-02 NOTE — Telephone Encounter (Signed)
New Message   Patient wants to know if she's considered high risk with Covid-19 and if so she needs a letter.

## 2019-07-02 NOTE — Telephone Encounter (Signed)
New Message      *STAT* If patient is at the pharmacy, call can be transferred to refill team.   1. Which medications need to be refilled? (please list name of each medication and dose if known) Rosuvastatin 10mg   2. Which pharmacy/location (including street and city if local pharmacy) is medication to be sent to? [facility] [address] they [facility] 30 day or 90 day supply? 90 day supply

## 2019-07-03 MED ORDER — ROSUVASTATIN CALCIUM 10 MG PO TABS: 10.0000 mg | ORAL_TABLET | Freq: Every evening | ORAL | 3 refills | Status: DC

## 2019-07-09 NOTE — Telephone Encounter (Signed)
Yes, with CAD, NSTEMI and HTN, she can be considered high risk based on current CDC guidelines.   Letter can be drafted suggesting that she has high risk medical conditions for COVID-19.   Currently ,our practice is not commenting on what this implies for mask usage or exemption from work. Further issues can be addressed with her primary care physician.  Please let me know if she has additional questions.

## 2019-07-09 NOTE — Telephone Encounter (Signed)
Follow Up:      Pt calling back to see if she is a high risk for COVID-19. If so, she needs a letter stating this for her her job. She is a Pharmacist, hospital. She will give you more details when you call her.

## 2019-07-09 NOTE — Telephone Encounter (Signed)
Is patient high risk in your opinion?  If so, okay for letter?

## 2019-07-09 NOTE — Telephone Encounter (Signed)
Wrote up letter- will stamp and place up front for patient to come pick up. 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

## 2019-07-11 ENCOUNTER — Telehealth: Payer: Self-pay

## 2019-07-11 NOTE — Telephone Encounter (Signed)
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  Per MD okay to provide letter, patient aware.  No further needs.  Letter emailed securely to provided e-mail.

## 2019-11-08 LAB — HM MAMMOGRAPHY

## 2019-11-12 ENCOUNTER — Encounter (HOSPITAL_COMMUNITY): Payer: Self-pay

## 2019-11-12 ENCOUNTER — Inpatient Hospital Stay (HOSPITAL_COMMUNITY)
Admission: EM | Admit: 2019-11-12 | Discharge: 2019-11-14 | DRG: 066 | Disposition: A | Discharge status: Home / Self Care | Payer: BC Managed Care – PPO | Attending: Internal Medicine | Admitting: Internal Medicine

## 2019-11-12 ENCOUNTER — Telehealth: Payer: Self-pay | Admitting: Internal Medicine

## 2019-11-12 ENCOUNTER — Other Ambulatory Visit: Payer: Self-pay

## 2019-11-12 ENCOUNTER — Ambulatory Visit: Payer: Self-pay

## 2019-11-12 ENCOUNTER — Emergency Department (HOSPITAL_COMMUNITY): Payer: BC Managed Care – PPO

## 2019-11-12 DIAGNOSIS — I672 Cerebral atherosclerosis: Secondary | ICD-10-CM | POA: Diagnosis present

## 2019-11-12 DIAGNOSIS — E785 Hyperlipidemia, unspecified: Secondary | ICD-10-CM | POA: Diagnosis present

## 2019-11-12 DIAGNOSIS — I071 Rheumatic tricuspid insufficiency: Secondary | ICD-10-CM | POA: Diagnosis present

## 2019-11-12 DIAGNOSIS — E669 Obesity, unspecified: Secondary | ICD-10-CM | POA: Diagnosis present

## 2019-11-12 DIAGNOSIS — Z6830 Body mass index (BMI) 30.0-30.9, adult: Secondary | ICD-10-CM

## 2019-11-12 DIAGNOSIS — Z8673 Personal history of transient ischemic attack (TIA), and cerebral infarction without residual deficits: Secondary | ICD-10-CM | POA: Diagnosis present

## 2019-11-12 DIAGNOSIS — E1151 Type 2 diabetes mellitus with diabetic peripheral angiopathy without gangrene: Secondary | ICD-10-CM | POA: Diagnosis present

## 2019-11-12 DIAGNOSIS — E119 Type 2 diabetes mellitus without complications: Secondary | ICD-10-CM | POA: Diagnosis not present

## 2019-11-12 DIAGNOSIS — E782 Mixed hyperlipidemia: Secondary | ICD-10-CM | POA: Diagnosis not present

## 2019-11-12 DIAGNOSIS — I6381 Other cerebral infarction due to occlusion or stenosis of small artery: Principal | ICD-10-CM | POA: Diagnosis present

## 2019-11-12 DIAGNOSIS — E1159 Type 2 diabetes mellitus with other circulatory complications: Secondary | ICD-10-CM | POA: Diagnosis not present

## 2019-11-12 DIAGNOSIS — I63032 Cerebral infarction due to thrombosis of left carotid artery: Secondary | ICD-10-CM

## 2019-11-12 DIAGNOSIS — Z7982 Long term (current) use of aspirin: Secondary | ICD-10-CM | POA: Diagnosis not present

## 2019-11-12 DIAGNOSIS — R739 Hyperglycemia, unspecified: Secondary | ICD-10-CM | POA: Diagnosis not present

## 2019-11-12 DIAGNOSIS — Z8249 Family history of ischemic heart disease and other diseases of the circulatory system: Secondary | ICD-10-CM | POA: Diagnosis not present

## 2019-11-12 DIAGNOSIS — R531 Weakness: Secondary | ICD-10-CM | POA: Diagnosis present

## 2019-11-12 DIAGNOSIS — Z955 Presence of coronary angioplasty implant and graft: Secondary | ICD-10-CM

## 2019-11-12 DIAGNOSIS — I10 Essential (primary) hypertension: Secondary | ICD-10-CM | POA: Diagnosis present

## 2019-11-12 DIAGNOSIS — Z800 Family history of malignant neoplasm of digestive organs: Secondary | ICD-10-CM

## 2019-11-12 DIAGNOSIS — Z20828 Contact with and (suspected) exposure to other viral communicable diseases: Secondary | ICD-10-CM | POA: Diagnosis present

## 2019-11-12 DIAGNOSIS — R29701 NIHSS score 1: Secondary | ICD-10-CM | POA: Diagnosis present

## 2019-11-12 DIAGNOSIS — I6330 Cerebral infarction due to thrombosis of unspecified cerebral artery: Secondary | ICD-10-CM | POA: Diagnosis not present

## 2019-11-12 DIAGNOSIS — Z853 Personal history of malignant neoplasm of breast: Secondary | ICD-10-CM

## 2019-11-12 DIAGNOSIS — I639 Cerebral infarction, unspecified: Secondary | ICD-10-CM | POA: Diagnosis present

## 2019-11-12 DIAGNOSIS — R278 Other lack of coordination: Secondary | ICD-10-CM | POA: Diagnosis present

## 2019-11-12 DIAGNOSIS — E1169 Type 2 diabetes mellitus with other specified complication: Secondary | ICD-10-CM | POA: Diagnosis present

## 2019-11-12 DIAGNOSIS — I2510 Atherosclerotic heart disease of native coronary artery without angina pectoris: Secondary | ICD-10-CM | POA: Diagnosis present

## 2019-11-12 DIAGNOSIS — Z823 Family history of stroke: Secondary | ICD-10-CM | POA: Diagnosis not present

## 2019-11-12 DIAGNOSIS — Z79811 Long term (current) use of aromatase inhibitors: Secondary | ICD-10-CM | POA: Diagnosis not present

## 2019-11-12 DIAGNOSIS — I361 Nonrheumatic tricuspid (valve) insufficiency: Secondary | ICD-10-CM | POA: Diagnosis not present

## 2019-11-12 DIAGNOSIS — Z79899 Other long term (current) drug therapy: Secondary | ICD-10-CM

## 2019-11-12 DIAGNOSIS — Z923 Personal history of irradiation: Secondary | ICD-10-CM | POA: Diagnosis not present

## 2019-11-12 LAB — CBC WITH DIFFERENTIAL/PLATELET
Abs Immature Granulocytes: 0.01 10*3/uL (ref 0.00–0.07)
Basophils Absolute: 0.1 10*3/uL (ref 0.0–0.1)
Basophils Relative: 1 %
Eosinophils Absolute: 0.1 10*3/uL (ref 0.0–0.5)
Eosinophils Relative: 1 %
HCT: 41.7 % (ref 36.0–46.0)
Hemoglobin: 14.1 g/dL (ref 12.0–15.0)
Immature Granulocytes: 0 %
Lymphocytes Relative: 23 %
Lymphs Abs: 1.7 10*3/uL (ref 0.7–4.0)
MCH: 28.4 pg (ref 26.0–34.0)
MCHC: 33.8 g/dL (ref 30.0–36.0)
MCV: 84.1 fL (ref 80.0–100.0)
Monocytes Absolute: 0.6 10*3/uL (ref 0.1–1.0)
Monocytes Relative: 8 %
Neutro Abs: 4.7 10*3/uL (ref 1.7–7.7)
Neutrophils Relative %: 67 %
Platelets: 232 10*3/uL (ref 150–400)
RBC: 4.96 MIL/uL (ref 3.87–5.11)
RDW: 12.7 % (ref 11.5–15.5)
WBC: 7 10*3/uL (ref 4.0–10.5)
nRBC: 0 % (ref 0.0–0.2)

## 2019-11-12 LAB — COMPREHENSIVE METABOLIC PANEL
ALT: 48 U/L — ABNORMAL HIGH (ref 0–44)
AST: 29 U/L (ref 15–41)
Albumin: 4.4 g/dL (ref 3.5–5.0)
Alkaline Phosphatase: 68 U/L (ref 38–126)
Anion gap: 14 (ref 5–15)
BUN: 13 mg/dL (ref 8–23)
CO2: 23 mmol/L (ref 22–32)
Calcium: 10.1 mg/dL (ref 8.9–10.3)
Chloride: 103 mmol/L (ref 98–111)
Creatinine, Ser: 1.37 mg/dL — ABNORMAL HIGH (ref 0.44–1.00)
GFR calc Af Amer: 48 mL/min — ABNORMAL LOW (ref >60)
GFR calc non Af Amer: 42 mL/min — ABNORMAL LOW (ref >60)
Glucose, Bld: 217 mg/dL — ABNORMAL HIGH (ref 70–99)
Potassium: 4 mmol/L (ref 3.5–5.1)
Sodium: 140 mmol/L (ref 135–145)
Total Bilirubin: 0.6 mg/dL (ref 0.3–1.2)
Total Protein: 8.2 g/dL — ABNORMAL HIGH (ref 6.5–8.1)

## 2019-11-12 LAB — URINALYSIS, ROUTINE W REFLEX MICROSCOPIC
Bilirubin Urine: NEGATIVE
Glucose, UA: NEGATIVE mg/dL
Hgb urine dipstick: NEGATIVE
Ketones, ur: NEGATIVE mg/dL
Nitrite: NEGATIVE
Protein, ur: NEGATIVE mg/dL
Specific Gravity, Urine: 1.018 (ref 1.005–1.030)
pH: 5 (ref 5.0–8.0)

## 2019-11-12 LAB — I-STAT CHEM 8, ED
BUN: 11 mg/dL (ref 8–23)
Calcium, Ion: 1.26 mmol/L (ref 1.15–1.40)
Chloride: 106 mmol/L (ref 98–111)
Creatinine, Ser: 1.2 mg/dL — ABNORMAL HIGH (ref 0.44–1.00)
Glucose, Bld: 190 mg/dL — ABNORMAL HIGH (ref 70–99)
HCT: 40 % (ref 36.0–46.0)
Hemoglobin: 13.6 g/dL (ref 12.0–15.0)
Potassium: 3.8 mmol/L (ref 3.5–5.1)
Sodium: 138 mmol/L (ref 135–145)
TCO2: 24 mmol/L (ref 22–32)

## 2019-11-12 LAB — APTT: aPTT: 26 seconds (ref 24–36)

## 2019-11-12 LAB — PROTIME-INR
INR: 0.9 (ref 0.8–1.2)
Prothrombin Time: 12.2 seconds (ref 11.4–15.2)

## 2019-11-12 MED ORDER — ASPIRIN EC 81 MG PO TBEC
81.0000 mg | DELAYED_RELEASE_TABLET | Freq: Every day | ORAL | Status: DC
  Administered 2019-11-12 – 2019-11-14 (×3): 81 mg via ORAL
  Filled 2019-11-12 (×3): qty 1

## 2019-11-12 MED ORDER — VITAMIN D 25 MCG (1000 UNIT) PO TABS
2000.0000 [IU] | ORAL_TABLET | Freq: Every day | ORAL | Status: DC
  Administered 2019-11-12 – 2019-11-14 (×3): 2000 [IU] via ORAL
  Filled 2019-11-12 (×4): qty 2

## 2019-11-12 MED ORDER — ATORVASTATIN CALCIUM 80 MG PO TABS
80.0000 mg | ORAL_TABLET | Freq: Every day | ORAL | Status: DC
  Filled 2019-11-12: qty 1

## 2019-11-12 MED ORDER — LABETALOL HCL 5 MG/ML IV SOLN: 5.0000 mg | INTRAVENOUS | Status: DC | PRN

## 2019-11-12 MED ORDER — SODIUM CHLORIDE 0.9 % IV BOLUS
500.0000 mL | Freq: Once | INTRAVENOUS | Status: AC
  Administered 2019-11-12: 500 mL via INTRAVENOUS

## 2019-11-12 MED ORDER — ANASTROZOLE 1 MG PO TABS
1.0000 mg | ORAL_TABLET | Freq: Every day | ORAL | Status: DC
  Administered 2019-11-12 – 2019-11-13 (×2): 1 mg via ORAL
  Filled 2019-11-12 (×3): qty 1

## 2019-11-12 MED ORDER — SODIUM CHLORIDE 0.9 % IV SOLN
100.0000 mL/h | INTRAVENOUS | Status: DC
  Administered 2019-11-13 – 2019-11-14 (×2): 100 mL/h via INTRAVENOUS

## 2019-11-12 MED ORDER — ATENOLOL 25 MG PO TABS
50.0000 mg | ORAL_TABLET | Freq: Every day | ORAL | Status: DC
  Administered 2019-11-13 – 2019-11-14 (×2): 50 mg via ORAL
  Filled 2019-11-12: qty 1
  Filled 2019-11-12: qty 2

## 2019-11-12 MED ORDER — VITAMIN B-12 100 MCG PO TABS
500.0000 ug | ORAL_TABLET | Freq: Every day | ORAL | Status: DC
  Administered 2019-11-12 – 2019-11-14 (×3): 500 ug via ORAL
  Filled 2019-11-12: qty 1
  Filled 2019-11-12: qty 5
  Filled 2019-11-12: qty 1

## 2019-11-12 NOTE — Telephone Encounter (Signed)
  Patient called stating that she has had elevated BP at her [facility] office Friday.  She states she was upset because she was apprehensive about results.  She states that she was up till 3am with computer issues.  She attended a meeting today and now feels lightheaded when ambulating and has tingling in her rigtht hand and finger tips. She has no means of checking her BP at home. She denies other symptoms but states the symptoms are persisting now. Per protocol patient was go to ER.  She was offered 911 but refused. She is having her son drive her. She was encouraged to call 911 if symptoms worsen or she experiences other symptoms.  She verbalized understanding of all information but will still have her son transport her to ER. Reason for Disposition . [1] Numbness (i.e., loss of sensation) of the face, arm / hand, or leg / foot on one side of the body AND [2] sudden onset AND [3] present now  Answer Assessment - Initial Assessment Questions 1. SYMPTOM: "What is the main symptom you are concerned about?" (e.g., weakness, numbness)     Lightheaded fingers tips tingly 2. ONSET: "When did this start?" (minutes, hours, days; while sleeping)    today 3. LAST NORMAL: "When was the last time you were normal (no symptoms)?"     yesterday 4. PATTERN "Does this come and go, or has it been constant since it started?"  "Is it present now?"    Constant now 5. CARDIAC SYMPTOMS: "Have you had any of the following symptoms: chest pain, difficulty breathing, palpitations?"    no 6. NEUROLOGIC SYMPTOMS: "Have you had any of the following symptoms: headache, dizziness, vision loss, double vision, changes in speech, unsteady on your feet?"     Dizzy, rt side feels swoolen 7. OTHER SYMPTOMS: "Do you have any other symptoms?"    No 8. PREGNANCY: "Is there any chance you are pregnant?" "When was your last menstrual period?"    N/A  Protocols used: NEUROLOGIC DEFICIT-A-AH

## 2019-11-12 NOTE — Telephone Encounter (Signed)
noted 

## 2019-11-12 NOTE — ED Notes (Signed)
Pt was found calling out after falling in the bathroom. When asked how the pt fell she stated that she was trying to clean up urine on the floor and fell back on her butt. Denies pain, LOC, hitting head. Pt was able to ambulate back to the room. No neurological change noted from previous assessment.

## 2019-11-12 NOTE — Telephone Encounter (Signed)
Patient calling to let nurse know she made it to the hospital and her BP is 235/100. She is waiting to be seen

## 2019-11-12 NOTE — ED Notes (Signed)
Pt given soup, sandwich and water at this time. No other needs expressed at this time.

## 2019-11-12 NOTE — ED Notes (Signed)
Patient transported to MRI 

## 2019-11-12 NOTE — H&P (Signed)
History and Physical    Audrey Fischer [id number] DOB: 12/09/1955 DOA: 11/12/2019  PCP: Hoyt Koch, MD  Patient coming from: Home  I have personally briefly reviewed patient's old medical records in [place]  Chief Complaint: Dizziness, imbalance, lack of coordination  HPI: Audrey Fischer is a 63 y.o. female with medical history significant of breast cancer s/p radiation and surgery in remission for about 4 years now, CAD s/p PCI, hypertension, hyperlipidemia brought to the ED by her son for the above chief complaint. History provided by the patient.  Symptoms started last night around 4 AM when she was working on the computer.  She noticed she was unable to coordinate her mouse and focus like she normally does.  She went to sleep and in the morning when she got up to use the bathroom, she felt dizzy and had to hold onto objects around her to go to the bathroom.  She also noticed right finger tingling.  Denies any other complaints including any extremity weakness, difficulty swallowing, slurred speech, difficulty comprehending or talking, vision problem.  Denies any respiratory symptoms or close contact with a COVID-19 positive patient.  ED Course: While in the ED, MRI brain showed acute left posterior external capsule infarct.  Study was incomplete as the patient refused.  Neurology was consulted and recommended transfer to [facility].  Hospitalist called for admission.  Review of Systems: As per HPI otherwise 10 point review of systems negative.   Past Medical History:  Diagnosis Date  . Breast cancer ([place]) 07/09/15   left breast,  lower-inner quadrant   . Coronary artery disease    cardiologist-  dr Donnetta Hutching [facility] regional physicians, [facility] cardiology in [facility])---  hx PCI w/ stents to OM and LAD 02-08-2008  . Hyperlipidemia   . Hypertension   . PMB (postmenopausal bleeding)   . S/P coronary artery stent placement     Past Surgical History:  Procedure  Laterality Date  . CESAREAN SECTION  10-09-1996  . CORONARY ANGIOPLASTY WITH STENT PLACEMENT  02-08-2008   dr Donnetta Hutching   PCI and stent to OM and LAD  . DILATATION & CURRETTAGE/HYSTEROSCOPY WITH RESECTOCOPE N/A 08/15/2015   Procedure: DILATATION & CURETTAGE/HYSTEROSCOPY ;  Surgeon: Eldred Manges, MD;  Location: [place];  Service: Gynecology;  Laterality: N/A;  . DILATION AND CURETTAGE OF UTERUS  1994   blighted ovum     reports that she has never smoked. She has never used smokeless tobacco. She reports current alcohol use. She reports that she does not use drugs.  Allergies  Allergen Reactions  . Capsule [Gelatin] Hives    Any medication that is in gel capsule form  . Capsaicin Other (See Comments)    Unknown rxn    Family History  Problem Relation Age of Onset  . Heart disease Mother   . Stroke Mother   . Colon cancer Mother 77  . Heart disease Father      Prior to Admission medications   Medication Sig Start Date End Date Taking? Authorizing Provider  anastrozole (ARIMIDEX) 1 MG tablet Take 1 tablet (1 mg total) by mouth daily. Patient taking differently: Take 1 mg by mouth at bedtime.  05/21/19  Yes Magrinat, Virgie Dad, MD  Ascorbic Acid (VITAMIN C GUMMIE PO) Take 2,000 mg by mouth daily.   Yes [provider]  aspirin 81 MG tablet Take 81 mg by mouth daily.   Yes [provider]  atenolol (TENORMIN) 100  MG tablet Take 50 mg by mouth at bedtime.  11/05/17  Yes [provider]  Cholecalciferol (VITAMIN D3) 2000 UNITS TABS Take 1 tablet by mouth daily.   Yes [provider]  nitroGLYCERIN (NITROSTAT) 0.4 MG SL tablet Place 0.4 mg under the tongue every 5 (five) minutes as needed for chest pain.  06/05/15  Yes [provider]  rosuvastatin (CRESTOR) 10 MG tablet Take 1 tablet (10 mg total) by mouth every evening. Patient taking differently: Take 10 mg by mouth at bedtime.  07/03/19  Yes Elouise Munroe, MD  vitamin  B-12 (CYANOCOBALAMIN) 500 MCG tablet Take 1 tablet (500 mcg total) by mouth daily. 05/21/19  Yes Magrinat, Virgie Dad, MD    Physical Exam: Vitals:   11/12/19 1429 11/12/19 1530 11/12/19 1651 11/12/19 1726  BP: (!) 185/91 (!) 190/92 (!) 178/92 (!) 200/99  Pulse: 63 63 (!) 58 95  Resp: 18 15  (!) 23  Temp:      TempSrc:      SpO2: 99% 99% 100% 100%  Weight:      Height:        Constitutional: NAD, calm, comfortable Vitals:   11/12/19 1429 11/12/19 1530 11/12/19 1651 11/12/19 1726  BP: (!) 185/91 (!) 190/92 (!) 178/92 (!) 200/99  Pulse: 63 63 (!) 58 95  Resp: 18 15  (!) 23  Temp:      TempSrc:      SpO2: 99% 99% 100% 100%  Weight:      Height:       Eyes: PERRL, lids and conjunctivae normal ENMT: Mucous membranes are moist. Posterior pharynx clear of any exudate or lesions.Normal dentition.  Neck: normal, supple, no masses, no thyromegaly Respiratory: clear to auscultation bilaterally, no wheezing, no crackles. Normal respiratory effort. No accessory muscle use.  Cardiovascular: Regular rate and rhythm, no murmurs / rubs / gallops. No extremity edema. 2+ pedal pulses.  Abdomen: no tenderness, no masses palpated. No hepatosplenomegaly. Bowel sounds positive.  Musculoskeletal: no clubbing / cyanosis. No joint deformity upper and lower extremities. Good ROM, no contractures. Normal muscle tone.  Skin: no rashes, lesions, ulcers. No induration Neurologic: CN 2-12 grossly intact.  Comprehension and expressive speech intact.  Able to name objects.  No obvious focal neurological deficit except decreased coordination on finger-to-nose test right upper extremity and 4+/5 strength right lower extremity.  Gait not assessed. Psychiatric: Normal judgment and insight. Alert and oriented x 3. Normal mood.   Labs on Admission: I have personally reviewed following labs and imaging studies  CBC: Recent Labs  Lab 11/12/19 1431 11/12/19 1749  WBC 7.0  --   NEUTROABS 4.7  --   HGB 14.1 13.6   HCT 41.7 40.0  MCV 84.1  --   PLT 232  --    Basic Metabolic Panel: Recent Labs  Lab 11/12/19 1431 11/12/19 1749  NA 140 138  K 4.0 3.8  CL 103 106  CO2 23  --   GLUCOSE 217* 190*  BUN 13 11  CREATININE 1.37* 1.20*  CALCIUM 10.1  --    GFR: Estimated Creatinine Clearance: 58.7 mL/min (A) (by C-G formula based on SCr of 1.2 mg/dL (H)). Liver Function Tests: Recent Labs  Lab 11/12/19 1431  AST 29  ALT 48*  ALKPHOS 68  BILITOT 0.6  PROT 8.2*  ALBUMIN 4.4   No results for input(s): LIPASE, AMYLASE in the last 168 hours. No results for input(s): AMMONIA in the last 168 hours. Coagulation Profile: Recent Labs  Lab 11/12/19 1737  INR 0.9   Cardiac Enzymes: No results for input(s): CKTOTAL, CKMB, CKMBINDEX, TROPONINI in the last 168 hours. BNP (last 3 results) No results for input(s): PROBNP in the last 8760 hours. HbA1C: No results for input(s): HGBA1C in the last 72 hours. CBG: No results for input(s): GLUCAP in the last 168 hours. Lipid Profile: No results for input(s): CHOL, HDL, LDLCALC, TRIG, CHOLHDL, LDLDIRECT in the last 72 hours. Thyroid Function Tests: No results for input(s): TSH, T4TOTAL, FREET4, T3FREE, THYROIDAB in the last 72 hours. Anemia Panel: No results for input(s): VITAMINB12, FOLATE, FERRITIN, TIBC, IRON, RETICCTPCT in the last 72 hours. Urine analysis:    Component Value Date/Time   COLORURINE YELLOW 11/12/2019 1737   APPEARANCEUR CLOUDY (A) 11/12/2019 1737   LABSPEC 1.018 11/12/2019 1737   PHURINE 5.0 11/12/2019 1737   GLUCOSEU NEGATIVE 11/12/2019 1737   HGBUR NEGATIVE 11/12/2019 1737   [place] 11/12/2019 1737   BILIRUBINUR Neg 08/11/2017 [place] 11/12/2019 1737   PROTEINUR NEGATIVE 11/12/2019 1737   UROBILINOGEN 0.2 08/11/2017 1315   NITRITE NEGATIVE 11/12/2019 1737   LEUKOCYTESUR SMALL (A) 11/12/2019 1737    Radiological Exams on Admission: MR Brain Wo Contrast (neuro protocol)  Result Date:  11/12/2019 CLINICAL DATA:  Right upper extremity weakness.  Rule out stroke EXAM: MRI HEAD WITHOUT CONTRAST TECHNIQUE: Multiplanar, multiecho pulse sequences of the brain and surrounding structures were obtained without intravenous contrast. COMPARISON:  None. FINDINGS: Brain: Acute infarct in the posterior external capsule on the left. No other acute infarct. Chronic infarct in the left corona radiata. Small chronic infarcts in the cerebellum bilaterally. Brainstem normal. Ventricle size normal. No mass or edema. Incomplete study.  The patient refused to complete the study. Vascular: Normal arterial flow voids. Skull and upper cervical spine: Negative Sinuses/Orbits: Negative Other: None IMPRESSION: Acute infarct in the posterior external capsule on the left. Mild chronic ischemia. The patient refused to complete the study. Electronically Signed   By: Franchot Gallo M.D.   On: 11/12/2019 16:35    EKG: Independently reviewed.   Assessment/Plan Principal Problem:   CVA (cerebral vascular accident) ([place]) Active Problems:   HTN (hypertension)   Hyperlipidemia  Acute left internal capsule infarct Seen on MRI brain.  Presentation beyond timeframe for TPA. Risk factors include age, hypertension, hypercholesterolemia, anastrozole Non-smoker Neurology recommended transfer to [facility] Permissive hypertension for 24 hours, labetalol as needed keep BP < 200/110 Aspirin, high intensity statin PT/OT Dysphagia screen Risk factor modification Lipid panel, hemoglobin A1c in a.m. Telemetry Frequent neurological assessments  Hypertension Elevated, will hold off on antihypertensives at this time to maintain permissive hypertension Takes atenolol 50 mg daily at home Restart tomorrow morning  CAD s/p stent Continue aspirin, statin  Hyperlipidemia On rosuvastatin at home We will switch to atorvastatin 80 mg for now  History of breast cancer s/p radiation and surgery In remission for 4 years now    DVT prophylaxis: SCD/Compression stockings Code Status: Full code discussed with patient during this hospital stay Family Communication: Discussed care with patient and daughter at bedside Disposition Plan: Transfer to [place] called: Neurology called by the emergency department Admission status: Inpatient   Lucky Cowboy MD [facility]  If 7PM-7AM, please contact night-coverage 11/12/2019, 6:34 PM

## 2019-11-12 NOTE — ED Triage Notes (Signed)
Pt was has hx of HTN . Pt states that she has had tingling and has felt off. Pt did take an extra dose of her BP meds today.

## 2019-11-12 NOTE — ED Notes (Signed)
Pt's daughter approached the nurse desk and requested help to get the pt to the restroom. NT assisted pt to the restroom. Pt ambulated with no issues. NT educated pt on how to call for assistance using the string next to the toilet. Pt stated she understood. Roughly 3 minutes later, the NT checked on the pt and the pt was yelling for help. NT entered the restroom to find the pt on the floor under the sink. RN and NT was able to get the pt back on her feet and ambulate her back to her room. Pt back in bed at this time. Pt reported she had urinated on the floor and was attempting to clean it up when she fell. Pt reports she did not hit her head. RN aware.

## 2019-11-12 NOTE — ED Provider Notes (Signed)
[place] DEPT Provider Note   CSN: [id number] Arrival date & time: 11/12/19  0930     History Chief Complaint  Patient presents with  . Hypertension    Audrey Fischer is a 63 y.o. female.  HPI    Patient presents with concern of right hand tingling, unsteadiness. Patient has no history of stroke, does have history of breast cancer in the distant past, as well as CAD, hypertension.  She notes that she takes metoprolol 50 mg daily. For the past day at least, patient has had unsteadiness, and tingling sensation in her right upper extremity. No vision changes, no known weakness, no confusion, disorientation, no vomiting, diarrhea, nausea. 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 Today, with persistency with this difficulty, as well as with concern for hypertension she is sent here for evaluation.    Past Medical History:  Diagnosis Date  . Breast cancer ([place]) 07/09/15   left breast,  lower-inner quadrant   . Coronary artery disease    cardiologist-  dr Donnetta Hutching [facility] regional physicians, [facility] cardiology in [facility])---  hx PCI w/ stents to OM and LAD 02-08-2008  . Hyperlipidemia   . Hypertension   . PMB (postmenopausal bleeding)   . S/P coronary artery stent placement     Patient Active Problem List   Diagnosis Date Noted  . Dizziness 01/03/2019  . Coronary artery disease due to lipid rich plaque 01/03/2019  . Sialoadenitis 04/04/2018  . Cough 02/21/2018  . Hematuria 08/11/2017  . Malignant neoplasm of overlapping sites of left breast in female, estrogen receptor positive ([place]) 03/28/2017  . Hearing loss due to cerumen impaction, right 12/23/2016  . Routine general medical examination at a health care facility 10/02/2016  . Unexplained weight gain 06/17/2016  . Hyperlipidemia 08/27/2015  . PMB  (postmenopausal bleeding) 08/14/2015  . Fibroids 08/14/2015  . Cervical stenosis (uterine cervix) 08/14/2015  . Thickened endometrium 08/14/2015  . HTN (hypertension) 06/02/2013    Past Surgical History:  Procedure Laterality Date  . CESAREAN SECTION  10-09-1996  . CORONARY ANGIOPLASTY WITH STENT PLACEMENT  02-08-2008   dr Donnetta Hutching   PCI and stent to OM and LAD  . DILATATION & CURRETTAGE/HYSTEROSCOPY WITH RESECTOCOPE N/A 08/15/2015   Procedure: DILATATION & CURETTAGE/HYSTEROSCOPY ;  Surgeon: Eldred Manges, MD;  Location: [place];  Service: Gynecology;  Laterality: N/A;  . DILATION AND CURETTAGE OF UTERUS  1994   blighted ovum     OB History   No obstetric history on file.     Family History  Problem Relation Age of Onset  . Heart disease Mother   . Stroke Mother   . Colon cancer Mother 8  . Heart disease Father     Social History   Tobacco Use  . Smoking status: Never Smoker  . Smokeless tobacco: Never Used  Substance Use Topics  . Alcohol use: Yes    Alcohol/week: 0.0 standard drinks    Comment: occasionally  . Drug use: No    Home Medications Prior to Admission medications   Medication Sig Start Date End Date Taking? Authorizing Provider  anastrozole (ARIMIDEX) 1 MG tablet Take 1 tablet (1 mg total) by mouth daily. Patient taking differently: Take 1 mg by mouth at bedtime.  05/21/19  Yes Magrinat, Virgie Dad, MD  Ascorbic Acid (VITAMIN C GUMMIE PO)  Take 2,000 mg by mouth daily.   Yes [provider]  aspirin 81 MG tablet Take 81 mg by mouth daily.   Yes [provider]  atenolol (TENORMIN) 100 MG tablet Take 50 mg by mouth at bedtime.  11/05/17  Yes [provider]  Cholecalciferol (VITAMIN D3) 2000 UNITS TABS Take 1 tablet by mouth daily.   Yes [provider]  nitroGLYCERIN (NITROSTAT) 0.4 MG SL tablet Place 0.4 mg under the tongue every 5 (five) minutes as needed for chest pain.  06/05/15  Yes [provider]  rosuvastatin (CRESTOR) 10 MG tablet Take 1 tablet (10 mg total) by mouth every evening. Patient taking differently: Take 10 mg by mouth at bedtime.  07/03/19  Yes Elouise Munroe, MD  vitamin B-12 (CYANOCOBALAMIN) 500 MCG tablet Take 1 tablet (500 mcg total) by mouth daily. 05/21/19  Yes Magrinat, Virgie Dad, MD    Allergies    Capsule [gelatin] and Capsaicin  Review of Systems   Review of Systems  Constitutional:       Per HPI, otherwise negative  HENT:       Per HPI, otherwise negative  Respiratory:       Per HPI, otherwise negative  Cardiovascular:       Per HPI, otherwise negative  Gastrointestinal: Negative for vomiting.  Endocrine:       Negative aside from HPI  Genitourinary:       Neg aside from HPI   Musculoskeletal:       Per HPI, otherwise negative  Skin: Negative.   Neurological: Positive for weakness and numbness. Negative for syncope.    Physical Exam Updated Vital Signs BP (!) 190/92   Pulse 63   Temp 98.6 F (37 C) (Oral)   Resp 15   Ht 5' 8.5" (1.74 m)   Wt 91.2 kg   SpO2 99%   BMI 30.12 kg/m   Physical Exam Vitals and nursing note reviewed.  Constitutional:      General: She is not in acute distress.    Appearance: She is well-developed.  HENT:     Head: Normocephalic and atraumatic.  Eyes:     Conjunctiva/sclera: Conjunctivae normal.  Cardiovascular:     Rate and Rhythm: Normal rate and regular rhythm.  Pulmonary:     Effort: Pulmonary effort is normal. No respiratory distress.     Breath sounds: Normal breath sounds. No stridor.  Abdominal:     General: There is no distension.  Skin:    General: Skin is warm and dry.  Neurological:     Mental Status: She is alert and oriented to person, place, and time.     Cranial Nerves: No cranial nerve deficit, dysarthria or facial asymmetry.     Motor: No tremor or atrophy.     Comments: Right grip strength 4/5, left 5/5. Subjective tingling in the right hand, no objective  deficiency. Distal lower extremities unremarkable neurovascularly.     ED Results / Procedures / Treatments   Labs (all labs ordered are listed, but only abnormal results are displayed) Labs Reviewed  COMPREHENSIVE METABOLIC PANEL - Abnormal; Notable for the following components:      Result Value   Glucose, Bld 217 (*)    Creatinine, Ser 1.37 (*)    Total Protein 8.2 (*)    ALT 48 (*)    GFR calc non Af Amer 42 (*)    GFR calc Af Amer 48 (*)    All other components within  normal limits  SARS CORONAVIRUS 2 (TAT 6-24 HRS)  CBC WITH DIFFERENTIAL/PLATELET  URINALYSIS, ROUTINE W REFLEX MICROSCOPIC  PROTIME-INR  APTT  I-STAT CHEM 8, ED    EKG EKG Interpretation  Date/Time:  Monday November 12 2019 14:22:03 EST Ventricular Rate:  73 PR Interval:    QRS Duration: 92 QT Interval:  372 QTC Calculation: 410 R Axis:   -5 Text Interpretation: Sinus rhythm Borderline repolarization abnormality Baseline wander Artifact Abnormal ECG Confirmed by Carmin Muskrat [phone]) on 11/12/2019 4:35:02 PM   Radiology MR Brain Wo Contrast (neuro protocol)  Result Date: 11/12/2019 CLINICAL DATA:  Right upper extremity weakness.  Rule out stroke EXAM: MRI HEAD WITHOUT CONTRAST TECHNIQUE: Multiplanar, multiecho pulse sequences of the brain and surrounding structures were obtained without intravenous contrast. COMPARISON:  None. FINDINGS: Brain: Acute infarct in the posterior external capsule on the left. No other acute infarct. Chronic infarct in the left corona radiata. Small chronic infarcts in the cerebellum bilaterally. Brainstem normal. Ventricle size normal. No mass or edema. Incomplete study.  The patient refused to complete the study. Vascular: Normal arterial flow voids. Skull and upper cervical spine: Negative Sinuses/Orbits: Negative Other: None IMPRESSION: Acute infarct in the posterior external capsule on the left. Mild chronic ischemia. The patient refused to complete the study.  Electronically Signed   By: Franchot Gallo M.D.   On: 11/12/2019 16:35    Procedures Procedures (including critical care time)  Medications Ordered in ED Medications  sodium chloride 0.9 % bolus 500 mL (has no administration in time range)    Followed by  0.9 %  sodium chloride infusion (has no administration in time range)    ED Course  I have reviewed the triage vital signs and the nursing notes.  Pertinent labs & imaging results that were available during my care of the patient were reviewed by me and considered in my medical decision making (see chart for details).    MDM Rules/Calculators/A&P                      5:06 PM Patient in no distress, awake, alert, no accompanied by her daughter.  We discussed all fine including evidence for acute infarct. As she has been symptomatic for 24 hours, no indication for additional imaging, intervention. However, with concern for acute new stroke, hypertension, patient will require admission for further monitoring, management, medication adjustments Patient's case discussed with our neurology colleagues patient will require transfer to our affiliated facility.  Final Clinical Impression(s) / ED Diagnoses Final diagnoses:  Acute cerebral infarction [facility])     Carmin Muskrat, MD 11/12/19 1708

## 2019-11-12 NOTE — Telephone Encounter (Signed)
Went to ED

## 2019-11-13 ENCOUNTER — Other Ambulatory Visit: Payer: Self-pay

## 2019-11-13 ENCOUNTER — Inpatient Hospital Stay (HOSPITAL_COMMUNITY): Payer: BC Managed Care – PPO

## 2019-11-13 ENCOUNTER — Encounter (HOSPITAL_COMMUNITY): Payer: Self-pay | Admitting: Internal Medicine

## 2019-11-13 DIAGNOSIS — I361 Nonrheumatic tricuspid (valve) insufficiency: Secondary | ICD-10-CM

## 2019-11-13 DIAGNOSIS — I6330 Cerebral infarction due to thrombosis of unspecified cerebral artery: Secondary | ICD-10-CM

## 2019-11-13 DIAGNOSIS — I639 Cerebral infarction, unspecified: Secondary | ICD-10-CM

## 2019-11-13 LAB — LIPID PANEL
Cholesterol: 297 mg/dL — ABNORMAL HIGH (ref 0–200)
HDL: 42 mg/dL (ref >40)
LDL Cholesterol: 202 mg/dL — ABNORMAL HIGH (ref 0–99)
Total CHOL/HDL Ratio: 7.1 RATIO
Triglycerides: 264 mg/dL — ABNORMAL HIGH (ref <150)
VLDL: 53 mg/dL — ABNORMAL HIGH (ref 0–40)

## 2019-11-13 LAB — HIV ANTIBODY (ROUTINE TESTING W REFLEX): HIV Screen 4th Generation wRfx: NONREACTIVE

## 2019-11-13 LAB — HEMOGLOBIN A1C
Hgb A1c MFr Bld: 9.7 % — ABNORMAL HIGH (ref 4.8–5.6)
Mean Plasma Glucose: 231.69 mg/dL

## 2019-11-13 LAB — SARS CORONAVIRUS 2 (TAT 6-24 HRS): SARS Coronavirus 2: NEGATIVE

## 2019-11-13 MED ORDER — ROSUVASTATIN CALCIUM 20 MG PO TABS: 20.0000 mg | ORAL_TABLET | Freq: Every day | ORAL | Status: DC

## 2019-11-13 MED ORDER — ROSUVASTATIN CALCIUM 20 MG PO TABS
40.0000 mg | ORAL_TABLET | Freq: Every day | ORAL | Status: DC
  Administered 2019-11-13: 21:00:00 40 mg via ORAL
  Filled 2019-11-13: qty 2

## 2019-11-13 MED ORDER — CLOPIDOGREL BISULFATE 300 MG PO TABS: 300.0000 mg | ORAL_TABLET | Freq: Every day | ORAL | Status: DC

## 2019-11-13 MED ORDER — SODIUM CHLORIDE 0.9% FLUSH: 9.0000 mL | INTRAVENOUS | Status: DC | PRN

## 2019-11-13 MED ORDER — CLOPIDOGREL BISULFATE 75 MG PO TABS
300.0000 mg | ORAL_TABLET | Freq: Once | ORAL | Status: AC
  Administered 2019-11-13: 300 mg via ORAL
  Filled 2019-11-13: qty 4

## 2019-11-13 MED ORDER — CLOPIDOGREL BISULFATE 75 MG PO TABS
75.0000 mg | ORAL_TABLET | Freq: Every day | ORAL | Status: DC
  Administered 2019-11-14: 10:00:00 75 mg via ORAL
  Filled 2019-11-13: qty 1

## 2019-11-13 MED ORDER — IOHEXOL 350 MG/ML SOLN
100.0000 mL | Freq: Once | INTRAVENOUS | Status: AC | PRN
  Administered 2019-11-13: 100 mL via INTRAVENOUS

## 2019-11-13 NOTE — Progress Notes (Signed)
PT Cancellation Note  Patient Details Name: Audrey Fischer MRN: [id number] DOB: 22-Jun-1956   Cancelled Treatment:    Reason Eval/Treat Not Completed: Patient at procedure or test/unavailable; checked on patient, she was out of the room at CT scan.  Will attempt later as time permits.    Reginia Naas 11/13/2019, 2:47 PM Magda Kiel, [place] [phone] 11/13/2019

## 2019-11-13 NOTE — ED Notes (Signed)
[facility] at bedside 

## 2019-11-13 NOTE — Consult Note (Addendum)
Neurology Consultation  Reason for Consult: Stroke Referring Physician: Posey Pronto  CC: Right face tingling, right fingertips tingling and gait abnormality  History is obtained from: Patient  HPI: Audrey Fischer is a 63 y.o. female with history status post coronary artery stent placement, postmenopausal bleeding, hypertension, hyperlipidemia, CAD and breast cancer in the past.  Patient states on the morning of 11/12/2019 she was up till 4 AM in the morning.  She was working on the computer when she suddenly noticed she was having difficulty with getting the mouse pointer to where she wanted to go "off missing the mark".  At that point patient thought nothing of it and went to sleep.  Upon waking up at 7 AM on same day she got up to go to the bathroom and noted that she was drifting to the right, and also had tingling in her right fingers.  She called her primary care doctor who told her to go to the emergency department.  Patient arrived by car.  She arrived at the hospital at 9:30 AM on 11/13/2019.  Currently she states that she continues to have right fingertip tingling, right facial tingling, and still some drifting to the right when walking.  Patient admits that she has not been taking her aspirin.  She states she has been taking her Crestor every day, however, over the past week she has not been taking her Crestor as she ran out and has not refilled it.  She has however, been taking her blood pressure medications daily.  She does not know her blood pressure on average as her blood pressure cuff broke and she has not bought a new one.   ED course-MRI brain, labs   Chart review-patient does have a history of left breast cancer-invasive ductal carcinoma.  She underwent left breast lumpectomy on 09/24/2015, radiation on 01/2016 which has been completed and currently on anastrozole which she has not missed any doses  LKW: 4 AM on 11/12/2019 tpa given?: no, out of the window Premorbid modified Rankin  scale (mRS): 0 NIH stroke score: 1 - right heel-to-shin dysmetria   Past Medical History:  Diagnosis Date  . Breast cancer ([place]) 07/09/15   left breast,  lower-inner quadrant   . Coronary artery disease    cardiologist-  dr Donnetta Hutching [facility], [facility] cardiology in [facility])---  hx PCI w/ stents to OM and LAD 02-08-2008  . Hyperlipidemia   . Hypertension   . PMB (postmenopausal bleeding)   . S/P coronary artery stent placement     Family History  Problem Relation Age of Onset  . Heart disease Mother   . Stroke Mother   . Colon cancer Mother 64  . Heart disease Father    Social History:   reports that she has never smoked. She has never used smokeless tobacco. She reports current alcohol use. She reports that she does not use drugs.  Medications  Current Facility-Administered Medications:  .  [COMPLETED] sodium chloride 0.9 % bolus 500 mL, 500 mL, Intravenous, Once, Stopped at 11/12/19 1828 **FOLLOWED BY** 0.9 %  sodium chloride infusion, 100 mL/hr, Intravenous, Continuous, Carmin Muskrat, MD .  anastrozole (ARIMIDEX) tablet 1 mg, 1 mg, Oral, QHS, Lucky Cowboy, MD, 1 mg at 11/12/19 2301 .  aspirin EC tablet 81 mg, 81 mg, Oral, Daily, Lucky Cowboy, MD, 81 mg at 11/12/19 2030 .  atenolol (TENORMIN) tablet 50 mg, 50 mg, Oral, Daily, Posey Pronto, Michaele Offer, MD .  cholecalciferol (VITAMIN D) tablet 2,000  Units, 2,000 Units, Oral, Daily, Lucky Cowboy, MD, 2,000 Units at 11/12/19 2031 .  labetalol (NORMODYNE) injection 5 mg, 5 mg, Intravenous, Q4H PRN, Lucky Cowboy, MD .  rosuvastatin (CRESTOR) tablet 20 mg, 20 mg, Oral, Daily, Posey Pronto, Michaele Offer, MD .  vitamin B-12 (CYANOCOBALAMIN) tablet 500 mcg, 500 mcg, Oral, Daily, Lucky Cowboy, MD, 500 mcg at 11/12/19 2031  Current Outpatient Medications:  .  anastrozole (ARIMIDEX) 1 MG tablet, Take 1 tablet (1 mg total) by mouth daily. (Patient taking differently: Take 1 mg by mouth at bedtime. ), Disp: 90 tablet, Rfl: 4 .   Ascorbic Acid (VITAMIN C GUMMIE PO), Take 2,000 mg by mouth daily., Disp: , Rfl:  .  aspirin 81 MG tablet, Take 81 mg by mouth daily., Disp: , Rfl:  .  atenolol (TENORMIN) 100 MG tablet, Take 50 mg by mouth at bedtime. , Disp: , Rfl: 5 .  Cholecalciferol (VITAMIN D3) 2000 UNITS TABS, Take 1 tablet by mouth daily., Disp: , Rfl:  .  nitroGLYCERIN (NITROSTAT) 0.4 MG SL tablet, Place 0.4 mg under the tongue every 5 (five) minutes as needed for chest pain. , Disp: , Rfl:  .  rosuvastatin (CRESTOR) 10 MG tablet, Take 1 tablet (10 mg total) by mouth every evening. (Patient taking differently: Take 10 mg by mouth at bedtime. ), Disp: 90 tablet, Rfl: 3 .  vitamin B-12 (CYANOCOBALAMIN) 500 MCG tablet, Take 1 tablet (500 mcg total) by mouth daily., Disp:  , Rfl:    Exam: Current vital signs: BP (!) 162/66   Pulse 69   Temp 98.6 F (37 C) (Oral)   Resp 18   Ht 5' 8.5" (1.74 m)   Wt 91.2 kg   SpO2 96%   BMI 30.12 kg/m  Vital signs in last 24 hours: Temp:  [98.6 F (37 C)] 98.6 F (37 C) (12/14 0939) Pulse Rate:  [58-95] 69 (12/15 0800) Resp:  [12-23] 18 (12/15 0800) BP: (159-208)/(66-99) 162/66 (12/15 0800) SpO2:  [96 %-100 %] 96 % (12/15 0800) Weight:  [91.2 kg] 91.2 kg (12/14 0939)  ROS:  General ROS: negative for - chills, fatigue, fever, night sweats, weight gain or weight loss Psychological ROS: negative for - behavioral disorder, hallucinations, memory difficulties, mood swings or suicidal ideation Ophthalmic ROS: negative for - blurry vision, double vision, eye pain or loss of vision ENT ROS: negative for - epistaxis, nasal discharge, oral lesions, sore throat, tinnitus or vertigo Respiratory ROS: negative for - cough, hemoptysis, shortness of breath or wheezing Cardiovascular ROS: negative for - chest pain, dyspnea on exertion, edema or irregular heartbeat Gastrointestinal ROS: negative for - abdominal pain, diarrhea, hematemesis, nausea/vomiting or stool  incontinence Genito-Urinary ROS: negative for - dysuria, hematuria, incontinence or urinary frequency/urgency Musculoskeletal ROS: negative for - joint swelling or muscular weakness Neurological ROS: as noted in HPI Dermatological ROS: negative for rash and skin lesion changes   Physical Exam   Constitutional: Appears well-developed and well-nourished.  Psych: Affect appropriate to situation Eyes: No scleral injection HENT: No OP obstrucion Head: Normocephalic.  Cardiovascular: Normal rate and regular rhythm.  Respiratory: Effort normal, non-labored breathing GI: Soft.  No distension.  Skin: WDI  Neuro: Mental Status: Patient is awake, alert, oriented to person, place, month, year, and situation. Patient is able to give a clear and coherent history. No signs of aphasia or neglect Cranial Nerves: II: Visual Fields are full.  III,IV, VI: EOMI without ptosis or diploplia. Pupils equal, round and reactive to light V:  Facial sensation is objectively equal and symmetrical however subjectively feels as though there is tingling on the right side of her face VII: Facial movement is symmetric.  VIII: hearing is intact to voice X: Palat elevates symmetrically XI: Shoulder shrug is symmetric. XII: tongue is midline without atrophy or fasciculations.  Motor: Tone is normal. Bulk is normal. 5/5 strength was present in all four extremities.  Sensory: Sensation is symmetric to light touch and temperature in the arms and legs. Deep Tendon Reflexes: 2+ and symmetric in the biceps and patellae.  Plantars: Toes are downgoing bilaterally.  Cerebellar: FNF shows no ataxia, HKS shows dysmetria with right heel over left shin  Labs I have reviewed labs in epic and the results pertinent to this consultation are:   CBC    Component Value Date/Time   WBC 7.0 11/12/2019 1431   RBC 4.96 11/12/2019 1431   HGB 13.6 11/12/2019 1749   HGB 13.6 11/15/2017 0840   HCT 40.0 11/12/2019 1749   HCT  41.0 11/15/2017 0840   PLT 232 11/12/2019 1431   PLT 197 11/15/2017 0840   MCV 84.1 11/12/2019 1431   MCV 84.1 11/15/2017 0840   MCH 28.4 11/12/2019 1431   MCHC 33.8 11/12/2019 1431   RDW 12.7 11/12/2019 1431   RDW 13.3 11/15/2017 0840   LYMPHSABS 1.7 11/12/2019 1431   LYMPHSABS 1.0 11/15/2017 0840   MONOABS 0.6 11/12/2019 1431   MONOABS 0.3 11/15/2017 0840   EOSABS 0.1 11/12/2019 1431   EOSABS 0.1 11/15/2017 0840   BASOSABS 0.1 11/12/2019 1431   BASOSABS 0.0 11/15/2017 0840    CMP     Component Value Date/Time   NA 138 11/12/2019 1749   NA 142 11/15/2017 0840   K 3.8 11/12/2019 1749   K 3.8 11/15/2017 0840   CL 106 11/12/2019 1749   CO2 23 11/12/2019 1431   CO2 24 11/15/2017 0840   GLUCOSE 190 (H) 11/12/2019 1749   GLUCOSE 140 11/15/2017 0840   BUN 11 11/12/2019 1749   BUN 16.1 11/15/2017 0840   CREATININE 1.20 (H) 11/12/2019 1749   CREATININE 1.4 (H) 11/15/2017 0840   CALCIUM 10.1 11/12/2019 1431   CALCIUM 9.5 11/15/2017 0840   PROT 8.2 (H) 11/12/2019 1431   PROT 7.6 11/15/2017 0840   ALBUMIN 4.4 11/12/2019 1431   ALBUMIN 3.8 11/15/2017 0840   AST 29 11/12/2019 1431   AST 14 11/15/2017 0840   ALT 48 (H) 11/12/2019 1431   ALT 16 11/15/2017 0840   ALKPHOS 68 11/12/2019 1431   ALKPHOS 64 11/15/2017 0840   BILITOT 0.6 11/12/2019 1431   BILITOT 0.47 11/15/2017 0840   GFRNONAA 42 (L) 11/12/2019 1431   GFRAA 48 (L) 11/12/2019 1431    Lipid Panel     Component Value Date/Time   CHOL 297 (H) 11/13/2019 0601   TRIG 264 (H) 11/13/2019 0601   HDL 42 11/13/2019 0601   CHOLHDL 7.1 11/13/2019 0601   VLDL 53 (H) 11/13/2019 0601   LDLCALC 202 (H) 11/13/2019 0601   HbA1c-9.7 LDL-202  Imaging I have reviewed the images obtained:   MRI examination of the brain-acute infarct in the posterior external capsule on the left.  Mild chronic ischemia  Etta Quill PA-C [facility] [phone]  M-F  (9:00 am- 5:00 PM)  11/13/2019, 8:38 AM      Assessment:  1.this is a 63 year old female presenting to the hospital with symptoms of right fingertip tingling, subjective right face tingling, gait abnormality.  Patient presented to the hospital  on 11/12/2019 and MRI showed acute infarct in the posterior external capsule on the left.    2. 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   You can look them up on [email or web address]  Password [email or web address]  NEUROHOSPITALIST ADDENDUM Performed a face to face diagnostic evaluation.   I have reviewed the contents of history and physical exam as documented by PA/ARNP/Resident and agree with above documentation.  I have discussed and formulated the above plan as documented. Edits to the note have been made as needed.  Patient has left posterior external capsule infarct, secondary to small vessel disease.  Uncontrolled risk factors include hypertension, diabetes which is likely new diagnosis, uncontrolled hyperlipidemia with LDL of 202. CT angiogram shows intracranial atherosclerotic disease however would not classify symptomatic iCAD as this is a small vessel infarct territory.  Echocardiogram completed, normal left atrial size.  Normal EF.  No intra-atrial shunt  Dual antiplatelets for 3 weeks and then aspirin  alone Patient's 24 hours after stroke, BP goal should be normotension. Diabetes management Increase statin from Crestor 10 to 20 mg, does not tolerate atorvastatin.   Outpatient neurology follow-up in stroke clinic at [facility] with Dr. Leonie Man in 4 to 6 weeks.  Contact stroke team if there are any questions.    Karena Addison Daeja Helderman MD [facility] [phone]   If 7pm to 7am, please call on call as listed on AMION.

## 2019-11-13 NOTE — Progress Notes (Signed)
PROGRESS NOTE  Audrey Fischer [id number] DOB: August 13, 1956 DOA: 11/12/2019 PCP: Hoyt Koch, MD  Hospital Course/Subjective: Audrey Fischer is a 63 y.o. female with medical history significant of breast cancer s/p radiation and surgery in remission for about 4 years now, CAD s/p PCI, hypertension, hyperlipidemia brought to the ED by her son for dizziness and imbalance.  Apparently symptoms started 12/13 around 4 AM, patient was unable to coordinate her mouse when she was using the computer.  She was seen by neurology this morning at [facility] emergency department, she has complaints of right hand numbness and tingling, as well as some gait disturbance, MRI showed acute infarct in the posterior external capsule on the left.  At neurology recommendation, she was transferred to [facility] this morning.  Assessment/Plan: Acute CVA-she is outside the window of permissive hypertension -Continue inpatient care -Patient was unable to tolerate complete MRI of the brain, she thinks she will not be able to tolerate MR angio even with Ativan -As such, have ordered CTA of the head and neck -Echo -PT/OT/SLP consultations  Hypertension Elevated, will hold off on antihypertensives at this time to maintain permissive hypertension Takes atenolol 50 mg daily at home, restarted this morning  CAD s/p stent Continue aspirin, statin  Hyperlipidemia On rosuvastatin at home We will switch to atorvastatin 80 mg for now  History of breast cancer s/p radiation and surgery In remission for 4 years now  DVT prophylaxis: SCD/Compression stockings Code Status: Full code discussed with patient during this hospital stay Family Communication: Discussed care with patient and daughter at bedside Disposition Plan:  Likely discharge home when medically ready. Admission status: Inpatient   Objective: Vitals:   11/13/19 0730 11/13/19 0800 11/13/19 0900 11/13/19 1039  BP: (!) 189/89 (!) 162/66 (!)  157/81 (!) (P) 186/96  Pulse: 63 69 67 (P) 65  Resp: 20 18 18  (P) 18  Temp:    (P) 98.6 F (37 C)  TempSrc:    (P) Oral  SpO2: 100% 96% 95%   Weight:      Height:        Intake/Output Summary (Last 24 hours) at 11/13/2019 1239 Last data filed at 11/12/2019 1828 Gross per 24 hour  Intake 500 ml  Output --  Net 500 ml   Filed Weights   11/12/19 0939  Weight: 91.2 kg     Exam: General:  Alert, oriented, calm, in no acute distress, speech clear Eyes: EOMI, clear sclerea Neck: supple, no masses, trachea mildline  Cardiovascular: RRR, no murmurs or rubs, no peripheral edema  Respiratory: clear to auscultation bilaterally, no wheezes, no crackles  Abdomen: soft, nontender, nondistended, normal bowel tones heard  Skin: dry, no rashes  Musculoskeletal: no joint effusions, normal range of motion  Psychiatric: appropriate affect, normal speech  Neurologic: extraocular muscles intact, clear speech, moving all extremities with intact sensorium, subjective numbness R hand   Data Reviewed: CBC: Recent Labs  Lab 11/12/19 1431 11/12/19 1749  WBC 7.0  --   NEUTROABS 4.7  --   HGB 14.1 13.6  HCT 41.7 40.0  MCV 84.1  --   PLT 232  --    Basic Metabolic Panel: Recent Labs  Lab 11/12/19 1431 11/12/19 1749  NA 140 138  K 4.0 3.8  CL 103 106  CO2 23  --   GLUCOSE 217* 190*  BUN 13 11  CREATININE 1.37* 1.20*  CALCIUM 10.1  --    GFR: Estimated Creatinine Clearance: 58.7 mL/min (A) (  by C-G formula based on SCr of 1.2 mg/dL (H)). Liver Function Tests: Recent Labs  Lab 11/12/19 1431  AST 29  ALT 48*  ALKPHOS 68  BILITOT 0.6  PROT 8.2*  ALBUMIN 4.4   No results for input(s): LIPASE, AMYLASE in the last 168 hours. No results for input(s): AMMONIA in the last 168 hours. Coagulation Profile: Recent Labs  Lab 11/12/19 1737  INR 0.9   Cardiac Enzymes: No results for input(s): CKTOTAL, CKMB, CKMBINDEX, TROPONINI in the last 168 hours. BNP (last 3 results) No  results for input(s): PROBNP in the last 8760 hours. HbA1C: Recent Labs    11/13/19 0601  HGBA1C 9.7*   CBG: No results for input(s): GLUCAP in the last 168 hours. Lipid Profile: Recent Labs    11/13/19 0601  CHOL 297*  HDL 42  LDLCALC 202*  TRIG 264*  CHOLHDL 7.1   Thyroid Function Tests: No results for input(s): TSH, T4TOTAL, FREET4, T3FREE, THYROIDAB in the last 72 hours. Anemia Panel: No results for input(s): VITAMINB12, FOLATE, FERRITIN, TIBC, IRON, RETICCTPCT in the last 72 hours. Urine analysis:    Component Value Date/Time   COLORURINE YELLOW 11/12/2019 1737   APPEARANCEUR CLOUDY (A) 11/12/2019 1737   LABSPEC 1.018 11/12/2019 1737   PHURINE 5.0 11/12/2019 1737   GLUCOSEU NEGATIVE 11/12/2019 1737   HGBUR NEGATIVE 11/12/2019 1737   BILIRUBINUR NEGATIVE 11/12/2019 1737   BILIRUBINUR Neg 08/11/2017 1315   KETONESUR NEGATIVE 11/12/2019 1737   PROTEINUR NEGATIVE 11/12/2019 1737   UROBILINOGEN 0.2 08/11/2017 1315   NITRITE NEGATIVE 11/12/2019 1737   LEUKOCYTESUR SMALL (A) 11/12/2019 1737   Sepsis Labs: @LABRCNTIP (procalcitonin:4,lacticidven:4)  ) Recent Results (from the past 240 hour(s))  SARS CORONAVIRUS 2 (TAT 6-24 HRS) Nasopharyngeal Urine, Clean Catch     Status: None   Collection Time: 11/12/19  5:37 PM   Specimen: Urine, Clean Catch; Nasopharyngeal  Result Value Ref Range Status   SARS Coronavirus 2 NEGATIVE NEGATIVE Final    Comment: (NOTE) SARS-CoV-2 target nucleic acids are NOT DETECTED. The SARS-CoV-2 RNA is generally detectable in upper and lower respiratory specimens during the acute phase of infection. Negative results do not preclude SARS-CoV-2 infection, do not rule out co-infections with other pathogens, and should not be used as the sole basis for treatment or other patient management decisions. Negative results must be combined with clinical observations, patient history, and epidemiological information. The expected result is  Negative. Fact Sheet for Patients: SugarRoll.be Fact Sheet for Healthcare Providers: https://www.woods-mathews.com/ This test is not yet approved or cleared by the [place] FDA and  has been authorized for detection and/or diagnosis of SARS-CoV-2 by FDA under an Emergency Use Authorization (EUA). This EUA will remain  in effect (meaning this test can be used) for the duration of the COVID-19 declaration under Section 56 4(b)(1) of the Act, 21 U.S.C. section 360bbb-3(b)(1), unless the authorization is terminated or revoked sooner. Performed at [facility], [place] [address] [place], [place] [address]      Studies: MR Brain Wo Contrast (neuro protocol)  Result Date: 11/12/2019 CLINICAL DATA:  Right upper extremity weakness.  Rule out stroke EXAM: MRI HEAD WITHOUT CONTRAST TECHNIQUE: Multiplanar, multiecho pulse sequences of the brain and surrounding structures were obtained without intravenous contrast. COMPARISON:  None. FINDINGS: Brain: Acute infarct in the posterior external capsule on the left. No other acute infarct. Chronic infarct in the left corona radiata. Small chronic infarcts in the cerebellum bilaterally. Brainstem normal. Ventricle size normal. No mass or edema.  Incomplete study.  The patient refused to complete the study. Vascular: Normal arterial flow voids. Skull and upper cervical spine: Negative Sinuses/Orbits: Negative Other: None IMPRESSION: Acute infarct in the posterior external capsule on the left. Mild chronic ischemia. The patient refused to complete the study. Electronically Signed   By: Franchot Gallo M.D.   On: 11/12/2019 16:35    Scheduled Meds: . anastrozole  1 mg Oral QHS  . aspirin EC  81 mg Oral Daily  . atenolol  50 mg Oral Daily  . cholecalciferol  2,000 Units Oral Daily  . clopidogrel  300 mg Oral Once  . [START ON 11/14/2019] clopidogrel  75 mg Oral Daily  . rosuvastatin  20 mg Oral Daily  . vitamin  B-12  500 mcg Oral Daily    Continuous Infusions: . sodium chloride       LOS: 1 day   Time spent: 21 minutes  Letishia Elliott Marry Guan, MD [facility] Pager [phone]  If 7PM-7AM, please contact night-coverage [email or web address] Password [facility] 11/13/2019, 12:39 PM

## 2019-11-13 NOTE — ED Notes (Addendum)
[facility] called for transport to [facility] 3W. Transport papers at [facility]. Report given to [place], Therapist, sports.

## 2019-11-13 NOTE — Progress Notes (Signed)
  Echocardiogram 2D Echocardiogram has been performed.  Audrey Fischer 11/13/2019, 2:59 PM

## 2019-11-14 DIAGNOSIS — E119 Type 2 diabetes mellitus without complications: Secondary | ICD-10-CM

## 2019-11-14 DIAGNOSIS — I63032 Cerebral infarction due to thrombosis of left carotid artery: Secondary | ICD-10-CM

## 2019-11-14 DIAGNOSIS — R739 Hyperglycemia, unspecified: Secondary | ICD-10-CM

## 2019-11-14 DIAGNOSIS — E1159 Type 2 diabetes mellitus with other circulatory complications: Secondary | ICD-10-CM

## 2019-11-14 DIAGNOSIS — I10 Essential (primary) hypertension: Secondary | ICD-10-CM

## 2019-11-14 DIAGNOSIS — E782 Mixed hyperlipidemia: Secondary | ICD-10-CM

## 2019-11-14 LAB — RAPID URINE DRUG SCREEN, HOSP PERFORMED
Amphetamines: NOT DETECTED
Barbiturates: NOT DETECTED
Benzodiazepines: NOT DETECTED
Cocaine: NOT DETECTED
Opiates: NOT DETECTED
Tetrahydrocannabinol: NOT DETECTED

## 2019-11-14 MED ORDER — ROSUVASTATIN CALCIUM 40 MG PO TABS: 40.0000 mg | ORAL_TABLET | Freq: Every day | ORAL | 0 refills | Status: DC

## 2019-11-14 MED ORDER — CLOPIDOGREL BISULFATE 75 MG PO TABS: 75.0000 mg | ORAL_TABLET | Freq: Every day | ORAL | 0 refills | Status: AC

## 2019-11-14 NOTE — Progress Notes (Signed)
Pt discharged home per MD orders. AVS documentation went over completely and all questions answered. IV discontinued with no bleeding noted. All belongings returned and patient rolled out to private vehicle via wheelchair.  Janett Billow, RN

## 2019-11-14 NOTE — TOC Transition Note (Signed)
Transition of Care [facility]) - CM/SW Discharge Note   Patient Details  Name: Audrey Fischer MRN: [id number] Date of Birth: October 10, 1956  [facility] CM/SW Contact:  Pollie Friar, RN Phone Number: 11/14/2019, 11:21 AM   Clinical Narrative:    Pt discharging home with outpatient therapy. Pt chose to attend Outpatient therapy on [facility]. Orders in [place] and information on the AVS.  Pt states she has all needed DME at home.  Pt has supervision at home with her adult son.  Pt has transportation home.    Final next level of care: OP Rehab Barriers to Discharge: No Barriers Identified   Patient Goals and CMS Choice     Choice offered to / list presented to : Patient  Discharge Placement                       Discharge Plan and Services                                     Social Determinants of Health (SDOH) Interventions     Readmission Risk Interventions No flowsheet data found.

## 2019-11-14 NOTE — Evaluation (Addendum)
Physical Therapy Evaluation Patient Details Name: Audrey Fischer MRN: [id number] DOB: 31-Jan-1956 Today's Date: 11/14/2019   History of Present Illness  Audrey Fischer is a 63 y.o. female with history status post coronary artery stent placement, postmenopausal bleeding, hypertension, hyperlipidemia, CAD and breast cancer. Presents with rigth facial and fingertips tingling and drifting to the right. 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   Clinical Impression  Pt admitted with above. Prior to admission, pt lives with her son and is a [employer] (works at the [employer]). On PT evaluation, pt reports continued right fingertip and facial numbness. Presents with dynamic balance impairments and gait abnormalities. Ambulating 550 feet with no assistive device; progressing to supervision level and responds very well to gait training. Would benefit from OPPT to continue to address deficits and promote neuro recovery.    Follow Up Recommendations Outpatient PT    Equipment Recommendations  None recommended by PT    Recommendations for Other Services       Precautions / Restrictions Precautions Precautions: Fall Restrictions Weight Bearing Restrictions: No      Mobility  Bed Mobility Overal bed mobility: Independent                Transfers Overall transfer level: Needs assistance Equipment used: None Transfers: Sit to/from Stand Sit to Stand: Supervision            Ambulation/Gait Ambulation/Gait assistance: Supervision;Min guard Gait Distance (Feet): 550 Feet Assistive device: None Gait Pattern/deviations: Step-through pattern;Decreased dorsiflexion - right Gait velocity: decreased   General Gait Details: Cues for decreased right foot external rotation, heel strike at initial contact, reciprocal arm swing, increased gait speed. Pt responding well to cues to correct  Stairs            Wheelchair  Mobility    Modified Rankin (Stroke Patients Only) Modified Rankin (Stroke Patients Only) Pre-Morbid Rankin Score: No symptoms Modified Rankin: Moderately severe disability     Balance Overall balance assessment: Mild deficits observed, not formally tested                                           Pertinent Vitals/Pain Pain Assessment: No/denies pain    Home Living Family/patient expects to be discharged to:: Private residence Living Arrangements: Children(son) Available Help at Discharge: Family Type of Home: House Home Access: Stairs to enter   Technical brewer of Steps: 5 Home Layout: Able to live on main level with bedroom/bathroom Home Equipment: Walker - 2 wheels;Bedside commode;Other (comment)(reacher)      Prior Function Level of Independence: Independent         Comments: Works as a Careers adviser at the [employer]   Dominant Hand: Right    Extremity/Trunk Assessment   Upper Extremity Assessment Upper Extremity Assessment: Defer to OT evaluation    Lower Extremity Assessment Lower Extremity Assessment: RLE deficits/detail;LLE deficits/detail RLE Deficits / Details: Strength 5/5 LLE Deficits / Details: Strength 5/5       Communication   Communication: No difficulties  Cognition Arousal/Alertness: Awake/alert Behavior During Therapy: WFL for tasks assessed/performed Overall Cognitive Status: Within Functional Limits for tasks assessed  General Comments: Good insight into deficits      General Comments      Exercises     Assessment/Plan    PT Assessment Patient needs continued PT services  PT Problem List Decreased balance;Decreased mobility;Decreased coordination       PT Treatment Interventions Gait training;Stair training;Therapeutic activities;Functional mobility training;Therapeutic exercise;Balance training;Patient/family education     PT Goals (Current goals can be found in the Care Plan section)  Acute Rehab PT Goals 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." PT Goal Formulation: With patient Time For Goal Achievement: 11/28/19 Potential to Achieve Goals: Good    Frequency Min 4X/week   Barriers to discharge        Co-evaluation               AM-PAC PT "6 Clicks" Mobility  Outcome Measure Help needed turning from your back to your side while in a flat bed without using bedrails?: None Help needed moving from lying on your back to sitting on the side of a flat bed without using bedrails?: None Help needed moving to and from a bed to a chair (including a wheelchair)?: None Help needed standing up from a chair using your arms (e.g., wheelchair or bedside chair)?: None Help needed to walk in hospital room?: A Little Help needed climbing 3-5 steps with a railing? : A Little 6 Click Score: 22    End of Session Equipment Utilized During Treatment: Gait belt Activity Tolerance: Patient tolerated treatment well Patient left: in chair;with call bell/phone within reach;with chair alarm set Nurse Communication: Mobility status PT Visit Diagnosis: Unsteadiness on feet (R26.81);Other abnormalities of gait and mobility (R26.89);Other symptoms and signs involving the nervous system (R29.898)    Time: 0900-0930 PT Time Calculation (min) (ACUTE ONLY): 30 min   Charges:   PT Evaluation $PT Eval Moderate Complexity: 1 Mod PT Treatments $Gait Training: 8-22 mins        Audrey Fischer, PT, DPT Acute Rehabilitation Services Pager [phone] Office [phone]   Willy Eddy 11/14/2019, 9:43 AM

## 2019-11-14 NOTE — Progress Notes (Signed)
STROKE TEAM PROGRESS NOTE   SUBJECTIVE (INTERVAL HISTORY) Pt sitting in chair, no family is at the bedside.  Overall her condition is gradually improving. She stated that she still has some clumsiness at left hand and left knee, but worked good with PT/OT and they recommend outpt PT/OT.    OBJECTIVE Temp:  [97.8 F (36.6 C)-98.5 F (36.9 C)] 98.2 F (36.8 C) (12/16 0759) Pulse Rate:  [59-80] 66 (12/16 0759) Cardiac Rhythm: Normal sinus rhythm (12/15 1911) Resp:  [16-18] 16 (12/16 0406) BP: (141-177)/(67-84) 177/84 (12/16 0759) SpO2:  [97 %-100 %] 100 % (12/16 0759)  No results for input(s): GLUCAP in the last 168 hours. Recent Labs  Lab 11/12/19 1431 11/12/19 1749  NA 140 138  K 4.0 3.8  CL 103 106  CO2 23  --   GLUCOSE 217* 190*  BUN 13 11  CREATININE 1.37* 1.20*  CALCIUM 10.1  --    Recent Labs  Lab 11/12/19 1431  AST 29  ALT 48*  ALKPHOS 68  BILITOT 0.6  PROT 8.2*  ALBUMIN 4.4   Recent Labs  Lab 11/12/19 1431 11/12/19 1749  WBC 7.0  --   NEUTROABS 4.7  --   HGB 14.1 13.6  HCT 41.7 40.0  MCV 84.1  --   PLT 232  --    No results for input(s): CKTOTAL, CKMB, CKMBINDEX, TROPONINI in the last 168 hours. Recent Labs    11/12/19 1737  LABPROT 12.2  INR 0.9   Recent Labs    11/12/19 1737  COLORURINE YELLOW  LABSPEC 1.018  PHURINE 5.0  GLUCOSEU NEGATIVE  HGBUR NEGATIVE  BILIRUBINUR NEGATIVE  KETONESUR NEGATIVE  PROTEINUR NEGATIVE  NITRITE NEGATIVE  LEUKOCYTESUR SMALL*       Component Value Date/Time   CHOL 297 (H) 11/13/2019 0601   TRIG 264 (H) 11/13/2019 0601   HDL 42 11/13/2019 0601   CHOLHDL 7.1 11/13/2019 0601   VLDL 53 (H) 11/13/2019 0601   LDLCALC 202 (H) 11/13/2019 0601   Lab Results  Component Value Date   HGBA1C 9.7 (H) 11/13/2019      Component Value Date/Time   LABOPIA NONE DETECTED 11/14/2019 0003   COCAINSCRNUR NONE DETECTED 11/14/2019 0003   LABBENZ NONE DETECTED 11/14/2019 0003   AMPHETMU NONE DETECTED 11/14/2019 0003    THCU NONE DETECTED 11/14/2019 0003   LABBARB NONE DETECTED 11/14/2019 0003    No results for input(s): ETH in the last 168 hours.  I have personally reviewed the radiological images below and agree with the radiology interpretations.  CT ANGIO HEAD W OR WO CONTRAST  Result Date: 11/13/2019 CLINICAL DATA:  Stroke, follow-up EXAM: CT ANGIOGRAPHY HEAD AND NECK TECHNIQUE: Multidetector CT imaging of the head and neck was performed using the standard protocol during bolus administration of intravenous contrast. Multiplanar CT image reconstructions and MIPs were obtained to evaluate the vascular anatomy. Carotid stenosis measurements (when applicable) are obtained utilizing NASCET criteria, using the distal internal carotid diameter as the denominator. CONTRAST:  187mL OMNIPAQUE IOHEXOL 350 MG/ML SOLN COMPARISON:  MRI 11/12/2019 FINDINGS: CT HEAD FINDINGS Brain: Small area of ill-defined hypoattenuation is present involving the left basal ganglia and adjacent white matter corresponding to infarction on MRI. There is no acute intracranial hemorrhage. 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 There is a hyperdense, partially calcified mass along the right anterior clinoid process. Vascular: Intracranial atherosclerotic calcification at the skull base. Skull: Unremarkable. Sinuses: Mild patchy mucosal thickening. Orbits: Unremarkable. Review of the MIP images confirms the above findings CTA  NECK FINDINGS Aortic arch: Great vessel origins are patent. 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 Right carotid system: Common, internal, and external carotid arteries are patent. There is mild noncalcified plaque along the common carotid. Minimal plaque is present at the ICA origin without measurable stenosis. Left carotid system: Common, internal, and external carotid arteries are patent. There is no measurable stenosis at the ICA origin. Vertebral arteries: Patent.  Right vertebral  artery is dominant. Skeleton: Unremarkable. Other neck: No neck mass or adenopathy. Upper chest: No apical lung mass. Review of the MIP images confirms the above findings CTA HEAD FINDINGS Anterior circulation: Intracranial internal carotid arteries are patent with calcified plaque along the cavernous and paraclinoid portions causing mild stenosis. Anterior and middle cerebral arteries are patent. There is moderate to severe stenosis of the proximal right A1 ACA. There is likely atherosclerotic irregularity of the A2 and more distal ACA branches. There is moderate stenosis of the right M1 MCA. There is likely atherosclerotic irregularity of the M2 and more distal MCA branches. Stenosis ranges from mild to moderate. Posterior circulation: Intracranial vertebral arteries are patent. Left vertebral artery becomes diminutive after origin of a PICA. Basilar artery is patent. Posterior cerebral arteries are patent. A right posterior communicating artery is present. Possible diminutive left posterior communicating artery. There is atherosclerotic irregularity of the P2 and more distal PCA branches. Stenosis primarily ranges from mild to moderate. There is moderate to severe stenosis of the proximal P2 PCA segments bilaterally. Venous sinuses: As permitted by contrast timing, patent. Review of the MIP images confirms the above findings IMPRESSION: Evolving infarction of the left basal ganglia and adjacent white matter. No acute intracranial hemorrhage. No large vessel occlusion. No hemodynamically significant stenosis in the neck. Multifocal intracranial atherosclerosis. 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 Postcontrast MR imaging through the orbits is recommended. Electronically Signed   By: Macy Mis M.D.   On: 11/13/2019 14:51   CT ANGIO NECK W OR WO CONTRAST  Result Date: 11/13/2019 CLINICAL DATA:  Stroke, follow-up EXAM: CT ANGIOGRAPHY HEAD AND NECK TECHNIQUE: Multidetector CT  imaging of the head and neck was performed using the standard protocol during bolus administration of intravenous contrast. Multiplanar CT image reconstructions and MIPs were obtained to evaluate the vascular anatomy. Carotid stenosis measurements (when applicable) are obtained utilizing NASCET criteria, using the distal internal carotid diameter as the denominator. CONTRAST:  176mL OMNIPAQUE IOHEXOL 350 MG/ML SOLN COMPARISON:  MRI 11/12/2019 FINDINGS: CT HEAD FINDINGS Brain: Small area of ill-defined hypoattenuation is present involving the left basal ganglia and adjacent white matter corresponding to infarction on MRI. There is no acute intracranial hemorrhage. 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 There is a hyperdense, partially calcified mass along the right anterior clinoid process. Vascular: Intracranial atherosclerotic calcification at the skull base. Skull: Unremarkable. Sinuses: Mild patchy mucosal thickening. Orbits: Unremarkable. Review of the MIP images confirms the above findings CTA NECK FINDINGS Aortic arch: Great vessel origins are patent. 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 Right carotid system: Common, internal, and external carotid arteries are patent. There is mild noncalcified plaque along the common carotid. Minimal plaque is present at the ICA origin without measurable stenosis. Left carotid system: Common, internal, and external carotid arteries are patent. There is no measurable stenosis at the ICA origin. Vertebral arteries: Patent.  Right vertebral artery is dominant. Skeleton: Unremarkable. Other neck: No neck mass or adenopathy. Upper chest: No apical lung mass.  Review of the MIP images confirms the above findings CTA HEAD FINDINGS Anterior circulation: Intracranial internal carotid arteries are patent with calcified plaque along the cavernous and paraclinoid portions causing mild stenosis. Anterior and middle cerebral arteries are patent.  There is moderate to severe stenosis of the proximal right A1 ACA. There is likely atherosclerotic irregularity of the A2 and more distal ACA branches. There is moderate stenosis of the right M1 MCA. There is likely atherosclerotic irregularity of the M2 and more distal MCA branches. Stenosis ranges from mild to moderate. Posterior circulation: Intracranial vertebral arteries are patent. Left vertebral artery becomes diminutive after origin of a PICA. Basilar artery is patent. Posterior cerebral arteries are patent. A right posterior communicating artery is present. Possible diminutive left posterior communicating artery. There is atherosclerotic irregularity of the P2 and more distal PCA branches. Stenosis primarily ranges from mild to moderate. There is moderate to severe stenosis of the proximal P2 PCA segments bilaterally. Venous sinuses: As permitted by contrast timing, patent. Review of the MIP images confirms the above findings IMPRESSION: Evolving infarction of the left basal ganglia and adjacent white matter. No acute intracranial hemorrhage. No large vessel occlusion. No hemodynamically significant stenosis in the neck. Multifocal intracranial atherosclerosis. 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 Postcontrast MR imaging through the orbits is recommended. Electronically Signed   By: Macy Mis M.D.   On: 11/13/2019 14:51   MR Brain Wo Contrast (neuro protocol)  Result Date: 11/12/2019 CLINICAL DATA:  Right upper extremity weakness.  Rule out stroke EXAM: MRI HEAD WITHOUT CONTRAST TECHNIQUE: Multiplanar, multiecho pulse sequences of the brain and surrounding structures were obtained without intravenous contrast. COMPARISON:  None. FINDINGS: Brain: Acute infarct in the posterior external capsule on the left. No other acute infarct. Chronic infarct in the left corona radiata. Small chronic infarcts in the cerebellum bilaterally. Brainstem normal. Ventricle size normal. No  mass or edema. Incomplete study.  The patient refused to complete the study. Vascular: Normal arterial flow voids. Skull and upper cervical spine: Negative Sinuses/Orbits: Negative Other: None IMPRESSION: Acute infarct in the posterior external capsule on the left. Mild chronic ischemia. The patient refused to complete the study. Electronically Signed   By: Franchot Gallo M.D.   On: 11/12/2019 16:35   ECHOCARDIOGRAM COMPLETE BUBBLE STUDY  Result Date: 11/13/2019   ECHOCARDIOGRAM REPORT   Patient Name:   Audrey Fischer Date of Exam: 11/13/2019 Medical Rec #:  [id number]    Height:       68.5 in Accession #:    [id number]   Weight:       201.0 lb Date of Birth:  09-Feb-1956    BSA:          2.06 m Patient Age:    63 years     BP:           186/96 mmHg Patient Gender: F            HR:           65 bpm. Exam Location:  Inpatient Procedure: 2D Echo, Cardiac Doppler, Color Doppler and Saline Contrast Bubble            Study Indications:    CVA  History:        Patient has no prior history of Echocardiogram examinations.                 CAD; Risk Factors:Hypertension and Dyslipidemia. Breast cancer,  Radiation therapy.  Sonographer:    Dustin Flock Referring Phys: [id number] MIR MOHAMMED Pease  1. Left ventricular ejection fraction, by visual estimation, is 60 to 65%. The left ventricle has normal function. There is no left ventricular hypertrophy.  2. Elevated left atrial pressure.  3. Left ventricular diastolic parameters are consistent with Grade II diastolic dysfunction (pseudonormalization).  4. The left ventricle has no regional wall motion abnormalities.  5. Global right ventricle has normal systolic function.The right ventricular size is normal. No increase in right ventricular wall thickness.  6. Left atrial size was normal.  7. Right atrial size was normal.  8. The mitral valve is normal in structure. Mild mitral valve regurgitation. No evidence of mitral stenosis.  9. The  tricuspid valve is normal in structure. Tricuspid valve regurgitation is mild. 10. The aortic valve is normal in structure. Aortic valve regurgitation is not visualized. No evidence of aortic valve sclerosis or stenosis. 11. The pulmonic valve was normal in structure. Pulmonic valve regurgitation is not visualized. 12. Moderately elevated pulmonary artery systolic pressure. 13. The inferior vena cava is normal in size with greater than 50% respiratory variability, suggesting right atrial pressure of 3 mmHg. FINDINGS  Left Ventricle: Left ventricular ejection fraction, by visual estimation, is 60 to 65%. The left ventricle has normal function. The left ventricle has no regional wall motion abnormalities. There is no left ventricular hypertrophy. Left ventricular diastolic parameters are consistent with Grade II diastolic dysfunction (pseudonormalization). Elevated left atrial pressure. Right Ventricle: The right ventricular size is normal. No increase in right ventricular wall thickness. Global RV systolic function is has normal systolic function. The tricuspid regurgitant velocity is 3.00 m/s, and with an assumed right atrial pressure  of 8 mmHg, the estimated right ventricular systolic pressure is moderately elevated at 44.0 mmHg. Left Atrium: Left atrial size was normal in size. Right Atrium: Right atrial size was normal in size Pericardium: There is no evidence of pericardial effusion. Mitral Valve: The mitral valve is normal in structure. Mild mitral valve regurgitation. No evidence of mitral valve stenosis by observation. Tricuspid Valve: The tricuspid valve is normal in structure. Tricuspid valve regurgitation is mild. Aortic Valve: The aortic valve is normal in structure. Aortic valve regurgitation is not visualized. The aortic valve is structurally normal, with no evidence of sclerosis or stenosis. Pulmonic Valve: The pulmonic valve was normal in structure. Pulmonic valve regurgitation is not visualized.  Pulmonic regurgitation is not visualized. Aorta: The aortic root, ascending aorta and aortic arch are all structurally normal, with no evidence of dilitation or obstruction. Venous: The inferior vena cava is normal in size with greater than 50% respiratory variability, suggesting right atrial pressure of 3 mmHg. IAS/Shunts: No atrial level shunt detected by color flow Doppler. Agitated saline contrast was given intravenously to evaluate for intracardiac shunting. Saline contrast bubble study was negative, with no evidence of any interatrial shunt. There is no evidence of a patent foramen ovale. No ventricular septal defect is seen or detected. There is no evidence of an atrial septal defect.  LEFT VENTRICLE PLAX 2D LVIDd:         4.10 cm  Diastology LVIDs:         2.80 cm  LV e' lateral:   7.40 cm/s LV PW:         1.10 cm  LV E/e' lateral: 13.2 LV IVS:        1.20 cm  LV e' medial:    4.46 cm/s LVOT diam:  1.90 cm  LV E/e' medial:  22.0 LV SV:         45 ml LV SV Index:   21.03 LVOT Area:     2.84 cm  RIGHT VENTRICLE RV Basal diam:  2.70 cm RV S prime:     14.00 cm/s TAPSE (M-mode): 2.9 cm LEFT ATRIUM             Index       RIGHT ATRIUM           Index LA diam:        2.90 cm 1.41 cm/m  RA Area:     10.80 cm LA Vol (A2C):   28.8 ml 13.99 ml/m RA Volume:   22.30 ml  10.83 ml/m LA Vol (A4C):   49.3 ml 23.94 ml/m LA Biplane Vol: 39.9 ml 19.38 ml/m  AORTIC VALVE LVOT Vmax:   109.00 cm/s LVOT Vmean:  75.100 cm/s LVOT VTI:    0.253 m  AORTA Ao Root diam: 2.50 cm MITRAL VALVE                        TRICUSPID VALVE MV Area (PHT): 2.95 cm             TR Peak grad:   36.0 mmHg MV PHT:        74.53 msec           TR Vmax:        308.00 cm/s MV Decel Time: 257 msec MV E velocity: 97.90 cm/s 103 cm/s  SHUNTS MV A velocity: 83.90 cm/s 70.3 cm/s Systemic VTI:  0.25 m MV E/A ratio:  1.17       1.5       Systemic Diam: 1.90 cm  Mihai Croitoru MD Electronically signed by Sanda Klein MD Signature Date/Time:  11/13/2019/3:45:07 PM    Final     PHYSICAL EXAM  Temp:  [97.8 F (36.6 C)-98.5 F (36.9 C)] 98.2 F (36.8 C) (12/16 0759) Pulse Rate:  [59-80] 66 (12/16 0759) Resp:  [16-18] 16 (12/16 0406) BP: (141-177)/(67-84) 177/84 (12/16 0759) SpO2:  [97 %-100 %] 100 % (12/16 0759)  General - Well nourished, well developed, in no apparent distress.  Ophthalmologic - fundi not visualized due to noncooperation.  Cardiovascular - Regular rhythm and rate.  Mental Status -  Level of arousal and orientation to time, place, and person were intact. Language including expression, naming, repetition, comprehension was assessed and found intact. Attention span and concentration were normal. Fund of Knowledge was assessed and was intact.  Cranial Nerves II - XII - II - Visual field intact OU. III, IV, VI - Extraocular movements intact. V - Facial sensation intact bilaterally. VII - Facial movement intact bilaterally. VIII - Hearing & vestibular intact bilaterally. X - Palate elevates symmetrically. XI - Chin turning & shoulder shrug intact bilaterally. XII - Tongue protrusion intact.  Motor Strength - The patient's strength was normal in all extremities and pronator drift was absent except right hand dexterity difficulty.  Bulk was normal and fasciculations were absent.   Motor Tone - Muscle tone was assessed at the neck and appendages and was normal.  Reflexes - The patient's reflexes were symmetrical in all extremities and she had no pathological reflexes.  Sensory - Light touch, temperature/pinprick were assessed and were symmetrical except right anterior hand decreased light touch sensation, about 80% of left.    Coordination - The patient had normal movements in the left  FTN with no ataxia or dysmetria. However, right FTN mild dysmetria proportional to the weakness. Tremor was absent.  Gait and Station - deferred.   ASSESSMENT/PLAN Ms. Audrey Fischer is a 63 y.o. female with history of  CAD s/p stent, HTN, HLD, breast Cancer in 2016 now on anastrozole admitted for right sided weakness and right hand numbness.   Stroke:  left AchA territory small lacunar infarct, likely secondary to small vessel disease source  Resultant right hand dexterity difficulty  MRI  Left PLIC and CR infarct  CTA head andn neck showed right M1, left M2 and M3 stenosis  2D Echo EF 60-65%  LDL 202  HgbA1c 9.7  SCDs for VTE prophylaxis  No antithrombotic prior to admission, now on aspirin 81 mg daily and clopidogrel 75 mg daily. Continue DAPT for 3 weeks and then ASA alsone   Patient counseled to be compliant with her antithrombotic medications  Ongoing aggressive stroke risk factor management  Therapy recommendations:  outpt PT/OT  Disposition:  home  Diabetes  HgbA1c 9.7 goal < 7.0  Uncontrolled  Close PCP follow up for better DM control  CBG monitoring  SSI  DM education  Hypertension . Stable on the high side . Permissive hypertension (OK if <220/120) for 24-48 hours post stroke and then gradually normalized within 3-5 days.  Long term BP goal normotensive  Hyperlipidemia  Home meds: none   LDL 202, goal < 70  Now on crestor 40  Continue statin at discharge  Other Stroke Risk Factors  ETOH use, recommend less than 1 drink per day if drinks  Obesity, Body mass index is 30.12 kg/m.   Coronary artery disease s/p stenting  Other Active Problems  Breast cancer 2016 s/p sx and rx, now on anastrozole   Hospital day # 2  Neurology will sign off. Please call with questions. Pt will follow up with stroke clinic NP at [facility] in about 4 weeks. Thanks for the consult.   Rosalin Hawking, MD PhD Stroke Neurology 11/14/2019 11:31 AM    To contact Stroke Continuity provider, please refer to [email or web address] After hours, contact General Neurology

## 2019-11-14 NOTE — Discharge Summary (Signed)
Physician Discharge Summary  Audrey Fischer [id number] DOB: [birth year] DOA: 11/12/2019  PCP: Hoyt Koch, MD  Admit date: 11/12/2019 Discharge date: 11/14/2019  Admitted From: Home Disposition:  Home  Recommendations for Outpatient Follow-up:  1. Follow up with PCP in 1-2 weeks 2. Follow-up with neurology, Dr. Leonie Man in 4 weeks 3. Please obtain BMP in one week 4. Started on dual antiplatelet therapy for acute CVA with Plavix 75 mg daily x 3 weeks 5. Continue aspirin 81 mg p.o. daily 6. Will need initiation of treatment for new diagnosis of diabetes mellitus, hemoglobin A1c 9.7, patient wishes to defer initiation of treatment to PCP 7. Will need further work-up of incidental finding of suspicion for meningioma right anterior clinoid process outpatient 8. Continue monitor blood pressure closely, may benefit from addition of ACE inhibitor for new diagnosis of diabetes mellitus, defer to PCP. 9. Please follow up on the following pending results:  Home Health: Outpatient physical therapy Equipment/Devices: None  Discharge Condition: Stable CODE STATUS: Full code Diet recommendation: Heart healthy, consistent carbohydrate diet  History of present illness:  Audrey Fischer a 63 y.o.femalewith medical history significant ofbreast cancer s/p radiation and surgery in remission for about 4 years now, CAD s/p PCI, hypertension, hyperlipidemia brought to the ED by her son for dizziness and imbalance.  Apparently symptoms started 12/13 around 4 AM, patient was unable to coordinate her mouse when she was using the computer.  She was seen by neurology this morning at [facility] emergency department, she has complaints of right hand numbness and tingling, as well as some gait disturbance, MRI showed acute infarct in the posterior external capsule on the left.  At neurology recommendation, she was transferred to [facility] this morning.  Hospital course:  Discharge Diagnoses:   Principal Problem:   CVA (cerebral vascular accident) ([place]) Active Problems:   HTN (hypertension)   Hyperlipidemia  Acute CVA Patient initially presented with dizziness and issues with balance.  MRI notable for acute infarct posterior external capsule on the left.  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  Neurology was consulted and followed during hospital course.  TTE with EF [id number], grade 2 diastolic dysfunction, mild MR/TR, normal IVC.  CTA head/neck with evolving infarction left basal ganglia, no large vessel occlusion, no significant stenosis but notable suspicion for meningioma right anterior clinoid process.  Lipid profile with triglycerides 264, total cholesterol 297, HDL 42, LDL 202.  Hemoglobin A1c 9.7.  PT recommended outpatient therapy.  Started on dual antiplatelet therapy with Plavix 75 mg p.o. daily x21 days per neurology recommendations in addition to aspirin 81 mg p.o. daily.  Patient's Crestor increased to 40 mg p.o. daily.  Will need follow-up with neurology, Dr. Leonie Man in 4 weeks following discharge.  Type 2 diabetes mellitus, new diagnosis Hemoglobin A1c 9.7.  Patient wishes to discuss plan of care with her PCP before initiating therapy.  Discussed with her need for consistent carbohydrate diet.  Hypertension Continue home atenolol 50 mg p.o. daily.  Recommend PCP follow-up blood pressure, may consider addition of ACE inhibitor with new diagnosis of type 2 diabetes mellitus.  CAD s/p stent Continue aspirin and increased dose statin.  Hyperlipidemia  Lipid profile with triglycerides 264, total cholesterol 297, HDL 42, LDL 202.  Crestor increased to 40 mg p.o. daily.  History of breast cancer s/p radiation and surgery In remission for 4 years now.  Continue anastrozole, outpatient follow-up.  Discharge Instructions  Discharge Instructions    Call MD for:  difficulty breathing,  headache or visual disturbances   Complete by: As directed    Call MD for:  extreme fatigue   Complete by: As directed    Call MD for:  persistant dizziness or light-headedness   Complete by: As directed    Call MD for:  persistant nausea and vomiting   Complete by: As directed    Call MD for:  temperature >100.4   Complete by: As directed    Diet - low sodium heart healthy   Complete by: As directed    Increase activity slowly   Complete by: As directed      Allergies as of 11/14/2019      Reactions   Capsule [gelatin] Hives   Any medication that is in gel capsule form   Capsaicin Other (See Comments)   Unknown rxn      Medication List    TAKE these medications   anastrozole 1 MG tablet Commonly known as: ARIMIDEX Take 1 tablet (1 mg total) by mouth daily. What changed: when to take this   aspirin 81 MG tablet Take 81 mg by mouth daily.   atenolol 100 MG tablet Commonly known as: TENORMIN Take 50 mg by mouth at bedtime.   clopidogrel 75 MG tablet Commonly known as: PLAVIX Take 1 tablet (75 mg total) by mouth daily for 21 days. Start taking on: November 15, 2019   nitroGLYCERIN 0.4 MG SL tablet Commonly known as: NITROSTAT Place 0.4 mg under the tongue every 5 (five) minutes as needed for chest pain.   rosuvastatin 40 MG tablet Commonly known as: CRESTOR Take 1 tablet (40 mg total) by mouth daily. What changed:   medication strength  how much to take  when to take this   vitamin B-12 500 MCG tablet Commonly known as: CYANOCOBALAMIN Take 1 tablet (500 mcg total) by mouth daily.   VITAMIN C GUMMIE PO Take 2,000 mg by mouth daily.   Vitamin D3 50 MCG (2000 UT) Tabs Take 1 tablet by mouth daily.      Follow-up Information    Hoyt Koch, MD. Schedule an appointment as soon as possible for a visit in 1 week(s).   Specialty: Internal Medicine Contact information: [place] [address] [phone]        Elouise Munroe, MD .   Specialties: Cardiology, Radiology Contact information: [address] [place] [address] [phone]        Garvin Fila, MD. Schedule an appointment as soon as possible for a visit in 4 week(s).   Specialties: Neurology, Radiology Contact information: [address] [place]  [address] [phone]          Allergies  Allergen Reactions  . Capsule [Gelatin] Hives    Any medication that is in gel capsule form  . Capsaicin Other (See Comments)    Unknown rxn    Consultations:  neurology   Procedures/Studies: CT ANGIO HEAD W OR WO CONTRAST  Result Date: 11/13/2019 CLINICAL DATA:  Stroke, follow-up EXAM: CT ANGIOGRAPHY HEAD AND NECK TECHNIQUE: Multidetector CT imaging of the head and neck was performed using the standard protocol during bolus administration of intravenous contrast. Multiplanar CT image reconstructions and MIPs were obtained to evaluate the vascular anatomy. Carotid stenosis measurements (when applicable) are obtained utilizing NASCET criteria, using the distal internal carotid diameter as the denominator. CONTRAST:  120mL OMNIPAQUE IOHEXOL 350 MG/ML SOLN COMPARISON:  MRI 11/12/2019 FINDINGS: CT HEAD FINDINGS Brain: Small area of ill-defined hypoattenuation is present involving the left basal ganglia and adjacent white matter corresponding to infarction on MRI. There is no acute intracranial hemorrhage. 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 There is a hyperdense, partially calcified mass along the right anterior clinoid process. Vascular: Intracranial atherosclerotic calcification at the skull base. Skull: Unremarkable. Sinuses: Mild patchy mucosal thickening. Orbits: Unremarkable. Review of the MIP images confirms the above findings CTA NECK FINDINGS Aortic arch: Great vessel origins are patent. 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 Right carotid system: Common, internal,  and external carotid arteries are patent. There is mild noncalcified plaque along the common carotid. Minimal plaque is present at the ICA origin without measurable stenosis. Left carotid system: Common, internal, and external carotid arteries are patent. There is no measurable stenosis at the ICA origin. Vertebral arteries: Patent.  Right vertebral artery is dominant. Skeleton: Unremarkable. Other neck: No neck mass or adenopathy. Upper chest: No apical lung mass. Review of the MIP images confirms the above findings CTA HEAD FINDINGS Anterior circulation: Intracranial internal carotid arteries are patent with calcified plaque along the cavernous and paraclinoid portions causing mild stenosis. Anterior and middle cerebral arteries are patent. There is moderate to severe stenosis of the proximal right A1 ACA. There is likely atherosclerotic irregularity of the A2 and more distal ACA branches. There is moderate stenosis of the right M1 MCA. There is likely atherosclerotic irregularity of the M2 and more distal MCA branches. Stenosis ranges from mild to moderate. Posterior circulation: Intracranial vertebral arteries are patent. Left vertebral artery becomes diminutive after origin of a PICA. Basilar artery is patent. Posterior cerebral arteries are patent. A right posterior communicating artery is present. Possible diminutive left posterior communicating artery. There is atherosclerotic irregularity of the P2 and more distal PCA branches. Stenosis primarily ranges from mild to moderate. There is moderate to severe stenosis of the proximal P2 PCA segments bilaterally. Venous sinuses: As permitted by contrast timing, patent. Review of the MIP images confirms the above findings IMPRESSION: Evolving infarction of the left basal ganglia and adjacent white matter. No acute intracranial hemorrhage. No large vessel occlusion. No hemodynamically significant stenosis in the neck. Multifocal intracranial atherosclerosis.  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 Postcontrast MR imaging through the orbits is recommended. Electronically Signed   By: Macy Mis M.D.   On: 11/13/2019 14:51   CT ANGIO NECK W OR WO CONTRAST  Result Date: 11/13/2019 CLINICAL DATA:  Stroke, follow-up EXAM: CT ANGIOGRAPHY HEAD AND NECK TECHNIQUE: Multidetector CT imaging of the head and neck was performed using the standard protocol during bolus administration of intravenous contrast. Multiplanar CT image reconstructions and MIPs were obtained to evaluate the vascular anatomy. Carotid stenosis measurements (when applicable) are obtained utilizing NASCET criteria, using the distal internal carotid diameter as the denominator. CONTRAST:  172mL OMNIPAQUE IOHEXOL 350 MG/ML SOLN COMPARISON:  MRI 11/12/2019 FINDINGS: CT HEAD FINDINGS Brain: Small area of ill-defined hypoattenuation is present involving the left basal ganglia and adjacent white matter corresponding to infarction on MRI. There is no acute intracranial hemorrhage. 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 There is a hyperdense, partially calcified mass along the right anterior clinoid process. Vascular: Intracranial atherosclerotic calcification at the skull base. Skull: Unremarkable. Sinuses: Mild patchy mucosal thickening. Orbits: Unremarkable. Review of the MIP images confirms the above findings CTA NECK FINDINGS Aortic arch: Great vessel origins are patent. There is aberrant origin of the right subclavian  artery, an anatomic variant. Right carotid system: Common, internal, and external carotid arteries are patent. There is mild noncalcified plaque along the common carotid. Minimal plaque is present at the ICA origin without measurable stenosis. Left carotid system: Common, internal, and external carotid arteries are patent. There is no measurable stenosis at the ICA origin. Vertebral arteries: Patent.  Right vertebral artery is dominant. Skeleton: Unremarkable. Other  neck: No neck mass or adenopathy. Upper chest: No apical lung mass. Review of the MIP images confirms the above findings CTA HEAD FINDINGS Anterior circulation: Intracranial internal carotid arteries are patent with calcified plaque along the cavernous and paraclinoid portions causing mild stenosis. Anterior and middle cerebral arteries are patent. There is moderate to severe stenosis of the proximal right A1 ACA. There is likely atherosclerotic irregularity of the A2 and more distal ACA branches. There is moderate stenosis of the right M1 MCA. There is likely atherosclerotic irregularity of the M2 and more distal MCA branches. Stenosis ranges from mild to moderate. Posterior circulation: Intracranial vertebral arteries are patent. Left vertebral artery becomes diminutive after origin of a PICA. Basilar artery is patent. Posterior cerebral arteries are patent. A right posterior communicating artery is present. Possible diminutive left posterior communicating artery. There is atherosclerotic irregularity of the P2 and more distal PCA branches. Stenosis primarily ranges from mild to moderate. There is moderate to severe stenosis of the proximal P2 PCA segments bilaterally. Venous sinuses: As permitted by contrast timing, patent. Review of the MIP images confirms the above findings IMPRESSION: Evolving infarction of the left basal ganglia and adjacent white matter. No acute intracranial hemorrhage. No large vessel occlusion. No hemodynamically significant stenosis in the neck. Multifocal intracranial atherosclerosis. 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 Postcontrast MR imaging through the orbits is recommended. Electronically Signed   By: Macy Mis M.D.   On: 11/13/2019 14:51   MR Brain Wo Contrast (neuro protocol)  Result Date: 11/12/2019 CLINICAL DATA:  Right upper extremity weakness.  Rule out stroke EXAM: MRI HEAD WITHOUT CONTRAST TECHNIQUE: Multiplanar, multiecho pulse sequences  of the brain and surrounding structures were obtained without intravenous contrast. COMPARISON:  None. FINDINGS: Brain: Acute infarct in the posterior external capsule on the left. No other acute infarct. Chronic infarct in the left corona radiata. Small chronic infarcts in the cerebellum bilaterally. Brainstem normal. Ventricle size normal. No mass or edema. Incomplete study.  The patient refused to complete the study. Vascular: Normal arterial flow voids. Skull and upper cervical spine: Negative Sinuses/Orbits: Negative Other: None IMPRESSION: Acute infarct in the posterior external capsule on the left. Mild chronic ischemia. The patient refused to complete the study. Electronically Signed   By: Franchot Gallo M.D.   On: 11/12/2019 16:35   ECHOCARDIOGRAM COMPLETE BUBBLE STUDY  Result Date: 11/13/2019   ECHOCARDIOGRAM REPORT   Patient Name:   MARELLY RESKE Date of Exam: 11/13/2019 Medical Rec #:  [id number]    Height:       68.5 in Accession #:    [id number]   Weight:       201.0 lb Date of Birth:  1956-02-26    BSA:          2.06 m Patient Age:    74 years     BP:           186/96 mmHg Patient Gender: F            HR:           65 bpm. Exam  Location:  Inpatient Procedure: 2D Echo, Cardiac Doppler, Color Doppler and Saline Contrast Bubble            Study Indications:    CVA  History:        Patient has no prior history of Echocardiogram examinations.                 CAD; Risk Factors:Hypertension and Dyslipidemia. Breast cancer,                 Radiation therapy.  Sonographer:    Dustin Flock Referring Phys: [id number] MIR MOHAMMED Victor  1. Left ventricular ejection fraction, by visual estimation, is 60 to 65%. The left ventricle has normal function. There is no left ventricular hypertrophy.  2. Elevated left atrial pressure.  3. Left ventricular diastolic parameters are consistent with Grade II diastolic dysfunction (pseudonormalization).  4. The left ventricle has no regional wall motion  abnormalities.  5. Global right ventricle has normal systolic function.The right ventricular size is normal. No increase in right ventricular wall thickness.  6. Left atrial size was normal.  7. Right atrial size was normal.  8. The mitral valve is normal in structure. Mild mitral valve regurgitation. No evidence of mitral stenosis.  9. The tricuspid valve is normal in structure. Tricuspid valve regurgitation is mild. 10. The aortic valve is normal in structure. Aortic valve regurgitation is not visualized. No evidence of aortic valve sclerosis or stenosis. 11. The pulmonic valve was normal in structure. Pulmonic valve regurgitation is not visualized. 12. Moderately elevated pulmonary artery systolic pressure. 13. The inferior vena cava is normal in size with greater than 50% respiratory variability, suggesting right atrial pressure of 3 mmHg. FINDINGS  Left Ventricle: Left ventricular ejection fraction, by visual estimation, is 60 to 65%. The left ventricle has normal function. The left ventricle has no regional wall motion abnormalities. There is no left ventricular hypertrophy. Left ventricular diastolic parameters are consistent with Grade II diastolic dysfunction (pseudonormalization). Elevated left atrial pressure. Right Ventricle: The right ventricular size is normal. No increase in right ventricular wall thickness. Global RV systolic function is has normal systolic function. The tricuspid regurgitant velocity is 3.00 m/s, and with an assumed right atrial pressure  of 8 mmHg, the estimated right ventricular systolic pressure is moderately elevated at 44.0 mmHg. Left Atrium: Left atrial size was normal in size. Right Atrium: Right atrial size was normal in size Pericardium: There is no evidence of pericardial effusion. Mitral Valve: The mitral valve is normal in structure. Mild mitral valve regurgitation. No evidence of mitral valve stenosis by observation. Tricuspid Valve: The tricuspid valve is normal in  structure. Tricuspid valve regurgitation is mild. Aortic Valve: The aortic valve is normal in structure. Aortic valve regurgitation is not visualized. The aortic valve is structurally normal, with no evidence of sclerosis or stenosis. Pulmonic Valve: The pulmonic valve was normal in structure. Pulmonic valve regurgitation is not visualized. Pulmonic regurgitation is not visualized. Aorta: The aortic root, ascending aorta and aortic arch are all structurally normal, with no evidence of dilitation or obstruction. Venous: The inferior vena cava is normal in size with greater than 50% respiratory variability, suggesting right atrial pressure of 3 mmHg. IAS/Shunts: No atrial level shunt detected by color flow Doppler. Agitated saline contrast was given intravenously to evaluate for intracardiac shunting. Saline contrast bubble study was negative, with no evidence of any interatrial shunt. There is no evidence of a patent foramen ovale. No ventricular septal defect is seen or  detected. There is no evidence of an atrial septal defect.  LEFT VENTRICLE PLAX 2D LVIDd:         4.10 cm  Diastology LVIDs:         2.80 cm  LV e' lateral:   7.40 cm/s LV PW:         1.10 cm  LV E/e' lateral: 13.2 LV IVS:        1.20 cm  LV e' medial:    4.46 cm/s LVOT diam:     1.90 cm  LV E/e' medial:  22.0 LV SV:         45 ml LV SV Index:   21.03 LVOT Area:     2.84 cm  RIGHT VENTRICLE RV Basal diam:  2.70 cm RV S prime:     14.00 cm/s TAPSE (M-mode): 2.9 cm LEFT ATRIUM             Index       RIGHT ATRIUM           Index LA diam:        2.90 cm 1.41 cm/m  RA Area:     10.80 cm LA Vol (A2C):   28.8 ml 13.99 ml/m RA Volume:   22.30 ml  10.83 ml/m LA Vol (A4C):   49.3 ml 23.94 ml/m LA Biplane Vol: 39.9 ml 19.38 ml/m  AORTIC VALVE LVOT Vmax:   109.00 cm/s LVOT Vmean:  75.100 cm/s LVOT VTI:    0.253 m  AORTA Ao Root diam: 2.50 cm MITRAL VALVE                        TRICUSPID VALVE MV Area (PHT): 2.95 cm             TR Peak grad:   36.0 mmHg  MV PHT:        74.53 msec           TR Vmax:        308.00 cm/s MV Decel Time: 257 msec MV E velocity: 97.90 cm/s 103 cm/s  SHUNTS MV A velocity: 83.90 cm/s 70.3 cm/s Systemic VTI:  0.25 m MV E/A ratio:  1.17       1.5       Systemic Diam: 1.90 cm  Dani Gobble Croitoru MD Electronically signed by Sanda Klein MD Signature Date/Time: 11/13/2019/3:45:07 PM    Final      Transthoracic echocardiogram: IMPRESSIONS    1. Left ventricular ejection fraction, by visual estimation, is 60 to 65%. The left ventricle has normal function. There is no left ventricular hypertrophy.  2. Elevated left atrial pressure.  3. Left ventricular diastolic parameters are consistent with Grade II diastolic dysfunction (pseudonormalization).  4. The left ventricle has no regional wall motion abnormalities.  5. Global right ventricle has normal systolic function.The right ventricular size is normal. No increase in right ventricular wall thickness.  6. Left atrial size was normal.  7. Right atrial size was normal.  8. The mitral valve is normal in structure. Mild mitral valve regurgitation. No evidence of mitral stenosis.  9. The tricuspid valve is normal in structure. Tricuspid valve regurgitation is mild. 10. The aortic valve is normal in structure. Aortic valve regurgitation is not visualized. No evidence of aortic valve sclerosis or stenosis. 11. The pulmonic valve was normal in structure. Pulmonic valve regurgitation is not visualized. 12. Moderately elevated pulmonary artery systolic pressure. 13. The inferior vena cava is normal in size with  greater than 50% respiratory variability, suggesting right atrial pressure of 3 mmHg.   Subjective: Patient seen and examined at bedside, resting comfortably in bedside chair.  No complaints.  Seen by PT this morning with recommendations outpatient PT.  Neurology signed off, with recommendation of outpatient follow-up.  No specific complaints this morning.  Denies headache, no  fever/chills/night sweats, no nausea/vomiting/diarrhea, no chest pain, no palpitations, no shortness of breath, no abdominal pain.  No acute events overnight per nursing staff.   Discharge Exam: Vitals:   11/14/19 0406 11/14/19 0759  BP: (!) 149/78 (!) 177/84  Pulse: (!) 59 66  Resp: 16   Temp: 98.5 F (36.9 C) 98.2 F (36.8 C)  SpO2: 100% 100%   Vitals:   11/13/19 2022 11/13/19 2346 11/14/19 0406 11/14/19 0759  BP: (!) 141/78 (!) 159/67 (!) 149/78 (!) 177/84  Pulse: 80 63 (!) 59 66  Resp: 17 18 16    Temp: 98.2 F (36.8 C) 97.8 F (36.6 C) 98.5 F (36.9 C) 98.2 F (36.8 C)  TempSrc: Oral Oral Oral Oral  SpO2: 97% 100% 100% 100%  Weight:      Height:        General: Pt is alert, awake, not in acute distress Cardiovascular: RRR, S1/S2 +, no rubs, no gallops Respiratory: CTA bilaterally, no wheezing, no rhonchi Abdominal: Soft, NT, ND, bowel sounds + Extremities: no edema, no cyanosis    The results of significant diagnostics from this hospitalization (including imaging, microbiology, ancillary and laboratory) are listed below for reference.     Microbiology: Recent Results (from the past 240 hour(s))  SARS CORONAVIRUS 2 (TAT 6-24 HRS) Nasopharyngeal Urine, Clean Catch     Status: None   Collection Time: 11/12/19  5:37 PM   Specimen: Urine, Clean Catch; Nasopharyngeal  Result Value Ref Range Status   SARS Coronavirus 2 NEGATIVE NEGATIVE Final    Comment: (NOTE) SARS-CoV-2 target nucleic acids are NOT DETECTED. The SARS-CoV-2 RNA is generally detectable in upper and lower respiratory specimens during the acute phase of infection. Negative results do not preclude SARS-CoV-2 infection, do not rule out co-infections with other pathogens, and should not be used as the sole basis for treatment or other patient management decisions. Negative results must be combined with clinical observations, patient history, and epidemiological information. The expected result is  Negative. Fact Sheet for Patients: SugarRoll.be Fact Sheet for Healthcare Providers: https://www.woods-mathews.com/ This test is not yet approved or cleared by the [place] FDA and  has been authorized for detection and/or diagnosis of SARS-CoV-2 by FDA under an Emergency Use Authorization (EUA). This EUA will remain  in effect (meaning this test can be used) for the duration of the COVID-19 declaration under Section 56 4(b)(1) of the Act, 21 U.S.C. section 360bbb-3(b)(1), unless the authorization is terminated or revoked sooner. Performed at [facility], [place] [address], [place], [place] [address]      Labs: BNP (last 3 results) No results for input(s): BNP in the last 8760 hours. Basic Metabolic Panel: Recent Labs  Lab 11/12/19 1431 11/12/19 1749  NA 140 138  K 4.0 3.8  CL 103 106  CO2 23  --   GLUCOSE 217* 190*  BUN 13 11  CREATININE 1.37* 1.20*  CALCIUM 10.1  --    Liver Function Tests: Recent Labs  Lab 11/12/19 1431  AST 29  ALT 48*  ALKPHOS 68  BILITOT 0.6  PROT 8.2*  ALBUMIN 4.4   No results for input(s): LIPASE, AMYLASE in  the last 168 hours. No results for input(s): AMMONIA in the last 168 hours. CBC: Recent Labs  Lab 11/12/19 1431 11/12/19 1749  WBC 7.0  --   NEUTROABS 4.7  --   HGB 14.1 13.6  HCT 41.7 40.0  MCV 84.1  --   PLT 232  --    Cardiac Enzymes: No results for input(s): CKTOTAL, CKMB, CKMBINDEX, TROPONINI in the last 168 hours. BNP: Invalid input(s): POCBNP CBG: No results for input(s): GLUCAP in the last 168 hours. D-Dimer No results for input(s): DDIMER in the last 72 hours. Hgb A1c Recent Labs    11/13/19 0601  HGBA1C 9.7*   Lipid Profile Recent Labs    11/13/19 0601  CHOL 297*  HDL 42  LDLCALC 202*  TRIG 264*  CHOLHDL 7.1   Thyroid function studies No results for input(s): TSH, T4TOTAL, T3FREE, THYROIDAB in the last 72 hours.  Invalid input(s): FREET3 Anemia  work up No results for input(s): VITAMINB12, FOLATE, FERRITIN, TIBC, IRON, RETICCTPCT in the last 72 hours. Urinalysis    Component Value Date/Time   COLORURINE YELLOW 11/12/2019 1737   APPEARANCEUR CLOUDY (A) 11/12/2019 1737   LABSPEC 1.018 11/12/2019 1737   PHURINE 5.0 11/12/2019 1737   GLUCOSEU NEGATIVE 11/12/2019 1737   HGBUR NEGATIVE 11/12/2019 1737   BILIRUBINUR NEGATIVE 11/12/2019 1737   BILIRUBINUR Neg 08/11/2017 1315   KETONESUR NEGATIVE 11/12/2019 1737   PROTEINUR NEGATIVE 11/12/2019 1737   UROBILINOGEN 0.2 08/11/2017 1315   NITRITE NEGATIVE 11/12/2019 1737   LEUKOCYTESUR SMALL (A) 11/12/2019 1737   Sepsis Labs Invalid input(s): PROCALCITONIN,  WBC,  LACTICIDVEN Microbiology Recent Results (from the past 240 hour(s))  SARS CORONAVIRUS 2 (TAT 6-24 HRS) Nasopharyngeal Urine, Clean Catch     Status: None   Collection Time: 11/12/19  5:37 PM   Specimen: Urine, Clean Catch; Nasopharyngeal  Result Value Ref Range Status   SARS Coronavirus 2 NEGATIVE NEGATIVE Final    Comment: (NOTE) SARS-CoV-2 target nucleic acids are NOT DETECTED. The SARS-CoV-2 RNA is generally detectable in upper and lower respiratory specimens during the acute phase of infection. Negative results do not preclude SARS-CoV-2 infection, do not rule out co-infections with other pathogens, and should not be used as the sole basis for treatment or other patient management decisions. Negative results must be combined with clinical observations, patient history, and epidemiological information. The expected result is Negative. Fact Sheet for Patients: SugarRoll.be Fact Sheet for Healthcare Providers: https://www.woods-mathews.com/ This test is not yet approved or cleared by the [place] FDA and  has been authorized for detection and/or diagnosis of SARS-CoV-2 by FDA under an Emergency Use Authorization (EUA). This EUA will remain  in effect (meaning this test  can be used) for the duration of the COVID-19 declaration under Section 56 4(b)(1) of the Act, 21 U.S.C. section 360bbb-3(b)(1), unless the authorization is terminated or revoked sooner. Performed at [facility], [place] [address], [place], [place] [address]      Time coordinating discharge: Over 30 minutes  SIGNED:   Jamarii Banks J [place] DO  [facility] 11/14/2019, 9:57 AM

## 2019-11-14 NOTE — Evaluation (Signed)
Occupational Therapy Evaluation Patient Details Name: Audrey Fischer MRN: [id number] DOB: Apr 18, 1956 Today's Date: 11/14/2019    History of Present Illness Audrey Fischer is a 63 y.o. female with history status post coronary artery stent placement, postmenopausal bleeding, hypertension, hyperlipidemia, CAD and breast cancer. Presents with rigth facial and fingertips tingling and drifting to the right. 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    Clinical Impression   Pt admitted with the above diagnoses and presents with below problem list. Pt will benefit from continued acute OT to address the below listed deficits and maximize independence with basic ADLs prior to d/c home. At baseline pt is independent with ADLs, works as a Pharmacist, hospital. Pt presents with impaired sensation on palmar side of R hand 2nd through 5th digits, high level fine motor coordination deficits and some impaired balance. Introduced home exercise program for R hand and discussed safety with ADLs at home.      Follow Up Recommendations  Outpatient OT    Equipment Recommendations  None recommended by OT    Recommendations for Other Services       Precautions / Restrictions Precautions Precautions: Fall Restrictions Weight Bearing Restrictions: No      Mobility Bed Mobility Overal bed mobility: Independent             General bed mobility comments: up in chair  Transfers Overall transfer level: Needs assistance Equipment used: None Transfers: Sit to/from Stand Sit to Stand: Supervision              Balance Overall balance assessment: Mild deficits observed, not formally tested                                         ADL either performed or assessed with clinical judgement   ADL Overall ADL's : Needs assistance/impaired Eating/Feeding: Set up;Sitting   Grooming: Set up;Sitting   Upper Body Bathing: Set up;Sitting   Lower  Body Bathing: Supervison/ safety;Sit to/from stand   Upper Body Dressing : Set up;Sitting   Lower Body Dressing: Supervision/safety;Sit to/from stand   Toilet Transfer: Supervision/safety;Ambulation   Toileting- Clothing Manipulation and Hygiene: Supervision/safety;Sit to/from stand   Tub/ Shower Transfer: Supervision/safety;Ambulation;Shower seat   Functional mobility during ADLs: Supervision/safety General ADL Comments: Pt assess in recliner. Focused on RUE deficits. Assist levels involving transfers/mobility per chart review and discussion with PT.      Vision         Perception     Praxis      Pertinent Vitals/Pain Pain Assessment: No/denies pain     Hand Dominance Right   Extremity/Trunk Assessment Upper Extremity Assessment Upper Extremity Assessment: RUE deficits/detail RUE Deficits / Details: decreased sensation on palmar side of 2nd through 5th digits. Able to use RUE functionally including feeding, drinking from cup and typing. Pt reports higher level deficits in [facility].  RUE Sensation: decreased light touch RUE Coordination: decreased fine motor   Lower Extremity Assessment Lower Extremity Assessment: Defer to PT evaluation RLE Deficits / Details: Strength 5/5 LLE Deficits / Details: Strength 5/5       Communication Communication Communication: No difficulties   Cognition Arousal/Alertness: Awake/alert Behavior During Therapy: WFL for tasks assessed/performed Overall Cognitive Status: Within Functional Limits for tasks assessed  General Comments: Good insight into deficits   General Comments       Exercises Exercises: Other exercises Other Exercises Other Exercises: Issued Soft (yellow) theraputty and provided written HEP plan as well as printout of [place] exercises and instructions for HEP for [facility] at home.    Shoulder Instructions      Home Living Family/patient expects to be discharged to:: Private  residence Living Arrangements: Children(son) Available Help at Discharge: Family Type of Home: House Home Access: Stairs to enter Technical brewer of Steps: 5   Home Layout: Able to live on main level with bedroom/bathroom               Home Equipment: Walker - 2 wheels;Bedside commode;Other (comment);Shower seat          Prior Functioning/Environment Level of Independence: Independent        Comments: Works as a Careers adviser at the [employer]        OT Problem List: Impaired balance (sitting and/or standing);Decreased coordination;Decreased knowledge of use of DME or AE;Decreased knowledge of precautions;Impaired sensation      OT Treatment/Interventions: Self-care/ADL training;Therapeutic exercise;DME and/or AE instruction;Therapeutic activities;Patient/family education;Balance training    OT Goals(Current goals can be found in the care plan section) Acute Rehab OT Goals 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." OT Goal Formulation: With patient Time For Goal Achievement: 11/28/19 Potential to Achieve Goals: Good ADL Goals Pt Will Perform Grooming: with modified independence;standing Pt Will Perform Tub/Shower Transfer: Tub transfer;with modified independence;ambulating;shower seat Pt/caregiver will Perform Home Exercise Program: Right Upper extremity;With theraputty;Independently;With written HEP provided  OT Frequency: Min 2X/week   Barriers to D/C:            Co-evaluation              AM-PAC OT "6 Clicks" Daily Activity     Outcome Measure Help from another person eating meals?: None Help from another person taking care of personal grooming?: A Little Help from another person toileting, which includes using toliet, bedpan, or urinal?: None Help from another person bathing (including washing, rinsing, drying)?: A Little Help from another person to put on and taking off regular upper body clothing?: None Help from another person  to put on and taking off regular lower body clothing?: None 6 Click Score: 22   End of Session    Activity Tolerance: Patient tolerated treatment well Patient left: in chair;with call bell/phone within reach  OT Visit Diagnosis: Unsteadiness on feet (R26.81);Ataxia, unspecified (R27.0);Other (comment)(Impaired sensation R hand)                Time: [id number] OT Time Calculation (min): 11 min Charges:  OT General Charges $OT Visit: 1 Visit OT Evaluation $OT Eval Low Complexity: [place], OT Acute Rehabilitation Services Pager: [phone] Office: [phone]   Hortencia Pilar 11/14/2019, 12:23 PM

## 2019-11-19 ENCOUNTER — Encounter: Payer: Self-pay | Admitting: Internal Medicine

## 2019-11-19 ENCOUNTER — Ambulatory Visit (INDEPENDENT_AMBULATORY_CARE_PROVIDER_SITE_OTHER): Payer: BC Managed Care – PPO | Admitting: Internal Medicine

## 2019-11-19 ENCOUNTER — Other Ambulatory Visit: Payer: Self-pay

## 2019-11-19 VITALS — BP 152/98 | HR 65 | Temp 99.2°F | Ht 68.5 in | Wt 198.0 lb

## 2019-11-19 DIAGNOSIS — E785 Hyperlipidemia, unspecified: Secondary | ICD-10-CM

## 2019-11-19 DIAGNOSIS — E119 Type 2 diabetes mellitus without complications: Secondary | ICD-10-CM | POA: Diagnosis not present

## 2019-11-19 DIAGNOSIS — E1169 Type 2 diabetes mellitus with other specified complication: Secondary | ICD-10-CM

## 2019-11-19 DIAGNOSIS — Z8673 Personal history of transient ischemic attack (TIA), and cerebral infarction without residual deficits: Secondary | ICD-10-CM | POA: Diagnosis not present

## 2019-11-19 DIAGNOSIS — E118 Type 2 diabetes mellitus with unspecified complications: Secondary | ICD-10-CM | POA: Insufficient documentation

## 2019-11-19 DIAGNOSIS — I10 Essential (primary) hypertension: Secondary | ICD-10-CM

## 2019-11-19 MED ORDER — METFORMIN HCL 500 MG PO TABS: 500.0000 mg | ORAL_TABLET | Freq: Every day | ORAL | 3 refills | Status: DC

## 2019-11-19 NOTE — Assessment & Plan Note (Signed)
On crestor 40 mg daily. Needs recheck in 3 months.

## 2019-11-19 NOTE — Assessment & Plan Note (Signed)
Taking aspirin and plavix for 3 weeks then will continue on aspirin 81 mg daily alone. She is on statin. Will follow up with neurology. She has newly diagnosed diabetes and blood pressure so is high risk for future stroke.

## 2019-11-19 NOTE — Assessment & Plan Note (Signed)
Foot exam done, reminded about eye exam. Start metformin 500 mg daily today and follow up 3 months. She is not on ACE-I or ARB and did not want to start multiple new meds today. Will consider start at follow up BP check in 2-3 weeks. Complicated by stroke and CAD. She is at high risk for future heart attack or stroke and explained that today. Needs recheck HgA1c in 3 months.

## 2019-11-19 NOTE — Assessment & Plan Note (Signed)
Does not want to make multiple changes today. Will keep atenolol 50 mg daily. Follow up in 2-3 weeks as her BP is still trending down gradually at home. If still high needs to start ACE-I with new diabetes. Her goal is 130/80.

## 2019-11-19 NOTE — Patient Instructions (Addendum)
We have sent in metformin to take 1 pill daily. Come back in 2-3 weeks to check the blood pressure and start another medicine for blood pressure if needed.   Diabetes Mellitus and Standards of Medical Care Managing diabetes (diabetes mellitus) can be complicated. Your diabetes treatment may be managed by a team of health care providers, including:  A physician who specializes in diabetes (endocrinologist).  A nurse practitioner or physician assistant.  Nurses.  A diet and nutrition specialist (registered dietitian).  A certified diabetes educator (CDE).  An exercise specialist.  A pharmacist.  An eye doctor.  A foot specialist (podiatrist).  A dentist.  A primary care provider.  A mental health provider. Your health care providers follow guidelines to help you get the best quality of care. The following schedule is a general guideline for your diabetes management plan. Your health care providers may give you more specific instructions. Physical exams Upon being diagnosed with diabetes mellitus, and each year after that, your health care provider will ask about your medical and family history. He or she will also do a physical exam. Your exam may include:  Measuring your height, weight, and body mass index (BMI).  Checking your blood pressure. This will be done at every routine medical visit. Your target blood pressure may vary depending on your medical conditions, your age, and other factors.  Thyroid gland exam.  Skin exam.  Screening for damage to your nerves (peripheral neuropathy). This may include checking the pulse in your legs and feet and checking the level of sensation in your hands and feet.  A complete foot exam to inspect the structure and skin of your feet, including checking for cuts, bruises, redness, blisters, sores, or other problems.  Screening for blood vessel (vascular) problems, which may include checking the pulse in your legs and feet and checking  your temperature. Blood tests Depending on your treatment plan and your personal needs, you may have the following tests done:  HbA1c (hemoglobin A1c). This test provides information about blood sugar (glucose) control over the previous 2-3 months. It is used to adjust your treatment plan, if needed. This test will be done: ? At least 2 times a year, if you are meeting your treatment goals. ? 4 times a year, if you are not meeting your treatment goals or if treatment goals have changed.  Lipid testing, including total, LDL, and HDL cholesterol and triglyceride levels. ? The goal for LDL is less than 100 mg/dL (5.5 mmol/L). If you are at high risk for complications, the goal is less than 70 mg/dL (3.9 mmol/L). ? The goal for HDL is 40 mg/dL (2.2 mmol/L) or higher for men and 50 mg/dL (2.8 mmol/L) or higher for women. An HDL cholesterol of 60 mg/dL (3.3 mmol/L) or higher gives some protection against heart disease. ? The goal for triglycerides is less than 150 mg/dL (8.3 mmol/L).  Liver function tests.  Kidney function tests.  Thyroid function tests. Dental and eye exams  Visit your dentist two times a year.  If you have type 1 diabetes, your health care provider may recommend an eye exam 3-5 years after you are diagnosed, and then once a year after your first exam. ? For children with type 1 diabetes, a health care provider may recommend an eye exam when your child is age 21 or older and has had diabetes for 3-5 years. After the first exam, your child should get an eye exam once a year.  If you have  type 2 diabetes, your health care provider may recommend an eye exam as soon as you are diagnosed, and then once a year after your first exam. Immunizations   The yearly flu (influenza) vaccine is recommended for everyone 6 months or older who has diabetes.  The pneumonia (pneumococcal) vaccine is recommended for everyone 2 years or older who has diabetes. If you are 1 or older, you may get  the pneumonia vaccine as a series of two separate shots.  The hepatitis B vaccine is recommended for adults shortly after being diagnosed with diabetes.  Adults and children with diabetes should receive all other vaccines according to age-specific recommendations from the Centers for Disease Control and Prevention (CDC). Mental and emotional health Screening for symptoms of eating disorders, anxiety, and depression is recommended at the time of diagnosis and afterward as needed. If your screening shows that you have symptoms (positive screening result), you may need more evaluation and you may work with a mental health care provider. Treatment plan Your treatment plan will be reviewed at every medical visit. You and your health care provider will discuss:  How you are taking your medicines, including insulin.  Any side effects you are experiencing.  Your blood glucose target goals.  The frequency of your blood glucose monitoring.  Lifestyle habits, such as activity level as well as tobacco, alcohol, and substance use. Diabetes self-management education Your health care provider will assess how well you are monitoring your blood glucose levels and whether you are taking your insulin correctly. He or she may refer you to:  A certified diabetes educator to manage your diabetes throughout your life, starting at diagnosis.  A registered dietitian who can create or review your personal nutrition plan.  An exercise specialist who can discuss your activity level and exercise plan. Summary  Managing diabetes (diabetes mellitus) can be complicated. Your diabetes treatment may be managed by a team of health care providers.  Your health care providers follow guidelines in order to help you get the best quality of care.  Standards of care including having regular physical exams, blood tests, blood pressure monitoring, immunizations, screening tests, and education about how to manage your  diabetes.  Your health care providers may also give you more specific instructions based on your individual health. This information is not intended to replace advice given to you by your health care provider. Make sure you discuss any questions you have with your health care provider. Document Released: 09/12/2009 Document Revised: 08/04/2018 Document Reviewed: 08/13/2016 Elsevier Patient Education  2020 [employer].

## 2019-11-19 NOTE — Progress Notes (Signed)
   Subjective:   Patient ID: Audrey Fischer, female    DOB: 1956/06/25, 63 y.o.   MRN: [id number]  HPI The patient is a 63 YO female coming in for hospital follow up (in for stroke, with high BP and new DM). She has started on statin. She is taking aspirin and plavix for 3 weeks then will continue on aspirin alone. Denies chest pains or SOB. Denies cough. Still having some hand numbness right and right leg weakness and clumsiness. She is going to start outpatient therapy soon. She is taking her medications as ordered. Denies side effects with the plavix. New diabetes and did not start on medicine yet. Does have significant family history of diabetes mom and dad. Denies falls since being home.   PMH, [facility], social history reviewed and updated  Review of Systems  Constitutional: Negative.   HENT: Negative.   Eyes: Negative.   Respiratory: Negative for cough, chest tightness and shortness of breath.   Cardiovascular: Negative for chest pain, palpitations and leg swelling.  Gastrointestinal: Negative for abdominal distention, abdominal pain, constipation, diarrhea, nausea and vomiting.  Musculoskeletal: Negative.   Skin: Negative.   Neurological: Positive for weakness and numbness.  Psychiatric/Behavioral: Negative.     Objective:  Physical Exam Constitutional:      Appearance: She is well-developed.  HENT:     Head: Normocephalic and atraumatic.  Cardiovascular:     Rate and Rhythm: Normal rate and regular rhythm.  Pulmonary:     Effort: Pulmonary effort is normal. No respiratory distress.     Breath sounds: Normal breath sounds. No wheezing or rales.  Abdominal:     General: Bowel sounds are normal. There is no distension.     Palpations: Abdomen is soft.     Tenderness: There is no abdominal tenderness. There is no rebound.  Musculoskeletal:     Cervical back: Normal range of motion.  Skin:    General: Skin is warm and dry.  Neurological:     Mental Status: She is alert and  oriented to person, place, and time.     Cranial Nerves: Cranial nerve deficit present.     Motor: Weakness present.     Coordination: Coordination normal.     Comments: Walking normally but careful     Vitals:   11/19/19 0847  BP: (!) 152/98  Pulse: 65  Temp: 99.2 F (37.3 C)  TempSrc: Oral  SpO2: 98%  Weight: 198 lb (89.8 kg)  Height: 5' 8.5" (1.74 m)    This visit occurred during the SARS-CoV-2 public health emergency.  Safety protocols were in place, including screening questions prior to the visit, additional usage of staff PPE, and extensive cleaning of exam room while observing appropriate contact time as indicated for disinfecting solutions.   Assessment & Plan:

## 2019-11-21 ENCOUNTER — Telehealth: Payer: Self-pay

## 2019-11-21 NOTE — Telephone Encounter (Signed)
Informed Cloyde Reams that we only needed report. She stated she would fax a copy over.   Copied from [place] [id number]. Topic: General - Other >> Nov 21, 2019 12:38 PM Keene Breath wrote: Reason for CRM: Called to get clarification on whether the office needs the reports or images sent to them.  CB# [phone]

## 2019-12-05 ENCOUNTER — Encounter: Payer: Self-pay | Admitting: Internal Medicine

## 2019-12-05 ENCOUNTER — Ambulatory Visit: Payer: BC Managed Care – PPO | Admitting: Internal Medicine

## 2019-12-05 ENCOUNTER — Other Ambulatory Visit: Payer: Self-pay

## 2019-12-05 VITALS — BP 136/83 | HR 62 | Temp 95.6°F | Ht 68.5 in | Wt 195.8 lb

## 2019-12-05 DIAGNOSIS — E119 Type 2 diabetes mellitus without complications: Secondary | ICD-10-CM

## 2019-12-05 DIAGNOSIS — I2510 Atherosclerotic heart disease of native coronary artery without angina pectoris: Secondary | ICD-10-CM | POA: Diagnosis not present

## 2019-12-05 DIAGNOSIS — Z8673 Personal history of transient ischemic attack (TIA), and cerebral infarction without residual deficits: Secondary | ICD-10-CM

## 2019-12-05 DIAGNOSIS — I10 Essential (primary) hypertension: Secondary | ICD-10-CM

## 2019-12-05 DIAGNOSIS — Z9861 Coronary angioplasty status: Secondary | ICD-10-CM | POA: Diagnosis not present

## 2019-12-05 DIAGNOSIS — E785 Hyperlipidemia, unspecified: Secondary | ICD-10-CM

## 2019-12-05 DIAGNOSIS — I214 Non-ST elevation (NSTEMI) myocardial infarction: Secondary | ICD-10-CM | POA: Diagnosis not present

## 2019-12-05 DIAGNOSIS — E1169 Type 2 diabetes mellitus with other specified complication: Secondary | ICD-10-CM

## 2019-12-05 NOTE — Progress Notes (Signed)
Cardiology Office Note:    Date:  12/05/2019   ID:  Audrey Fischer, DOB 04-13-1956, MRN [id number]  PCP:  Hoyt Koch, MD  Cardiologist:  Elouise Munroe, MD  Electrophysiologist:  None   Referring MD: Hoyt Koch, *   Chief Complaint: Follow-up, history of NSTEMI, recent history of stroke  History of Present Illness:    Audrey Fischer is a 64 y.o. female with a hx of NSTEMI in March 2009 with LAD and OM stents, hypertension, hyperlipidemia, and history of breast cancer on anastrozole.  She also has a recent history of stroke.  I last saw the patient in [place] 2020 to establish care.  In December 2020 she suffered a stroke with symptoms starting on December 13 presenting with right hand numbness and tingling as well as gait disturbance.  MRI showed acute infarct in the posterior external capsule on the left.  She was recommended to take Plavix for 3 weeks in addition to indefinite aspirin therapy 81 mg daily.  Crestor was increased to 40 mg daily.  She was newly diagnosed with diabetes with an A1c of 9.7.  She is making remarkable recovery from a functional standpoint and is nearly to her baseline.  She is dedicated in her therapy and is determined to regain function without deficit.  I have commended her for her efforts.  She tells me that her primary presenting symptoms were uncoordinated right leg and right hand numbness fingers.  Currently her primary deficits are fine motor, she feels that her pincer grasp is weak.   We discussed risk factor modification at length today from a cardiovascular and cerebrovascular standpoint.  She denies chest discomfort, shortness of breath, palpitations, PND, orthopnea, significant leg swelling, denies syncope or presyncope.  Past Medical History:  Diagnosis Date  . Breast cancer ([place]) 07/09/15   left breast,  lower-inner quadrant   . Coronary artery disease    cardiologist-  dr Donnetta Hutching [facility] regional physicians, [facility] cardiology in  [facility])---  hx PCI w/ stents to OM and LAD 02-08-2008  . Hyperlipidemia   . Hypertension   . PMB (postmenopausal bleeding)   . S/P coronary artery stent placement     Past Surgical History:  Procedure Laterality Date  . CESAREAN SECTION  10-09-1996  . CORONARY ANGIOPLASTY WITH STENT PLACEMENT  02-08-2008   dr Donnetta Hutching   PCI and stent to OM and LAD  . DILATATION & CURRETTAGE/HYSTEROSCOPY WITH RESECTOCOPE N/A 08/15/2015   Procedure: DILATATION & CURETTAGE/HYSTEROSCOPY ;  Surgeon: Eldred Manges, MD;  Location: [place];  Service: Gynecology;  Laterality: N/A;  . DILATION AND CURETTAGE OF UTERUS  1994   blighted ovum    Current Medications: Current Meds  Medication Sig  . anastrozole (ARIMIDEX) 1 MG tablet Take 1 tablet (1 mg total) by mouth daily. (Patient taking differently: Take 1 mg by mouth at bedtime. )  . Ascorbic Acid (VITAMIN C GUMMIE PO) Take 2,000 mg by mouth daily.  Marland Kitchen aspirin 81 MG tablet Take 81 mg by mouth daily.  Marland Kitchen atenolol (TENORMIN) 100 MG tablet Take 50 mg by mouth at bedtime.   . Cholecalciferol (VITAMIN D3) 2000 UNITS TABS Take 1 tablet by mouth daily.  . clopidogrel (PLAVIX) 75 MG tablet Take 1 tablet (75 mg total) by mouth daily for 21 days.  . metFORMIN (GLUCOPHAGE) 500 MG tablet Take 1 tablet (500 mg total) by mouth daily with breakfast.  . nitroGLYCERIN (NITROSTAT) 0.4 MG SL tablet Place 0.4  mg under the tongue every 5 (five) minutes as needed for chest pain.   . rosuvastatin (CRESTOR) 40 MG tablet Take 1 tablet (40 mg total) by mouth daily.  . vitamin B-12 (CYANOCOBALAMIN) 500 MCG tablet Take 1 tablet (500 mcg total) by mouth daily.     Allergies:   Capsule [gelatin] and Capsaicin   Social History   Socioeconomic History  . Marital status: Single    Spouse name: Not on file  . Number of children: Not on file  . Years of education: Not on file  . Highest education level: Not on file  Occupational History  . Not on file  Tobacco  Use  . Smoking status: Never Smoker  . Smokeless tobacco: Never Used  Substance and Sexual Activity  . Alcohol use: Yes    Alcohol/week: 0.0 standard drinks    Comment: occasionally  . Drug use: No  . Sexual activity: Not on file  Other Topics Concern  . Not on file  Social History Narrative  . Not on file   Social Determinants of Health   Financial Resource Strain:   . Difficulty of Paying Living Expenses: Not on file  Food Insecurity:   . Worried About Charity fundraiser in the Last Year: Not on file  . Ran Out of Food in the Last Year: Not on file  Transportation Needs:   . Lack of Transportation (Medical): Not on file  . Lack of Transportation (Non-Medical): Not on file  Physical Activity:   . Days of Exercise per Week: Not on file  . Minutes of Exercise per Session: Not on file  Stress:   . Feeling of Stress : Not on file  Social Connections:   . Frequency of Communication with Friends and Family: Not on file  . Frequency of Social Gatherings with Friends and Family: Not on file  . Attends Religious Services: Not on file  . Active Member of Clubs or Organizations: Not on file  . Attends Archivist Meetings: Not on file  . Marital Status: Not on file     Family History: The patient's family history includes Colon cancer (age of onset: 8) in her mother; Heart disease in her father and mother; Stroke in her mother.  ROS:   Please see the history of present illness.    All other systems reviewed and are negative.  EKGs/Labs/Other Studies Reviewed:    The following studies were reviewed today:  EKG: Not performed today 11/12/2019-sinus rhythm rate 73 possible early repolarization    Recent Labs: 01/02/2019: TSH 1.55 11/12/2019: ALT 48; BUN 11; Creatinine, Ser 1.20; Hemoglobin 13.6; Platelets 232; Potassium 3.8; Sodium 138  Recent Lipid Panel    Component Value Date/Time   CHOL 297 (H) 11/13/2019 0601   TRIG 264 (H) 11/13/2019 0601   HDL 42  11/13/2019 0601   CHOLHDL 7.1 11/13/2019 0601   VLDL 53 (H) 11/13/2019 0601   LDLCALC 202 (H) 11/13/2019 0601    Physical Exam:    VS:  BP 136/83   Pulse 62   Temp (!) 95.6 F (35.3 C)   Ht 5' 8.5" (1.74 m)   Wt 195 lb 12.8 oz (88.8 kg)   SpO2 96%   BMI 29.34 kg/m     Wt Readings from Last 5 Encounters:  12/05/19 195 lb 12.8 oz (88.8 kg)  11/19/19 198 lb (89.8 kg)  11/12/19 201 lb (91.2 kg)  05/21/19 207 lb 6.4 oz (94.1 kg)  05/17/19 190 lb (86.2  kg)     Constitutional: No acute distress Eyes: sclera non-icteric, normal conjunctiva and lids ENMT: normal dentition, moist mucous membranes Cardiovascular: regular rhythm, normal rate, no murmurs. S1 and S2 normal. Radial pulses normal bilaterally. No jugular venous distention.  Respiratory: clear to auscultation bilaterally GI : normal bowel sounds, soft and nontender. No distention.   MSK: extremities warm, well perfused. No edema.  NEURO: moves all extremities. PSYCH: alert and oriented x 3, normal mood and affect.   ASSESSMENT:    1. Coronary artery disease involving native coronary artery of native heart without angina pectoris   2. History of percutaneous coronary intervention   3. Essential hypertension   4. Non-ST elevation (NSTEMI) myocardial infarction ([place])   5. Newly diagnosed diabetes ([place])   6. Hyperlipidemia associated with type 2 diabetes mellitus ([place])   7. Hx of completed stroke    PLAN:    CAD, history of NSTEMI, PCI to the LAD and OM-she is chest pain-free and has no significant dyspnea on exertion.  Continue medical therapy with aspirin 81 mg daily, Crestor 40 mg daily.  She has remained on atenolol 50 mg daily, and we discussed indications to transition to carvedilol.  She would like to think about this and do her own research prior to any medication changes.  I think this would help [place] optimize her blood pressure which is slightly above goal (her goal should be 120/80).  Took Plavix for 3 weeks in the  setting of stroke, then discontinued per her instructions.  Recent CVA-on aspirin 81 mg daily and high intensity statin therapy.  No concerning symptoms today.  Newly diagnosed diabetes, A1c 9.7-she has been placed on Metformin, this will be followed by her primary care physician.  At our next appointment we should consider initiation of SGLT2 inhibitor given history of CAD.  Hyperlipidemia-LDL 202, statin therapy has been intensified after stroke, reassess in March with lipid panel either with primary or myself.  Given history of diabetes, stroke, CAD, needs aggressive LDL reduction.  If not at goal, consider PCSK9 inhibitor.  Hypertension-I would like to transition the patient from atenolol to carvedilol for the benefit of her blood pressure.  She will think about this and let me know at her follow-up visit.   Total time of encounter: 40 minutes total time of encounter, including 30 minutes spent in face-to-face patient care. This time includes coordination of care and counseling regarding secondary prevention of cardiovascular disease. Remainder of non-face-to-face time involved reviewing chart documents/testing relevant to the patient encounter and documentation in the medical record.  Cherlynn Kaiser, MD [place]  [facility]    Medication Adjustments/Labs and Tests Ordered: Current medicines are reviewed at length with the patient today.  Concerns regarding medicines are outlined above.  No orders of the defined types were placed in this encounter.  No orders of the defined types were placed in this encounter.   Patient Instructions   Medication Instructions:  Your physician recommends that you continue on your current medications as directed. Please refer to the Current Medication list given to you today.  *If you need a refill on your cardiac medications before your next appointment, please call your pharmacy*  Lab Work: NONE If you have labs (blood work) drawn today  and your tests are completely normal, you will receive your results only by: Marland Kitchen MyChart Message (if you have MyChart) OR . A paper copy in the mail If you have any lab test that is abnormal or we  need to change your treatment, we will call you to review the results.  Testing/Procedures: NONE  Follow-Up: At [facility], you and your health needs are our priority.  As part of our continuing mission to provide you with exceptional heart care, we have created designated Provider Care Teams.  These Care Teams include your primary Cardiologist (physician) and Advanced Practice Providers (APPs -  Physician Assistants and Nurse Practitioners) who all work together to provide you with the care you need, when you need it.  Your next appointment:   3-4 week(s)  The format for your next appointment:   Virtual Visit   Provider:   Cherlynn Kaiser, MD  Other Instructions  Mediterranean Diet A Mediterranean diet refers to food and lifestyle choices that are based on the traditions of countries located on the [place]. This way of eating has been shown to help prevent certain conditions and improve outcomes for people who have chronic diseases, like kidney disease and heart disease. What are tips for following this plan? Lifestyle  Cook and eat meals together with your family, when possible.  Drink enough fluid to keep your urine clear or pale yellow.  Be physically active every day. This includes: ? Aerobic exercise like running or swimming. ? Leisure activities like gardening, walking, or housework.  Get 7-8 hours of sleep each night.  If recommended by your health care provider, drink red wine in moderation. This means 1 glass a day for nonpregnant women and 2 glasses a day for men. A glass of wine equals 5 oz (150 mL). Reading food labels   Check the serving size of packaged foods. For foods such as rice and pasta, the serving size refers to the amount of cooked product, not  dry.  Check the total fat in packaged foods. Avoid foods that have saturated fat or trans fats.  Check the ingredients list for added sugars, such as corn syrup. Shopping  At the grocery store, buy most of your food from the areas near the walls of the store. This includes: ? Fresh fruits and vegetables (produce). ? Grains, beans, nuts, and seeds. Some of these may be available in unpackaged forms or large amounts (in bulk). ? Fresh seafood. ? Poultry and eggs. ? Low-fat dairy products.  Buy whole ingredients instead of prepackaged foods.  Buy fresh fruits and vegetables in-season from local farmers markets.  Buy frozen fruits and vegetables in resealable bags.  If you do not have access to quality fresh seafood, buy precooked frozen shrimp or canned fish, such as tuna, salmon, or sardines.  Buy small amounts of raw or cooked vegetables, salads, or olives from the deli or salad bar at your store.  Stock your pantry so you always have certain foods on hand, such as olive oil, canned tuna, canned tomatoes, rice, pasta, and beans. Cooking  Cook foods with extra-virgin olive oil instead of using butter or other vegetable oils.  Have meat as a side dish, and have vegetables or grains as your main dish. This means having meat in small portions or adding small amounts of meat to foods like pasta or stew.  Use beans or vegetables instead of meat in common dishes like chili or lasagna.  Experiment with different cooking methods. Try roasting or broiling vegetables instead of steaming or sauteing them.  Add frozen vegetables to soups, stews, pasta, or rice.  Add nuts or seeds for added healthy fat at each meal. You can add these to yogurt, salads, or vegetable  dishes.  Marinate fish or vegetables using olive oil, lemon juice, garlic, and fresh herbs. Meal planning   Plan to eat 1 vegetarian meal one day each week. Try to work up to 2 vegetarian meals, if possible.  Eat seafood 2  or more times a week.  Have healthy snacks readily available, such as: ? Vegetable sticks with hummus. ? [place] yogurt. ? Fruit and nut trail mix.  Eat balanced meals throughout the week. This includes: ? Fruit: 2-3 servings a day ? Vegetables: 4-5 servings a day ? Low-fat dairy: 2 servings a day ? Fish, poultry, or lean meat: 1 serving a day ? Beans and legumes: 2 or more servings a week ? Nuts and seeds: 1-2 servings a day ? Whole grains: 6-8 servings a day ? Extra-virgin olive oil: 3-4 servings a day  Limit red meat and sweets to only a few servings a month What are my food choices?  Mediterranean diet ? Recommended  Grains: Whole-grain pasta. Brown rice. Bulgar wheat. Polenta. Couscous. Whole-wheat bread. [place].  Vegetables: Artichokes. Beets. Broccoli. Cabbage. Carrots. Eggplant. Green beans. Chard. Kale. Spinach. Onions. Leeks. Peas. Squash. Tomatoes. Peppers. Radishes.  Fruits: Apples. Apricots. Avocado. Berries. Bananas. Cherries. Dates. Figs. Grapes. Lemons. Melon. Oranges. Peaches. Plums. Pomegranate.  Meats and other protein foods: Beans. Almonds. Sunflower seeds. Pine nuts. Peanuts. [place]. Salmon. Scallops. Shrimp. [place]. Tilapia. Clams. Oysters. Eggs.  Dairy: Low-fat milk. Cheese. Greek yogurt.  Beverages: Water. Red wine. Herbal tea.  Fats and oils: Extra virgin olive oil. Avocado oil. Grape seed oil.  Sweets and desserts: [place] yogurt with honey. Baked apples. Poached pears. Trail mix.  Seasoning and other foods: Basil. Cilantro. Coriander. Cumin. Mint. Parsley. Sage. Rosemary. Tarragon. Garlic. Oregano. Thyme. Pepper. Balsalmic vinegar. Tahini. Hummus. Tomato sauce. Olives. Mushrooms. ? Limit these  Grains: Prepackaged pasta or rice dishes. Prepackaged cereal with added sugar.  Vegetables: Deep fried potatoes (french fries).  Fruits: Fruit canned in syrup.  Meats and other protein foods: Beef. Pork. Lamb. Poultry with skin. Hot dogs.  [place] Salines.  Dairy: Ice cream. Sour cream. Whole milk.  Beverages: Juice. Sugar-sweetened soft drinks. Beer. Liquor and spirits.  Fats and oils: Butter. Canola oil. Vegetable oil. Beef fat (tallow). Lard.  Sweets and desserts: Cookies. Cakes. Pies. Candy.  Seasoning and other foods: Mayonnaise. Premade sauces and marinades. The items listed may not be a complete list. Talk with your dietitian about what dietary choices are right for you. Summary  The Mediterranean diet includes both food and lifestyle choices.  Eat a variety of fresh fruits and vegetables, beans, nuts, seeds, and whole grains.  Limit the amount of red meat and sweets that you eat.  Talk with your health care provider about whether it is safe for you to drink red wine in moderation. This means 1 glass a day for nonpregnant women and 2 glasses a day for men. A glass of wine equals 5 oz (150 mL). This information is not intended to replace advice given to you by your health care provider. Make sure you discuss any questions you have with your health care provider. Document Revised: 07/15/2016 Document Reviewed: 07/08/2016 Elsevier Patient Education  2020 [employer].     Why follow it? Research shows. . Those who follow the Mediterranean diet have a reduced risk of heart disease  . The diet is associated with a reduced incidence of Parkinson's and Alzheimer's diseases . People following the diet may have longer life expectancies and lower rates of chronic diseases  .  The Dietary Guidelines for Americans recommends the Mediterranean diet as an eating plan to promote health and prevent disease  What Is the Mediterranean Diet?  . Healthy eating plan based on typical foods and recipes of Mediterranean-style cooking . The diet is primarily a plant based diet; these foods should make up a majority of meals   Starches - Plant based foods should make up a majority of meals - They are an important sources of vitamins, minerals,  energy, antioxidants, and fiber - Choose whole grains, foods high in fiber and minimally processed items  - Typical grain sources include wheat, oats, barley, corn, brown rice, bulgar, farro, millet, polenta, couscous  - Various types of beans include chickpeas, lentils, fava beans, black beans, white beans   Fruits  Veggies - Large quantities of antioxidant rich fruits & veggies; 6 or more servings  - Vegetables can be eaten raw or lightly drizzled with oil and cooked  - Vegetables common to the traditional Mediterranean Diet include: artichokes, arugula, beets, broccoli, brussel sprouts, cabbage, carrots, celery, collard greens, cucumbers, eggplant, kale, leeks, lemons, lettuce, mushrooms, okra, onions, peas, peppers, potatoes, pumpkin, radishes, rutabaga, shallots, spinach, sweet potatoes, turnips, zucchini - Fruits common to the Mediterranean Diet include: apples, apricots, avocados, cherries, clementines, dates, figs, grapefruits, grapes, melons, nectarines, oranges, peaches, pears, pomegranates, strawberries, tangerines  Fats - Replace butter and margarine with healthy oils, such as olive oil, canola oil, and tahini  - Limit nuts to no more than a handful a day  - Nuts include walnuts, almonds, pecans, pistachios, pine nuts  - Limit or avoid candied, honey roasted or heavily salted nuts - Olives are central to the [employer] - can be eaten whole or used in a variety of dishes   Meats Protein - Limiting red meat: no more than a few times a month - When eating red meat: choose lean cuts and keep the portion to the size of deck of cards - Eggs: approx. 0 to 4 times a week  - Fish and lean poultry: at least 2 a week  - Healthy protein sources include, chicken, [place], lean beef, lamb - Increase intake of seafood such as tuna, salmon, trout, mackerel, shrimp, scallops - Avoid or limit high fat processed meats such as sausage and bacon  Dairy - Include moderate amounts of low fat dairy  products  - Focus on healthy dairy such as fat free yogurt, skim milk, low or reduced fat cheese - Limit dairy products higher in fat such as whole or 2% milk, cheese, ice cream  Alcohol - Moderate amounts of red wine is ok  - No more than 5 oz daily for women (all ages) and men older than age 50  - No more than 10 oz of wine daily for men younger than 28  Other - Limit sweets and other desserts  - Use herbs and spices instead of salt to flavor foods  - Herbs and spices common to the traditional Mediterranean Diet include: basil, bay leaves, chives, cloves, cumin, fennel, garlic, lavender, marjoram, mint, oregano, parsley, pepper, rosemary, sage, savory, sumac, tarragon, thyme   It's not just a diet, it's a lifestyle:  . The Mediterranean diet includes lifestyle factors typical of those in the region  . Foods, drinks and meals are best eaten with others and savored . Daily physical activity is important for overall good health . This could be strenuous exercise like running and aerobics . This could also be more leisurely activities such as  walking, housework, yard-work, or taking the stairs . Moderation is the key; a balanced and healthy diet accommodates most foods and drinks . Consider portion sizes and frequency of consumption of certain foods   Meal Ideas & Options:  . Breakfast:  o Whole wheat toast or whole wheat English muffins with peanut butter & hard boiled egg o Steel cut oats topped with apples & cinnamon and skim milk  o Fresh fruit: banana, strawberries, melon, berries, peaches  o Smoothies: strawberries, bananas, greek yogurt, peanut butter o Low fat greek yogurt with blueberries and granola  o Egg white omelet with spinach and mushrooms o Breakfast couscous: whole wheat couscous, apricots, skim milk, cranberries  . Sandwiches:  o Hummus and grilled vegetables (peppers, zucchini, squash) on whole wheat bread   o Grilled chicken on whole wheat pita with lettuce, tomatoes,  cucumbers or tzatziki  o Tuna salad on whole wheat bread: tuna salad made with greek yogurt, olives, red peppers, capers, green onions o Garlic rosemary lamb pita: lamb sauted with garlic, rosemary, salt & pepper; add lettuce, cucumber, greek yogurt to pita - flavor with lemon juice and black pepper  . Seafood:  o Mediterranean grilled salmon, seasoned with garlic, basil, parsley, lemon juice and black pepper o Shrimp, lemon, and spinach whole-grain pasta salad made with low fat greek yogurt  o Seared scallops with lemon orzo  o Seared tuna steaks seasoned salt, pepper, coriander topped with tomato mixture of olives, tomatoes, olive oil, minced garlic, parsley, green onions and cappers  . Meats:  o Herbed greek chicken salad with kalamata olives, cucumber, feta  o Red bell peppers stuffed with spinach, bulgur, lean ground beef (or lentils) & topped with feta   o Kebabs: skewers of chicken, tomatoes, onions, zucchini, squash  o [place] burgers: made with red onions, mint, dill, lemon juice, feta cheese topped with roasted red peppers . Vegetarian o Cucumber salad: cucumbers, artichoke hearts, celery, red onion, feta cheese, tossed in olive oil & lemon juice  o Hummus and whole grain pita points with a greek salad (lettuce, tomato, feta, olives, cucumbers, red onion) o Lentil soup with celery, carrots made with vegetable broth, garlic, salt and pepper  o Tabouli salad: parsley, bulgur, mint, scallions, cucumbers, tomato, radishes, lemon juice, olive oil, salt and pepper.

## 2019-12-05 NOTE — Patient Instructions (Signed)
Medication Instructions:  Your physician recommends that you continue on your current medications as directed. Please refer to the Current Medication list given to you today.  *If you need a refill on your cardiac medications before your next appointment, please call your pharmacy*  Lab Work: NONE If you have labs (blood work) drawn today and your tests are completely normal, you will receive your results only by: Marland Kitchen MyChart Message (if you have MyChart) OR . A paper copy in the mail If you have any lab test that is abnormal or we need to change your treatment, we will call you to review the results.  Testing/Procedures: NONE  Follow-Up: At [facility], you and your health needs are our priority.  As part of our continuing mission to provide you with exceptional heart care, we have created designated Provider Care Teams.  These Care Teams include your primary Cardiologist (physician) and Advanced Practice Providers (APPs -  Physician Assistants and Nurse Practitioners) who all work together to provide you with the care you need, when you need it.  Your next appointment:   3-4 week(s)  The format for your next appointment:   Virtual Visit   Provider:   Cherlynn Kaiser, MD  Other Instructions  Mediterranean Diet A Mediterranean diet refers to food and lifestyle choices that are based on the traditions of countries located on the [place]. This way of eating has been shown to help prevent certain conditions and improve outcomes for people who have chronic diseases, like kidney disease and heart disease. What are tips for following this plan? Lifestyle  Cook and eat meals together with your family, when possible.  Drink enough fluid to keep your urine clear or pale yellow.  Be physically active every day. This includes: ? Aerobic exercise like running or swimming. ? Leisure activities like gardening, walking, or housework.  Get 7-8 hours of sleep each night.  If  recommended by your health care provider, drink red wine in moderation. This means 1 glass a day for nonpregnant women and 2 glasses a day for men. A glass of wine equals 5 oz (150 mL). Reading food labels   Check the serving size of packaged foods. For foods such as rice and pasta, the serving size refers to the amount of cooked product, not dry.  Check the total fat in packaged foods. Avoid foods that have saturated fat or trans fats.  Check the ingredients list for added sugars, such as corn syrup. Shopping  At the grocery store, buy most of your food from the areas near the walls of the store. This includes: ? Fresh fruits and vegetables (produce). ? Grains, beans, nuts, and seeds. Some of these may be available in unpackaged forms or large amounts (in bulk). ? Fresh seafood. ? Poultry and eggs. ? Low-fat dairy products.  Buy whole ingredients instead of prepackaged foods.  Buy fresh fruits and vegetables in-season from local farmers markets.  Buy frozen fruits and vegetables in resealable bags.  If you do not have access to quality fresh seafood, buy precooked frozen shrimp or canned fish, such as tuna, salmon, or sardines.  Buy small amounts of raw or cooked vegetables, salads, or olives from the deli or salad bar at your store.  Stock your pantry so you always have certain foods on hand, such as olive oil, canned tuna, canned tomatoes, rice, pasta, and beans. Cooking  Cook foods with extra-virgin olive oil instead of using butter or other vegetable oils.  Have meat  as a side dish, and have vegetables or grains as your main dish. This means having meat in small portions or adding small amounts of meat to foods like pasta or stew.  Use beans or vegetables instead of meat in common dishes like chili or lasagna.  Experiment with different cooking methods. Try roasting or broiling vegetables instead of steaming or sauteing them.  Add frozen vegetables to soups, stews, pasta,  or rice.  Add nuts or seeds for added healthy fat at each meal. You can add these to yogurt, salads, or vegetable dishes.  Marinate fish or vegetables using olive oil, lemon juice, garlic, and fresh herbs. Meal planning   Plan to eat 1 vegetarian meal one day each week. Try to work up to 2 vegetarian meals, if possible.  Eat seafood 2 or more times a week.  Have healthy snacks readily available, such as: ? Vegetable sticks with hummus. ? [place] yogurt. ? Fruit and nut trail mix.  Eat balanced meals throughout the week. This includes: ? Fruit: 2-3 servings a day ? Vegetables: 4-5 servings a day ? Low-fat dairy: 2 servings a day ? Fish, poultry, or lean meat: 1 serving a day ? Beans and legumes: 2 or more servings a week ? Nuts and seeds: 1-2 servings a day ? Whole grains: 6-8 servings a day ? Extra-virgin olive oil: 3-4 servings a day  Limit red meat and sweets to only a few servings a month What are my food choices?  Mediterranean diet ? Recommended  Grains: Whole-grain pasta. Brown rice. Bulgar wheat. Polenta. Couscous. Whole-wheat bread. [place].  Vegetables: Artichokes. Beets. Broccoli. Cabbage. Carrots. Eggplant. Green beans. Chard. Kale. Spinach. Onions. Leeks. Peas. Squash. Tomatoes. Peppers. Radishes.  Fruits: Apples. Apricots. Avocado. Berries. Bananas. Cherries. Dates. Figs. Grapes. Lemons. Melon. Oranges. Peaches. Plums. Pomegranate.  Meats and other protein foods: Beans. Almonds. Sunflower seeds. Pine nuts. Peanuts. [place]. Salmon. Scallops. Shrimp. [place]. Tilapia. Clams. Oysters. Eggs.  Dairy: Low-fat milk. Cheese. Greek yogurt.  Beverages: Water. Red wine. Herbal tea.  Fats and oils: Extra virgin olive oil. Avocado oil. Grape seed oil.  Sweets and desserts: [place] yogurt with honey. Baked apples. Poached pears. Trail mix.  Seasoning and other foods: Basil. Cilantro. Coriander. Cumin. Mint. Parsley. Sage. Rosemary. Tarragon. Garlic. Oregano. Thyme.  Pepper. Balsalmic vinegar. Tahini. Hummus. Tomato sauce. Olives. Mushrooms. ? Limit these  Grains: Prepackaged pasta or rice dishes. Prepackaged cereal with added sugar.  Vegetables: Deep fried potatoes (french fries).  Fruits: Fruit canned in syrup.  Meats and other protein foods: Beef. Pork. Lamb. Poultry with skin. Hot dogs. Berniece Salines.  Dairy: Ice cream. Sour cream. Whole milk.  Beverages: Juice. Sugar-sweetened soft drinks. Beer. Liquor and spirits.  Fats and oils: Butter. Canola oil. Vegetable oil. Beef fat (tallow). Lard.  Sweets and desserts: Cookies. Cakes. Pies. Candy.  Seasoning and other foods: Mayonnaise. Premade sauces and marinades. The items listed may not be a complete list. Talk with your dietitian about what dietary choices are right for you. Summary  The Mediterranean diet includes both food and lifestyle choices.  Eat a variety of fresh fruits and vegetables, beans, nuts, seeds, and whole grains.  Limit the amount of red meat and sweets that you eat.  Talk with your health care provider about whether it is safe for you to drink red wine in moderation. This means 1 glass a day for nonpregnant women and 2 glasses a day for men. A glass of wine equals 5 oz (150 mL). This information is  not intended to replace advice given to you by your health care provider. Make sure you discuss any questions you have with your health care provider. Document Revised: 07/15/2016 Document Reviewed: 07/08/2016 Elsevier Patient Education  2020 [employer].     Why follow it? Research shows. . Those who follow the Mediterranean diet have a reduced risk of heart disease  . The diet is associated with a reduced incidence of Parkinson's and Alzheimer's diseases . People following the diet may have longer life expectancies and lower rates of chronic diseases  . The Dietary Guidelines for Americans recommends the Mediterranean diet as an eating plan to promote health and prevent  disease  What Is the Mediterranean Diet?  . Healthy eating plan based on typical foods and recipes of Mediterranean-style cooking . The diet is primarily a plant based diet; these foods should make up a majority of meals   Starches - Plant based foods should make up a majority of meals - They are an important sources of vitamins, minerals, energy, antioxidants, and fiber - Choose whole grains, foods high in fiber and minimally processed items  - Typical grain sources include wheat, oats, barley, corn, brown rice, bulgar, farro, millet, polenta, couscous  - Various types of beans include chickpeas, lentils, fava beans, black beans, white beans   Fruits  Veggies - Large quantities of antioxidant rich fruits & veggies; 6 or more servings  - Vegetables can be eaten raw or lightly drizzled with oil and cooked  - Vegetables common to the traditional Mediterranean Diet include: artichokes, arugula, beets, broccoli, brussel sprouts, cabbage, carrots, celery, collard greens, cucumbers, eggplant, kale, leeks, lemons, lettuce, mushrooms, okra, onions, peas, peppers, potatoes, pumpkin, radishes, rutabaga, shallots, spinach, sweet potatoes, turnips, zucchini - Fruits common to the Mediterranean Diet include: apples, apricots, avocados, cherries, clementines, dates, figs, grapefruits, grapes, melons, nectarines, oranges, peaches, pears, pomegranates, strawberries, tangerines  Fats - Replace butter and margarine with healthy oils, such as olive oil, canola oil, and tahini  - Limit nuts to no more than a handful a day  - Nuts include walnuts, almonds, pecans, pistachios, pine nuts  - Limit or avoid candied, honey roasted or heavily salted nuts - Olives are central to the [employer] - can be eaten whole or used in a variety of dishes   Meats Protein - Limiting red meat: no more than a few times a month - When eating red meat: choose lean cuts and keep the portion to the size of deck of cards - Eggs:  approx. 0 to 4 times a week  - Fish and lean poultry: at least 2 a week  - Healthy protein sources include, chicken, [place], lean beef, lamb - Increase intake of seafood such as tuna, salmon, trout, mackerel, shrimp, scallops - Avoid or limit high fat processed meats such as sausage and bacon  Dairy - Include moderate amounts of low fat dairy products  - Focus on healthy dairy such as fat free yogurt, skim milk, low or reduced fat cheese - Limit dairy products higher in fat such as whole or 2% milk, cheese, ice cream  Alcohol - Moderate amounts of red wine is ok  - No more than 5 oz daily for women (all ages) and men older than age 64  - No more than 10 oz of wine daily for men younger than 95  Other - Limit sweets and other desserts  - Use herbs and spices instead of salt to flavor foods  - Herbs and  spices common to the traditional Mediterranean Diet include: basil, bay leaves, chives, cloves, cumin, fennel, garlic, lavender, marjoram, mint, oregano, parsley, pepper, rosemary, sage, savory, sumac, tarragon, thyme   It's not just a diet, it's a lifestyle:  . The Mediterranean diet includes lifestyle factors typical of those in the region  . Foods, drinks and meals are best eaten with others and savored . Daily physical activity is important for overall good health . This could be strenuous exercise like running and aerobics . This could also be more leisurely activities such as walking, housework, yard-work, or taking the stairs . Moderation is the key; a balanced and healthy diet accommodates most foods and drinks . Consider portion sizes and frequency of consumption of certain foods   Meal Ideas & Options:  . Breakfast:  o Whole wheat toast or whole wheat English muffins with peanut butter & hard boiled egg o Steel cut oats topped with apples & cinnamon and skim milk  o Fresh fruit: banana, strawberries, melon, berries, peaches  o Smoothies: strawberries, bananas, greek yogurt, peanut  butter o Low fat greek yogurt with blueberries and granola  o Egg white omelet with spinach and mushrooms o Breakfast couscous: whole wheat couscous, apricots, skim milk, cranberries  . Sandwiches:  o Hummus and grilled vegetables (peppers, zucchini, squash) on whole wheat bread   o Grilled chicken on whole wheat pita with lettuce, tomatoes, cucumbers or tzatziki  o Tuna salad on whole wheat bread: tuna salad made with greek yogurt, olives, red peppers, capers, green onions o Garlic rosemary lamb pita: lamb sauted with garlic, rosemary, salt & pepper; add lettuce, cucumber, greek yogurt to pita - flavor with lemon juice and black pepper  . Seafood:  o Mediterranean grilled salmon, seasoned with garlic, basil, parsley, lemon juice and black pepper o Shrimp, lemon, and spinach whole-grain pasta salad made with low fat greek yogurt  o Seared scallops with lemon orzo  o Seared tuna steaks seasoned salt, pepper, coriander topped with tomato mixture of olives, tomatoes, olive oil, minced garlic, parsley, green onions and cappers  . Meats:  o Herbed greek chicken salad with kalamata olives, cucumber, feta  o Red bell peppers stuffed with spinach, bulgur, lean ground beef (or lentils) & topped with feta   o Kebabs: skewers of chicken, tomatoes, onions, zucchini, squash  o [place] burgers: made with red onions, mint, dill, lemon juice, feta cheese topped with roasted red peppers . Vegetarian o Cucumber salad: cucumbers, artichoke hearts, celery, red onion, feta cheese, tossed in olive oil & lemon juice  o Hummus and whole grain pita points with a greek salad (lettuce, tomato, feta, olives, cucumbers, red onion) o Lentil soup with celery, carrots made with vegetable broth, garlic, salt and pepper  o Tabouli salad: parsley, bulgur, mint, scallions, cucumbers, tomato, radishes, lemon juice, olive oil, salt and pepper.

## 2019-12-17 ENCOUNTER — Other Ambulatory Visit: Payer: Self-pay

## 2019-12-17 ENCOUNTER — Ambulatory Visit: Payer: BC Managed Care – PPO | Attending: Internal Medicine

## 2019-12-17 DIAGNOSIS — R2689 Other abnormalities of gait and mobility: Secondary | ICD-10-CM | POA: Insufficient documentation

## 2019-12-17 DIAGNOSIS — M6281 Muscle weakness (generalized): Secondary | ICD-10-CM | POA: Diagnosis present

## 2019-12-17 NOTE — Therapy (Signed)
[facility] [address] [place], [place], [address] Phone: [phone]   Fax:  [phone]  Physical Therapy Evaluation  Patient Details  Name: Audrey Fischer MRN: [id number] Date of Birth: [birth year] Referring Provider (PT): Randall Hiss [place] but was hopsitalist. Will follow up with Frann Rider   Encounter Date: 12/17/2019  PT End of Session - 12/17/19 1116    Visit Number  1    Number of Visits  6    Date for PT Re-Evaluation  01/16/20    PT Start Time  1110    PT Stop Time  1149    PT Time Calculation (min)  39 min       Past Medical History:  Diagnosis Date  . Breast cancer ([place]) 07/09/15   left breast,  lower-inner quadrant   . Coronary artery disease    cardiologist-  dr Donnetta Hutching [facility] regional physicians, [facility] cardiology in [facility])---  hx PCI w/ stents to OM and LAD 02-08-2008  . Hyperlipidemia   . Hypertension   . PMB (postmenopausal bleeding)   . S/P coronary artery stent placement     Past Surgical History:  Procedure Laterality Date  . CESAREAN SECTION  10-09-1996  . CORONARY ANGIOPLASTY WITH STENT PLACEMENT  02-08-2008   dr Donnetta Hutching   PCI and stent to OM and LAD  . DILATATION & CURRETTAGE/HYSTEROSCOPY WITH RESECTOCOPE N/A 08/15/2015   Procedure: DILATATION & CURETTAGE/HYSTEROSCOPY ;  Surgeon: Eldred Manges, MD;  Location: [place];  Service: Gynecology;  Laterality: N/A;  . DILATION AND CURETTAGE OF UTERUS  1994   blighted ovum    There were no vitals filed for this visit.   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 Pt reports that she couldn't walk well when got up having to hold on to stuff and fingers were tingling. She just felt really unsteady. Pt denies any dizzziness or unsteadiness currently but does not feel normal still. Pt does feel like she may sway a little with head down.    Pertinent  History  breast cancer s/p radiation and surgery in remission 4 years now, CAD s/p PCI, HTN, hyperlipidemia, new DM diagnosis    Patient Stated Goals  Pt would like her fingers to be less tingling and be able to do the running man again.    Currently in Pain?  No/denies         [facility] PT Assessment - 12/17/19 1116      Assessment   Medical Diagnosis  left posterior external capsule infarction    Referring Provider (PT)  Randall Hiss [place] but was hopsitalist. Will follow up with Frann Rider    Onset Date/Surgical Date  11/12/19    Hand Dominance  Right    Prior Therapy  no      Balance Screen   Has the patient fallen in the past 6 months  Yes   in hopsital was leaning over trying to clean up pee   How many times?  2   went to step over clothes basket 2 weeks ago and in hospital   Has the patient had a decrease in activity level because of a fear of falling?   No    Is the patient reluctant to leave their home because of a fear of falling?   No      Home Film/video editor residence  Living Arrangements  Children   grown   Available Help at Discharge  Family    Type of [place] to enter    Entrance Stairs-Number of Steps  4    Entrance Stairs-Rails  Right;Left;Can reach both    [place]  Two level    Alternate Level Stairs-Number of Steps  12   bedroom and bathroom downstairs.   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 Was numb initially. Reports writing is much better and has been using hand as much as possible.      Coordination   Gross Motor Movements are Fluid and Coordinated  Yes   intact RAMs   Fine Motor Movements are Fluid and Coordinated  Yes   intact finger opposition     ROM / Strength   AROM / PROM / Strength  Strength      Strength   Strength Assessment Site  Shoulder;Elbow;Hand;Hip;Knee;Ankle    Right/Left Shoulder  Right;Left    Right Shoulder Flexion  5/5    Left Shoulder Flexion  5/5    Right/Left Elbow  Right;Left    Right Elbow Flexion  5/5    Right Elbow Extension  5/5    Left Elbow Flexion  5/5    Left Elbow Extension  5/5    Right/Left hand  Right;Left    Right Hand Gross Grasp  Functional    Left Hand Gross Grasp  Functional    Right/Left Hip  Right;Left    Right Hip Flexion  5/5    Right Hip ADduction  5/5    Left Hip Flexion  5/5    Left Hip ABduction  5/5    Right/Left Knee  Right;Left    Right Knee Flexion  4/5    Right Knee Extension  5/5    Left Knee Flexion  5/5    Left Knee Extension  5/5    Right/Left Ankle  Right;Left    Right Ankle Dorsiflexion  5/5    Left Ankle Dorsiflexion  5/5      Transfers   Transfers  Sit to Stand;Stand to Sit    Sit to Stand  7: Independent    Stand to Sit  7: Independent      Ambulation/Gait   Ambulation/Gait  Yes    Ambulation/Gait Assistance  7: Independent    Ambulation Distance (Feet)  200 Feet    Assistive device  None    Gait Pattern  Step-through pattern;Decreased hip/knee flexion - right    Ambulation Surface  Level;Indoor    Gait velocity  10.03 comfortable=0.69m/s and 7.83 fast=1.22 m/s    Stairs  Yes    Stairs Assistance  6: Modified independent (Device/Increase time)    Stair Management Technique  One rail Right;Alternating pattern    Number of Stairs  4  Gait Comments  Pt with 2 episodes of decreased right foot clearance with gait with scuffing foot.      High Level Balance   High  Level Balance Comments  left SLS 6 sec, right SLS 12 sec but decreased stability.      Functional Gait  Assessment   Gait assessed   Yes    Gait Level Surface  Walks 20 ft in less than 5.5 sec, no assistive devices, good speed, no evidence for imbalance, normal gait pattern, deviates no more than 6 in outside of the 12 in walkway width.    Change in Gait Speed  Able to smoothly change walking speed without loss of balance or gait deviation. Deviate no more than 6 in outside of the 12 in walkway width.    Gait with Horizontal Head Turns  Performs head turns smoothly with no change in gait. Deviates no more than 6 in outside 12 in walkway width    Gait with Vertical Head Turns  Performs head turns with no change in gait. Deviates no more than 6 in outside 12 in walkway width.    Gait and Pivot Turn  Pivot turns safely within 3 sec and stops quickly with no loss of balance.    Step Over Obstacle  Is able to step over one shoe box (4.5 in total height) without changing gait speed. No evidence of imbalance.    Gait with Narrow Base of Support  Is able to ambulate for 10 steps heel to toe with no staggering.    Gait with Eyes Closed  Walks 20 ft, no assistive devices, good speed, no evidence of imbalance, normal gait pattern, deviates no more than 6 in outside 12 in walkway width. Ambulates 20 ft in less than 7 sec.    Ambulating Backwards  Walks 20 ft, uses assistive device, slower speed, mild gait deviations, deviates 6-10 in outside 12 in walkway width.    Steps  Alternating feet, must use rail.    Total Score  27                Objective measurements completed on examination: See above findings.              PT Education - 12/17/19 1422    Education Details  Educated on PT plan of care and initial HEP    Person(s) Educated  Patient    Methods  Explanation    Comprehension  Verbalized understanding       PT Short Term Goals - 12/17/19 1435      PT SHORT TERM GOAL #1    Title  STG=LTGs        PT Long Term Goals - 12/17/19 1436      PT LONG TERM GOAL #1   Title  Pt will be independent with HEP for high level balance, strengthening and walking program to continue gains on own.    Time  4    Period  Weeks    Status  New    Target Date  01/16/20      PT LONG TERM GOAL #2   Title  Pt will increase comfortable gait speed to >1.3m/s for normal community ambulation speed.    Baseline  0.1m/s    Time  4    Period  Weeks    Status  New    Target Date  01/16/20      PT LONG TERM GOAL #3   Title  Pt will be able  to maintain SLS >15 sec bilateral LE for improved balance.    Baseline  6 sec on left, 12 sec on right but very unsteady    Time  4    Period  Weeks    Status  New    Target Date  01/16/20      PT LONG TERM GOAL #4   Title  Pt will ambulate >500' on varied surfaces independently with no significant deficits in gait quality for improved ambulation.    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 Pt reports much improvement since then. Pt ambulating at gait speed appropriate for community ambulator but slightly less than normal at 0.49m/s. Pt low fall risk based on FGA testing. PT did note some times of decreased right foot clearance with gait with right hamstring strength 4/5. Pt challenged with high level balance task of SLS. Pt will benefit from skilled PT to address right LE strengthening and high level balance to maximize return to prior level.    Personal Factors and Comorbidities  Comorbidity 3+    Comorbidities  breast cancer s/p radiation and surgery in remission 4 years now, CAD s/p PCI, HTN, hyperlipidemia, new DM diagnosis    Examination-Activity Limitations  Locomotion Level    Examination-Participation Restrictions  Community Activity     Stability/Clinical Decision Making  Stable/Uncomplicated    Clinical Decision Making  Low    Rehab Potential  Good    PT Frequency  2x / week   followed by 1x/week for 2 weeks   PT Duration  2 weeks    PT Treatment/Interventions  ADLs/Self Care Home Management;Gait training;Stair training;Functional mobility training;Therapeutic activities;Therapeutic exercise;Patient/family education;Neuromuscular re-education;Balance training;Manual techniques    PT Next Visit Plan  How is initial HEP going? Gait training on varied surfaces without AD, high level balance including SLS, right LE strengthening with focus on hamstring.    Consulted and Agree with Plan of Care  Patient       Patient will benefit from skilled therapeutic intervention in order to improve the following deficits and impairments:  Abnormal gait, Decreased balance  Visit Diagnosis: Other abnormalities of gait and mobility  Muscle weakness (generalized)     Problem List Patient Active Problem List   Diagnosis Date Noted  . Newly diagnosed diabetes ([place]) 11/19/2019  . Hx of completed stroke 11/12/2019  . Dizziness 01/03/2019  . Coronary artery disease due to lipid rich plaque 01/03/2019  . Sialoadenitis 04/04/2018  . Cough 02/21/2018  . Hematuria 08/11/2017  . Malignant neoplasm of overlapping sites of left breast in female, estrogen receptor positive ([place]) 03/28/2017  . Hearing loss due to cerumen impaction, right 12/23/2016  . Routine general medical examination at a health care facility 10/02/2016  . Unexplained weight gain 06/17/2016  . Hyperlipidemia associated with type 2 diabetes mellitus ([place]) 08/27/2015  . PMB (postmenopausal bleeding) 08/14/2015  . Fibroids 08/14/2015  . Cervical stenosis (uterine cervix) 08/14/2015  . Thickened endometrium 08/14/2015  . HTN (hypertension) 06/02/2013    Electa Sniff, PT, DPT, NCS 12/17/2019, 2:42 PM  [facility] [address] [place], [place], [address] Phone: [phone]   Fax:  [phone]  Name: Charlese  HAZYL OSTEEN MRN: [id number] Date of Birth: November 08, 1956

## 2019-12-17 NOTE — Patient Instructions (Signed)
Access Code: [email or web address]  URL: [email or web address]  Date: 12/17/2019  Prepared by: Cherly Anderson   Exercises Single Leg Stance - 3 reps - 1 sets - 30 hold - 2x daily - 7x weekly Walking Tandem Stance - 4 reps - 1 sets - 2x daily - 7x weekly Heel Walking - 4 reps - 1 sets - 2x daily - 7x weekly Toe Walking - 4 reps - 1 sets - 2x daily - 7x weekly Walking March - 4 reps - 1 sets - 2x daily - 7x weekly

## 2019-12-21 ENCOUNTER — Ambulatory Visit: Payer: BC Managed Care – PPO

## 2019-12-21 ENCOUNTER — Other Ambulatory Visit: Payer: Self-pay

## 2019-12-21 ENCOUNTER — Ambulatory Visit: Payer: BC Managed Care – PPO | Admitting: Internal Medicine

## 2019-12-21 VITALS — BP 118/78

## 2019-12-21 DIAGNOSIS — M6281 Muscle weakness (generalized): Secondary | ICD-10-CM

## 2019-12-21 DIAGNOSIS — R2689 Other abnormalities of gait and mobility: Secondary | ICD-10-CM

## 2019-12-21 NOTE — Patient Instructions (Addendum)
Access Code: [email or web address]  URL: [email or web address]  Date: 12/21/2019  Prepared by: Cherly Anderson   Exercises Single Leg Stance - 3 reps - 1 sets - 30 hold - 2x daily - 7x weekly Walking Tandem Stance - 4 reps - 1 sets - 2x daily - 7x weekly Heel Walking - 4 reps - 1 sets - 2x daily - 7x weekly Toe Walking - 4 reps - 1 sets - 2x daily - 7x weekly Walking March - 4 reps - 1 sets - 2x daily - 7x weekly Side Stepping with Resistance at Ankles - 4 reps - 1 sets - 2x daily - 7x weekly Standing Hamstring Curl with Resistance - 10 reps - 3 sets - 1x daily - 5x weekly

## 2019-12-21 NOTE — Therapy (Signed)
[facility] [address] [place], [place], [address] Phone: [phone]   Fax:  [phone]  Physical Therapy Treatment  Patient Details  Name: Audrey Fischer MRN: [id number] Date of Birth: [birth year] Referring Provider (PT): Randall Hiss [place] but was hopsitalist. Will follow up with Frann Rider   Encounter Date: 12/21/2019  PT End of Session - 12/21/19 0725    Visit Number  2    Number of Visits  6    Date for PT Re-Evaluation  01/16/20    PT Start Time  0722    PT Stop Time  0800    PT Time Calculation (min)  38 min       Past Medical History:  Diagnosis Date  . Breast cancer ([place]) 07/09/15   left breast,  lower-inner quadrant   . Coronary artery disease    cardiologist-  dr Donnetta Hutching [facility] regional physicians, [facility] cardiology in [facility])---  hx PCI w/ stents to OM and LAD 02-08-2008  . Hyperlipidemia   . Hypertension   . PMB (postmenopausal bleeding)   . S/P coronary artery stent placement     Past Surgical History:  Procedure Laterality Date  . CESAREAN SECTION  10-09-1996  . CORONARY ANGIOPLASTY WITH STENT PLACEMENT  02-08-2008   dr Donnetta Hutching   PCI and stent to OM and LAD  . DILATATION & CURRETTAGE/HYSTEROSCOPY WITH RESECTOCOPE N/A 08/15/2015   Procedure: DILATATION & CURETTAGE/HYSTEROSCOPY ;  Surgeon: Eldred Manges, MD;  Location: [facility];  Service: Gynecology;  Laterality: N/A;  . DILATION AND CURETTAGE OF UTERUS  1994   blighted ovum    Vitals:   12/21/19 0727  BP: 118/78    Subjective Assessment - 12/21/19 0725    Subjective  Pt reports that exercises are going well.    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    Currently in Pain?  No/denies                        [facility] Adult PT Treatment/Exercise - 12/21/19 0753      Ambulation/Gait   Ambulation/Gait  Yes    Ambulation/Gait Assistance  7: Independent    Ambulation Distance (Feet)  460 Feet    Assistive device  None    Gait Pattern  Step-through pattern;Decreased hip/knee flexion - left    Ambulation Surface  Level;Indoor    Ramp  7: Independent    Curb  7: Independent    Gait Comments  Pt did give patient verbal cues to try to increase step length and be sure to use heel strike throughout.      Neuro Re-ed    Neuro Re-ed Details   Pt performed along counter: marching gait 6' x 6 with verbal cues to control SLS time, toe walking 6' x 6, heel walking 6' x 6, reciprocal steps over 4 cones with tapping cone prior to stepping over for increased SLS time. Pt touched counter a couple times for support, side stepping over 4 cones without support x 3 laps, tandem gait 6' x 6. Marching gait over mat 6' x 4 without UE suppport. Supervision with activities but patient able to self correct throughout.      Exercises  Exercises  Other Exercises    Other Exercises   Standing on mat right hamstring curl 10 x 2 with 2# ankle weight. Side stepping with green theraband around legs with squat x 5 each side with verbal cues for form. BP after exercises and walking=122/82             PT Education - 12/21/19 1114    Education Details  Added to HEP    Person(s) Educated  Patient    Methods  Explanation;Demonstration;Handout    Comprehension  Verbalized understanding;Returned demonstration       PT Short Term Goals - 12/17/19 1435      PT SHORT TERM GOAL #1   Title  STG=LTGs        PT Long Term Goals - 12/17/19 1436      PT LONG TERM GOAL #1   Title  Pt will be independent with HEP for high level balance, strengthening and walking program to continue gains on own.    Time  4    Period  Weeks    Status  New    Target Date  01/16/20      PT LONG TERM GOAL #2    Title  Pt will increase comfortable gait speed to >1.6m/s for normal community ambulation speed.    Baseline  0.50m/s    Time  4    Period  Weeks    Status  New    Target Date  01/16/20      PT LONG TERM GOAL #3   Title  Pt will be able to maintain SLS >15 sec bilateral LE for improved balance.    Baseline  6 sec on left, 12 sec on right but very unsteady    Time  4    Period  Weeks    Status  New    Target Date  01/16/20      PT LONG TERM GOAL #4   Title  Pt will ambulate >500' on varied surfaces independently with no significant deficits in gait quality for improved ambulation.    Time  4    Period  Weeks    Status  New    Target Date  01/16/20            Plan - 12/21/19 1114    Clinical Impression Statement  Pt showed improved control with heel/toe walking today. Did well with marching so added activities for more SLS which did challenge her some. Pt continues to benefit from PT for high level balance, gait and right hamstring strengthening.    Personal Factors and Comorbidities  Comorbidity 3+    Comorbidities  breast cancer s/p radiation and surgery in remission 4 years now, CAD s/p PCI, HTN, hyperlipidemia, new DM diagnosis    Examination-Activity Limitations  Locomotion Level    Examination-Participation Restrictions  Community Activity    Stability/Clinical Decision Making  Stable/Uncomplicated    Rehab Potential  Good    PT Frequency  2x / week   followed by 1x/week for 2 weeks   PT Duration  2 weeks    PT Treatment/Interventions  ADLs/Self Care Home Management;Gait training;Stair training;Functional mobility training;Therapeutic activities;Therapeutic exercise;Patient/family education;Neuromuscular re-education;Balance training;Manual techniques    PT Next Visit Plan  Try more dynamic gait activities next session. Gait training on varied surfaces without AD, high level balance including SLS, right LE strengthening with focus on hamstring.    Consulted and Agree  with Plan of Care  Patient  Patient will benefit from skilled therapeutic intervention in order to improve the following deficits and impairments:  Abnormal gait, Decreased balance  Visit Diagnosis: Other abnormalities of gait and mobility  Muscle weakness (generalized)     Problem List Patient Active Problem List   Diagnosis Date Noted  . Newly diagnosed diabetes ([place]) 11/19/2019  . Hx of completed stroke 11/12/2019  . Dizziness 01/03/2019  . Coronary artery disease due to lipid rich plaque 01/03/2019  . Sialoadenitis 04/04/2018  . Cough 02/21/2018  . Hematuria 08/11/2017  . Malignant neoplasm of overlapping sites of left breast in female, estrogen receptor positive ([place]) 03/28/2017  . Hearing loss due to cerumen impaction, right 12/23/2016  . Routine general medical examination at a health care facility 10/02/2016  . Unexplained weight gain 06/17/2016  . Hyperlipidemia associated with type 2 diabetes mellitus ([place]) 08/27/2015  . PMB (postmenopausal bleeding) 08/14/2015  . Fibroids 08/14/2015  . Cervical stenosis (uterine cervix) 08/14/2015  . Thickened endometrium 08/14/2015  . HTN (hypertension) 06/02/2013    Electa Sniff, PT, DPT, NCS 12/21/2019, 11:17 AM  [facility] [address] [place], [place], [address] Phone: [phone]   Fax:  [phone]  Name: Audrey Fischer MRN: [id number] Date of Birth: 1956/03/17

## 2019-12-24 ENCOUNTER — Ambulatory Visit: Payer: BC Managed Care – PPO | Admitting: Internal Medicine

## 2019-12-24 ENCOUNTER — Encounter: Payer: Self-pay | Admitting: Internal Medicine

## 2019-12-24 ENCOUNTER — Other Ambulatory Visit: Payer: Self-pay

## 2019-12-24 VITALS — BP 126/84 | HR 62 | Temp 98.1°F | Ht 68.5 in | Wt 197.0 lb

## 2019-12-24 DIAGNOSIS — Z8673 Personal history of transient ischemic attack (TIA), and cerebral infarction without residual deficits: Secondary | ICD-10-CM

## 2019-12-24 DIAGNOSIS — E1169 Type 2 diabetes mellitus with other specified complication: Secondary | ICD-10-CM

## 2019-12-24 DIAGNOSIS — E119 Type 2 diabetes mellitus without complications: Secondary | ICD-10-CM

## 2019-12-24 DIAGNOSIS — E785 Hyperlipidemia, unspecified: Secondary | ICD-10-CM | POA: Diagnosis not present

## 2019-12-24 DIAGNOSIS — I10 Essential (primary) hypertension: Secondary | ICD-10-CM

## 2019-12-24 LAB — CBC
HCT: 39.4 % (ref 36.0–46.0)
Hemoglobin: 13.4 g/dL (ref 12.0–15.0)
MCHC: 34 g/dL (ref 30.0–36.0)
MCV: 82.4 fl (ref 78.0–100.0)
Platelets: 262 10*3/uL (ref 150.0–400.0)
RBC: 4.78 Mil/uL (ref 3.87–5.11)
RDW: 14 % (ref 11.5–15.5)
WBC: 6.9 10*3/uL (ref 4.0–10.5)

## 2019-12-24 LAB — LIPID PANEL
Cholesterol: 127 mg/dL (ref 0–200)
HDL: 37.8 mg/dL — ABNORMAL LOW (ref >39.00)
LDL Cholesterol: 59 mg/dL (ref 0–99)
NonHDL: 89.02
Total CHOL/HDL Ratio: 3
Triglycerides: 150 mg/dL — ABNORMAL HIGH (ref 0.0–149.0)
VLDL: 30 mg/dL (ref 0.0–40.0)

## 2019-12-24 LAB — COMPREHENSIVE METABOLIC PANEL
ALT: 24 U/L (ref 0–35)
AST: 18 U/L (ref 0–37)
Albumin: 4.8 g/dL (ref 3.5–5.2)
Alkaline Phosphatase: 74 U/L (ref 39–117)
BUN: 25 mg/dL — ABNORMAL HIGH (ref 6–23)
CO2: 22 mEq/L (ref 19–32)
Calcium: 10.4 mg/dL (ref 8.4–10.5)
Chloride: 103 mEq/L (ref 96–112)
Creatinine, Ser: 1.87 mg/dL — ABNORMAL HIGH (ref 0.40–1.20)
GFR: 33.09 mL/min — ABNORMAL LOW (ref >60.00)
Glucose, Bld: 134 mg/dL — ABNORMAL HIGH (ref 70–99)
Potassium: 3.8 mEq/L (ref 3.5–5.1)
Sodium: 136 mEq/L (ref 135–145)
Total Bilirubin: 0.5 mg/dL (ref 0.2–1.2)
Total Protein: 8.2 g/dL (ref 6.0–8.3)

## 2019-12-24 NOTE — Progress Notes (Signed)
   Subjective:   Patient ID: Audrey Fischer, female    DOB: [birth year], 64 y.o.   MRN: [id number]  HPI The patient is a 64 YO female coming in for concerns about follow up with her stroke (doing well with PT, still some slight numbness right hand, overall improving well, back to work) and blood pressure (we had left regimen same last time as she was still working on diet and exercise changes, taking atenolol and BP at goal now, denies headaches or chest pains or new stroke symptoms), and diabetes (started metformin 1 pill daily, some stomach concerns initially but doing well with this now, denies low sugars, has been making changes to diet).   Review of Systems  Constitutional: Negative.   HENT: Negative.   Eyes: Negative.   Respiratory: Negative for cough, chest tightness and shortness of breath.   Cardiovascular: Negative for chest pain, palpitations and leg swelling.  Gastrointestinal: Negative for abdominal distention, abdominal pain, constipation, diarrhea, nausea and vomiting.  Musculoskeletal: Negative.   Skin: Negative.   Neurological: Positive for numbness.  Psychiatric/Behavioral: Negative.     Objective:  Physical Exam Constitutional:      Appearance: She is well-developed.  HENT:     Head: Normocephalic and atraumatic.  Cardiovascular:     Rate and Rhythm: Normal rate and regular rhythm.  Pulmonary:     Effort: Pulmonary effort is normal. No respiratory distress.     Breath sounds: Normal breath sounds. No wheezing or rales.  Abdominal:     General: Bowel sounds are normal. There is no distension.     Palpations: Abdomen is soft.     Tenderness: There is no abdominal tenderness. There is no rebound.  Musculoskeletal:     Cervical back: Normal range of motion.  Skin:    General: Skin is warm and dry.  Neurological:     Mental Status: She is alert and oriented to person, place, and time.     Cranial Nerves: Cranial nerve deficit present.     Coordination: Coordination  normal.     Comments: Improving, numbness right hand     Vitals:   12/24/19 1532  BP: 126/84  Pulse: 62  Temp: 98.1 F (36.7 C)  TempSrc: Oral  SpO2: 99%  Weight: 197 lb (89.4 kg)  Height: 5' 8.5" (1.74 m)    This visit occurred during the SARS-CoV-2 public health emergency.  Safety protocols were in place, including screening questions prior to the visit, additional usage of staff PPE, and extensive cleaning of exam room while observing appropriate contact time as indicated for disinfecting solutions.   Assessment & Plan:

## 2019-12-24 NOTE — Patient Instructions (Signed)
We will check the blood work today. ° ° °

## 2019-12-25 ENCOUNTER — Ambulatory Visit: Payer: BC Managed Care – PPO | Admitting: Adult Health

## 2019-12-25 ENCOUNTER — Other Ambulatory Visit: Payer: Self-pay

## 2019-12-25 ENCOUNTER — Encounter: Payer: Self-pay | Admitting: Adult Health

## 2019-12-25 VITALS — BP 138/84 | HR 71 | Temp 96.2°F | Ht 68.5 in | Wt 197.0 lb

## 2019-12-25 DIAGNOSIS — I6381 Other cerebral infarction due to occlusion or stenosis of small artery: Secondary | ICD-10-CM

## 2019-12-25 DIAGNOSIS — I10 Essential (primary) hypertension: Secondary | ICD-10-CM | POA: Diagnosis not present

## 2019-12-25 DIAGNOSIS — I69398 Other sequelae of cerebral infarction: Secondary | ICD-10-CM

## 2019-12-25 DIAGNOSIS — E119 Type 2 diabetes mellitus without complications: Secondary | ICD-10-CM

## 2019-12-25 DIAGNOSIS — E785 Hyperlipidemia, unspecified: Secondary | ICD-10-CM | POA: Diagnosis not present

## 2019-12-25 DIAGNOSIS — R2689 Other abnormalities of gait and mobility: Secondary | ICD-10-CM

## 2019-12-25 NOTE — Patient Instructions (Signed)
Continue aspirin 81 mg daily  and Crestor for secondary stroke prevention  Continue to follow up with PCP regarding cholesterol, blood pressure and diabetes management   Continue to follow with cardiology as scheduled  Continue to monitor blood pressure at home  Maintain strict control of hypertension with blood pressure goal below 130/90, diabetes with hemoglobin A1c goal below 6.5% and cholesterol with LDL cholesterol (bad cholesterol) goal below 70 mg/dL. I also advised the patient to eat a healthy diet with plenty of whole grains, cereals, fruits and vegetables, exercise regularly and maintain ideal body weight.  Followup in the future with me in 3 months or call earlier if needed       Thank you for coming to see [place] at [facility]. I hope we have been able to provide you high quality care today.  You may receive a patient satisfaction survey over the next few weeks. We would appreciate your feedback and comments so that we may continue to improve ourselves and the health of our patients.

## 2019-12-25 NOTE — Progress Notes (Signed)
I agree with the above plan 

## 2019-12-25 NOTE — Progress Notes (Signed)
[facility] [address] [place]. [place] [address] [phone]       HOSPITAL FOLLOW UP NOTE  Ms. Scottlyn DESTANIE LUPU Date of Birth:  1955-12-31 Medical Record Number:  [id number]   Reason for Referral:  hospital stroke follow up    CHIEF COMPLAINT:  Chief Complaint  Patient presents with  . Hospitalization Follow-up    Alone. Rm 9. No new concerns at this time.     HPI: Greer T Brittis being seen today for in office hospital follow-up regarding left AchA territory small lacunar infarct likely secondary to small vessel disease source on 11/13/2019.  History obtained from patient and chart review. Reviewed all radiology images and labs personally.  Ms. Yesha KEMORAH AMELL is a 64 y.o. female with history of CAD with NSTEMI s/p stent 01/2008, HTN, HLD, breast Cancer in 2016 now on anastrozole admitted on 11/13/2019 for right sided weakness and right hand numbness.  Evaluated by Dr. Erlinda Hong and stroke team with stroke work-up revealing left AchA territory small lacunar infarct (left PLIC and CR infarct) as evidenced on MRI secondary to small vessel disease source.  CTA head/neck showed right M1, left M2 and M3 stenosis.  2D echo showed an EF of 60 to 65%.  Recommended DAPT for 3 weeks and aspirin alone.  New diagnosis of uncontrolled DM with A1c 9.7 and recommended close PCP follow-up for better DM control.  HTN stable on the high side with long-term BP goal normotensive range.  LDL 202 and initiated Crestor 40 mg daily.  Other stroke risk factors include EtOH use, obesity and CAD status post stenting.  Other active problems include breast cancer 2016 status post sx and rx, now on anastrozole.  No prior history of stroke.  Evaluated by therapies and recommended discharge home with outpatient PT/OT with residual right hand dexterity difficulty.  Ms. Werre is a 64 year old female who is being seen today for hospital follow-up.  Residual stroke deficits of mild right and pointer finger numbness  and mild imbalance but overall greatly recovering.  She continues to work with PT for ongoing benefit of high-level balance, gait and right hamstring strengthening.  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  She has completed 3 weeks DAPT and continues on aspirin alone without bleeding or bruising.  Continues on Crestor 40 mg daily without myalgias.  Lipid panel obtained yesterday with PCP which showed LDL 59 and triglycerides decreased from 2 64 to 1 50.  Blood pressure today satisfactory at 138/84.  She continues on Metformin 500 mg daily for DM management and ongoing follow-up with PCP.  She does have a follow-up visit with PCP in 3 months and plans on obtaining A1c level at that time.  She does report initially feeling fatigued but this has since improved.  She will occasionally have insomnia but she feels as though this is typically with increased stress or anxiety.  Denies snoring or witnessed apneic events.  No further concerns at this time.    ROS:   14 system review of systems performed and negative with exception of numbness/tingling  PMH:  Past Medical History:  Diagnosis Date  . Breast cancer ([place]) 07/09/15   left breast,  lower-inner quadrant   . Coronary artery disease    cardiologist-  dr Donnetta Hutching [facility] regional physicians, [facility] cardiology in [facility])---  hx PCI w/ stents to OM and LAD 02-08-2008  . Hyperlipidemia   . Hypertension   . PMB (postmenopausal  bleeding)   . S/P coronary artery stent placement     PSH:  Past Surgical History:  Procedure Laterality Date  . CESAREAN SECTION  10-09-1996  . CORONARY ANGIOPLASTY WITH STENT PLACEMENT  02-08-2008   dr Donnetta Hutching   PCI and stent to OM and LAD  . DILATATION & CURRETTAGE/HYSTEROSCOPY WITH RESECTOCOPE N/A 08/15/2015   Procedure: DILATATION & CURETTAGE/HYSTEROSCOPY ;  Surgeon: Eldred Manges, MD;  Location: [place];  Service: Gynecology;  Laterality: N/A;  . DILATION AND  CURETTAGE OF UTERUS  1994   blighted ovum    Social History:  Social History   Socioeconomic History  . Marital status: Single    Spouse name: Not on file  . Number of children: Not on file  . Years of education: Not on file  . Highest education level: Not on file  Occupational History  . Not on file  Tobacco Use  . Smoking status: Never Smoker  . Smokeless tobacco: Never Used  Substance and Sexual Activity  . Alcohol use: Yes    Alcohol/week: 0.0 standard drinks    Comment: occasionally  . Drug use: No  . Sexual activity: Not on file  Other Topics Concern  . Not on file  Social History Narrative  . Not on file   Social Determinants of Health   Financial Resource Strain:   . Difficulty of Paying Living Expenses: Not on file  Food Insecurity:   . Worried About Charity fundraiser in the Last Year: Not on file  . Ran Out of Food in the Last Year: Not on file  Transportation Needs:   . Lack of Transportation (Medical): Not on file  . Lack of Transportation (Non-Medical): Not on file  Physical Activity:   . Days of Exercise per Week: Not on file  . Minutes of Exercise per Session: Not on file  Stress:   . Feeling of Stress : Not on file  Social Connections:   . Frequency of Communication with Friends and Family: Not on file  . Frequency of Social Gatherings with Friends and Family: Not on file  . Attends Religious Services: Not on file  . Active Member of Clubs or Organizations: Not on file  . Attends Archivist Meetings: Not on file  . Marital Status: Not on file  Intimate Partner Violence:   . Fear of Current or Ex-Partner: Not on file  . Emotionally Abused: Not on file  . Physically Abused: Not on file  . Sexually Abused: Not on file    Family History:  Family History  Problem Relation Age of Onset  . Heart disease Mother   . Stroke Mother   . Colon cancer Mother 26  . Heart disease Father     Medications:   Current Outpatient Medications  on File Prior to Visit  Medication Sig Dispense Refill  . anastrozole (ARIMIDEX) 1 MG tablet Take 1 tablet (1 mg total) by mouth daily. (Patient taking differently: Take 1 mg by mouth at bedtime. ) 90 tablet 4  . Ascorbic Acid (VITAMIN C GUMMIE PO) Take 2,000 mg by mouth daily.    Marland Kitchen aspirin 81 MG tablet Take 81 mg by mouth daily.    Marland Kitchen atenolol (TENORMIN) 100 MG tablet Take 50 mg by mouth at bedtime.   5  . Cholecalciferol (VITAMIN D3) 2000 UNITS TABS Take 1 tablet by mouth daily.    . metFORMIN (GLUCOPHAGE) 500 MG tablet Take 1 tablet (500 mg total) by mouth  daily with breakfast. 90 tablet 3  . nitroGLYCERIN (NITROSTAT) 0.4 MG SL tablet Place 0.4 mg under the tongue every 5 (five) minutes as needed for chest pain.     . rosuvastatin (CRESTOR) 40 MG tablet Take 1 tablet (40 mg total) by mouth daily. 90 tablet 0  . vitamin B-12 (CYANOCOBALAMIN) 500 MCG tablet Take 1 tablet (500 mcg total) by mouth daily.     No current facility-administered medications on file prior to visit.    Allergies:   Allergies  Allergen Reactions  . Capsule [Gelatin] Hives    Any medication that is in gel capsule form  . Capsaicin Other (See Comments)    Unknown rxn     Physical Exam  Vitals:   12/25/19 0801  BP: 138/84  Pulse: 71  Temp: (!) 96.2 F (35.7 C)  TempSrc: Oral  Weight: 197 lb (89.4 kg)  Height: 5' 8.5" (1.74 m)   Body mass index is 29.52 kg/m. No exam data present  Depression screen [facility] 2/9 12/25/2019  Decreased Interest 0  Down, Depressed, Hopeless 0  PHQ - 2 Score 0     General: well developed, well nourished,  pleasant middle-age African-American female, seated, in no evident distress Head: head normocephalic and atraumatic.   Neck: supple with no carotid or supraclavicular bruits Cardiovascular: regular rate and rhythm, no murmurs Musculoskeletal: no deformity Skin:  no rash/petichiae Vascular:  Normal pulses all extremities   Neurologic Exam Mental Status: Awake and fully  alert.   Normal speech and language.  Oriented to place and time. Recent and remote memory intact. Attention span, concentration and fund of knowledge appropriate. Mood and affect appropriate.  Cranial Nerves: Fundoscopic exam reveals sharp disc margins. Pupils equal, briskly reactive to light. Extraocular movements full without nystagmus. Visual fields full to confrontation. Hearing intact. Facial sensation intact. Face, tongue, palate moves normally and symmetrically.  Motor: Normal bulk and tone. Normal strength in all tested extremity muscles. Sensory.: intact to touch , pinprick , position and vibratory sensation.  Coordination: Rapid alternating movements normal in all extremities. Finger-to-nose showed mild right hand dysmetria and heel-to-shin performed accurately bilaterally. Gait and Station: Arises from chair without difficulty. Stance is normal. Gait demonstrates normal stride length and balance with slight difficulty with tandem walk and walking on toes and on heels Reflexes: 1+ and symmetric. Toes downgoing.     NIHSS  1 Modified Rankin  1 .   Diagnostic Data (Labs, Imaging, Testing)  CT HEAD WO CONTRAST CT ANGIO HEAD W OR WO CONTRAST CT ANGIO NECK W OR WO CONTRAST 11/13/2019 IMPRESSION: Evolving infarction of the left basal ganglia and adjacent white matter. No acute intracranial hemorrhage. No large vessel occlusion. No hemodynamically significant stenosis in the neck. Multifocal intracranial atherosclerosis. 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 Postcontrast MR imaging through the orbits is recommended.  MR BRAIN WO CONTRAST 11/12/2019 IMPRESSION: Acute infarct in the posterior external capsule on the left. Mild chronic ischemia. The patient refused to complete the study.  ECHOCARDIOGRAM 11/13/2019 IMPRESSIONS  1. Left ventricular ejection fraction, by visual estimation, is 60 to 65%. The left ventricle has normal function. There is no  left ventricular hypertrophy.  2. Elevated left atrial pressure.  3. Left ventricular diastolic parameters are consistent with Grade II diastolic dysfunction (pseudonormalization).  4. The left ventricle has no regional wall motion abnormalities.  5. Global right ventricle has normal systolic function.The right ventricular size is normal. No increase in right ventricular wall thickness.  6. Left  atrial size was normal.  7. Right atrial size was normal.  8. The mitral valve is normal in structure. Mild mitral valve regurgitation. No evidence of mitral stenosis.  9. The tricuspid valve is normal in structure. Tricuspid valve regurgitation is mild. 10. The aortic valve is normal in structure. Aortic valve regurgitation is not visualized. No evidence of aortic valve sclerosis or stenosis. 11. The pulmonic valve was normal in structure. Pulmonic valve regurgitation is not visualized. 12. Moderately elevated pulmonary artery systolic pressure. 13. The inferior vena cava is normal in size with greater than 50% respiratory variability, suggesting right atrial pressure of 3 mmHg.      ASSESSMENT: Johnita T Stakes is a 64 y.o. year old female presented with right-sided weakness and right hand numbness on 11/12/2019 with stroke work-up revealing left AchA territory small lacunar infarct likely secondary to small vessel disease source. Vascular risk factors include CAD with NSTEMI s/p stenting, HTN, HLD, uncontrolled DM, intracranial stenosis, obesity and EtOH use.  She has greatly recovered from recent stroke with residual deficits of mild right hand dysmetria, subjective thumb and pointer finger numbness/tingling and mild imbalance    PLAN:  1. Left AchA territory infarct: Continue aspirin 81 mg daily  and Crestor 40 mg daily for secondary stroke prevention. Maintain strict control of hypertension with blood pressure goal below 130/90, diabetes with hemoglobin A1c goal below 6.5% and cholesterol with LDL  cholesterol (bad cholesterol) goal below 70 mg/dL.  I also advised the patient to eat a healthy diet with plenty of whole grains, cereals, fruits and vegetables, exercise regularly with at least 30 minutes of continuous activity daily and maintain ideal body weight. 2. HTN: Advised to continue current treatment regimen.  Today's BP stable.  Advised to continue to monitor at home along with continued follow-up with PCP for management 3. HLD: Advised to continue current treatment regimen along with continued follow-up with PCP for future prescribing and monitoring of lipid panel.  Recent lipid panel showed excellent improvement with LDL 59 (prior 202) 4. DMII: Advised to continue to monitor glucose levels at home along with continued follow-up with PCP for management and monitoring 5. CAD s/p stent: Continue to follow with cardiology as scheduled 6. Imbalance, poststroke: Advised to continue to work with PT as scheduled for likely ongoing improvement.    Follow up in 3 months or call earlier if needed   Greater than 50% of time during this 45 minute visit was spent on counseling, explanation of diagnosis of left AchA territory infarct, reviewing risk factor management of HTN, HLD, DM, CAD status post stent, planning of further management along with potential future management, and discussion with patient answering all questions to satisfaction    Frann Rider, AGNP-BC  [facility] [address] [place], [place] [address]  Phone [phone] Fax [phone] Note: This document was prepared with digital dictation and possible smart phrase technology. Any transcriptional errors that result from this process are unintentional.

## 2019-12-26 ENCOUNTER — Ambulatory Visit: Payer: BC Managed Care – PPO | Admitting: Physical Therapy

## 2019-12-26 ENCOUNTER — Other Ambulatory Visit: Payer: Self-pay

## 2019-12-26 ENCOUNTER — Encounter: Payer: Self-pay | Admitting: Physical Therapy

## 2019-12-26 DIAGNOSIS — M6281 Muscle weakness (generalized): Secondary | ICD-10-CM

## 2019-12-26 DIAGNOSIS — Z0289 Encounter for other administrative examinations: Secondary | ICD-10-CM

## 2019-12-26 DIAGNOSIS — R2689 Other abnormalities of gait and mobility: Secondary | ICD-10-CM

## 2019-12-26 NOTE — Therapy (Signed)
[facility] [address] [place], [place], [address] Phone: [phone]   Fax:  [phone]  Physical Therapy Treatment  Patient Details  Name: Audrey Fischer MRN: [id number] Date of Birth: [birth year] Referring Provider (PT): Randall Hiss [place] but was hopsitalist. Will follow up with Frann Rider   Encounter Date: 12/26/2019  PT End of Session - 12/26/19 1151    Visit Number  3    Number of Visits  6    Date for PT Re-Evaluation  01/16/20    Authorization Type  BCBS-2021    PT Start Time  0732    PT Stop Time  0804   pt arrived late   PT Time Calculation (min)  32 min    Activity Tolerance  Patient tolerated treatment well    Behavior During Therapy  [facility] for tasks assessed/performed       Past Medical History:  Diagnosis Date  . Breast cancer ([place]) 07/09/15   left breast,  lower-inner quadrant   . Coronary artery disease    cardiologist-  dr Donnetta Hutching [facility] regional physicians, [facility] cardiology in [facility])---  hx PCI w/ stents to OM and LAD 02-08-2008  . Hyperlipidemia   . Hypertension   . PMB (postmenopausal bleeding)   . S/P coronary artery stent placement     Past Surgical History:  Procedure Laterality Date  . CESAREAN SECTION  10-09-1996  . CORONARY ANGIOPLASTY WITH STENT PLACEMENT  02-08-2008   dr Donnetta Hutching   PCI and stent to OM and LAD  . DILATATION & CURRETTAGE/HYSTEROSCOPY WITH RESECTOCOPE N/A 08/15/2015   Procedure: DILATATION & CURETTAGE/HYSTEROSCOPY ;  Surgeon: Eldred Manges, MD;  Location: [place];  Service: Gynecology;  Laterality: N/A;  . DILATION AND CURETTAGE OF UTERUS  1994   blighted ovum    There were no vitals filed for this visit.  Subjective Assessment - 12/26/19 0735    Subjective  Exercises going well but still having a hard time with tandem stance and gait; late this morning due to getting stuck in her garage.  Saw Neurology yesterday, no changes made.    Pertinent  History  breast cancer s/p radiation and surgery in remission 4 years now, CAD s/p PCI, HTN, hyperlipidemia, new DM diagnosis    Patient Stated Goals  Pt would like her fingers to be less tingling and be able to do the running man again.    Currently in Pain?  No/denies                       [facility] Adult PT Treatment/Exercise - 12/26/19 0745      Exercises   Exercises  Knee/Hip      Knee/Hip Exercises: Aerobic   Elliptical  level 1.5 x 3 minutes forwards + 2 minutes backwards for aerobic conditioning, hamstring strengthening and gait sequencing      Knee/Hip Exercises: Standing   Hip Flexion  Stengthening;Both;3 sets;Knee bent    Hip Flexion Limitations  walking marches with green band around LE for resistance, 3 laps down // bars    Hip Extension  Stengthening;Both;4 sets;Knee bent    Extension Limitations  walking backwards with resistance of green theraband around LE, 4 laps in // bars          Balance Exercises - 12/26/19 0756      Balance Exercises: Standing   Tandem Stance  Eyes open;Foam/compliant surface;Upper extremity support 1;Other reps (comment)   10 reps on blue  balance beam, 10 head turns/nods each   Tandem Gait  Forward;Intermittent upper extremity support;Foam/compliant surface;4 reps          PT Short Term Goals - 12/17/19 1435      PT SHORT TERM GOAL #1   Title  STG=LTGs        PT Long Term Goals - 12/17/19 1436      PT LONG TERM GOAL #1   Title  Pt will be independent with HEP for high level balance, strengthening and walking program to continue gains on own.    Time  4    Period  Weeks    Status  New    Target Date  01/16/20      PT LONG TERM GOAL #2   Title  Pt will increase comfortable gait speed to >1.53m/s for normal community ambulation speed.    Baseline  0.63m/s    Time  4    Period  Weeks    Status  New    Target Date  01/16/20      PT LONG TERM GOAL #3   Title  Pt will be able to maintain SLS >15 sec bilateral  LE for improved balance.    Baseline  6 sec on left, 12 sec on right but very unsteady    Time  4    Period  Weeks    Status  New    Target Date  01/16/20      PT LONG TERM GOAL #4   Title  Pt will ambulate >500' on varied surfaces independently with no significant deficits in gait quality for improved ambulation.    Time  4    Period  Weeks    Status  New    Target Date  01/16/20            Plan - 12/26/19 0805    Clinical Impression Statement  Incorporated use of elliptical for aerobic conditioning, hamstring strengthening and reciprocal LE movement to simulate gait.  Pt only able to perform 3 minutes forwards, 2 minutes backwards - pt more motivated to begin walking again for aerobic conditioning.  Continued to focus on dynamic standing balance and LE strengthening with addition of green theraband for backwards walking and forward marching.  Added compliant surface to tandem gait and stance for increased activation of proximal hip stabilization muscles. Pt reporting still having a hard time with bending to the ground to retrieve objects without falling forwards; will begin to incorporate into balance work.    Personal Factors and Comorbidities  Comorbidity 3+    Comorbidities  breast cancer s/p radiation and surgery in remission 4 years now, CAD s/p PCI, HTN, hyperlipidemia, new DM diagnosis    Examination-Activity Limitations  Locomotion Level;Bend    Examination-Participation Restrictions  Community Activity    Stability/Clinical Decision Making  Stable/Uncomplicated    Rehab Potential  Good    PT Frequency  2x / week   followed by 1x/week for 2 weeks   PT Duration  2 weeks    PT Treatment/Interventions  ADLs/Self Care Home Management;Gait training;Stair training;Functional mobility training;Therapeutic activities;Therapeutic exercise;Patient/family education;Neuromuscular re-education;Balance training;Manual techniques    PT Next Visit Plan  balance training when bending down  to floor to pick up objects without falling forwards; elliptical for endurance, Gait training on varied surfaces without AD, high level balance including SLS, right LE strengthening with focus on hamstring.    Consulted and Agree with Plan of Care  Patient  Patient will benefit from skilled therapeutic intervention in order to improve the following deficits and impairments:  Abnormal gait, Decreased balance  Visit Diagnosis: Other abnormalities of gait and mobility  Muscle weakness (generalized)     Problem List Patient Active Problem List   Diagnosis Date Noted  . Newly diagnosed diabetes ([place]) 11/19/2019  . Hx of completed stroke 11/12/2019  . Dizziness 01/03/2019  . Coronary artery disease due to lipid rich plaque 01/03/2019  . Sialoadenitis 04/04/2018  . Cough 02/21/2018  . Hematuria 08/11/2017  . Malignant neoplasm of overlapping sites of left breast in female, estrogen receptor positive ([place]) 03/28/2017  . Hearing loss due to cerumen impaction, right 12/23/2016  . Routine general medical examination at a health care facility 10/02/2016  . Unexplained weight gain 06/17/2016  . Hyperlipidemia associated with type 2 diabetes mellitus ([place]) 08/27/2015  . PMB (postmenopausal bleeding) 08/14/2015  . Fibroids 08/14/2015  . Cervical stenosis (uterine cervix) 08/14/2015  . Thickened endometrium 08/14/2015  . HTN (hypertension) 06/02/2013    Rico Junker, PT, DPT 12/26/19    11:57 AM    [facility] [address] [place], [place], [address] Phone: [phone]   Fax:  [phone]  Name: Audrey Fischer MRN: [id number] Date of Birth: August 10, 1956

## 2019-12-26 NOTE — Assessment & Plan Note (Signed)
BP at goal on atenolol 100 mg daily and will keep regimen same. She was asking about change to carvedilol but I explained that this medication has to be taken BID and she would rather stay with atenolol for now. We talked about how good BP control is more important than which BP meds she is taking for now. We can make adjustments later if needed.

## 2019-12-26 NOTE — Assessment & Plan Note (Signed)
Checking lipid panel on crestor 40 mg daily for several months now.

## 2019-12-26 NOTE — Assessment & Plan Note (Signed)
Overall continuing to improve and we talked about how she could expect to continue to improve for next year or so.

## 2019-12-26 NOTE — Assessment & Plan Note (Signed)
Checking fructosamine to see if we need to increase metformin dosing. She is tolerating well. Taking statin.

## 2019-12-27 ENCOUNTER — Telehealth (INDEPENDENT_AMBULATORY_CARE_PROVIDER_SITE_OTHER): Payer: BC Managed Care – PPO | Admitting: Internal Medicine

## 2019-12-27 ENCOUNTER — Encounter: Payer: Self-pay | Admitting: Internal Medicine

## 2019-12-27 VITALS — BP 132/88 | Temp 97.1°F | Ht 68.0 in | Wt 197.0 lb

## 2019-12-27 DIAGNOSIS — Z170 Estrogen receptor positive status [ER+]: Secondary | ICD-10-CM

## 2019-12-27 DIAGNOSIS — Z9861 Coronary angioplasty status: Secondary | ICD-10-CM

## 2019-12-27 DIAGNOSIS — I10 Essential (primary) hypertension: Secondary | ICD-10-CM | POA: Diagnosis not present

## 2019-12-27 DIAGNOSIS — I214 Non-ST elevation (NSTEMI) myocardial infarction: Secondary | ICD-10-CM

## 2019-12-27 DIAGNOSIS — Z8673 Personal history of transient ischemic attack (TIA), and cerebral infarction without residual deficits: Secondary | ICD-10-CM

## 2019-12-27 DIAGNOSIS — I2510 Atherosclerotic heart disease of native coronary artery without angina pectoris: Secondary | ICD-10-CM | POA: Diagnosis not present

## 2019-12-27 DIAGNOSIS — C50812 Malignant neoplasm of overlapping sites of left female breast: Secondary | ICD-10-CM

## 2019-12-27 DIAGNOSIS — E1169 Type 2 diabetes mellitus with other specified complication: Secondary | ICD-10-CM

## 2019-12-27 DIAGNOSIS — E785 Hyperlipidemia, unspecified: Secondary | ICD-10-CM

## 2019-12-27 DIAGNOSIS — E119 Type 2 diabetes mellitus without complications: Secondary | ICD-10-CM

## 2019-12-27 LAB — FRUCTOSAMINE: Fructosamine: 348 umol/L — ABNORMAL HIGH (ref 205–285)

## 2019-12-27 NOTE — Patient Instructions (Addendum)
Medication Instructions:  Continue same medications *If you need a refill on your cardiac medications before your next appointment, please call your pharmacy*  Lab Work: None ordered    Testing/Procedures: None ordered  Follow-Up: At [facility], you and your health needs are our priority.  As part of our continuing mission to provide you with exceptional heart care, we have created designated Provider Care Teams.  These Care Teams include your primary Cardiologist (physician) and Advanced Practice Providers (APPs -  Physician Assistants and Nurse Practitioners) who all work together to provide you with the care you need, when you need it.  Your next appointment:  Wed 03/26/20 at 4:00 pm.   The format for your next appointment:  Office   Provider:  Dr.Acharya

## 2019-12-28 ENCOUNTER — Ambulatory Visit: Payer: BC Managed Care – PPO

## 2019-12-28 ENCOUNTER — Other Ambulatory Visit: Payer: Self-pay

## 2019-12-28 DIAGNOSIS — R2689 Other abnormalities of gait and mobility: Secondary | ICD-10-CM | POA: Diagnosis not present

## 2019-12-28 DIAGNOSIS — M6281 Muscle weakness (generalized): Secondary | ICD-10-CM

## 2019-12-28 NOTE — Therapy (Signed)
[facility] [address] 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 Will follow up with Frann Rider   Encounter Date: 12/28/2019  PT End of Session - 12/28/19 0725    Visit Number  4    Number of Visits  6    Date for PT Re-Evaluation  01/16/20    Authorization Type  BCBS-2021    PT Start Time  0722   Pt running behind   PT Stop Time  0802    PT Time Calculation (min)  40 min    Activity Tolerance  Patient tolerated treatment well    Behavior During Therapy  [facility] for tasks assessed/performed       Past Medical History:  Diagnosis Date  . Breast cancer ([place]) 07/09/15   left breast,  lower-inner quadrant   . Coronary artery disease    cardiologist-  dr Donnetta Hutching [facility] regional physicians, [facility] cardiology [facility])---  hx PCI w/ stents to OM and LAD 02-08-2008  . Hyperlipidemia   . Hypertension   . PMB (postmenopausal bleeding)   . S/P coronary artery stent placement     Past Surgical History:  Procedure Laterality Date  . CESAREAN SECTION  10-09-1996  . CORONARY ANGIOPLASTY WITH STENT PLACEMENT  02-08-2008   dr Donnetta Hutching   PCI and stent to OM and LAD  . DILATATION & CURRETTAGE/HYSTEROSCOPY WITH RESECTOCOPE N/A 08/15/2015   Procedure: DILATATION & CURETTAGE/HYSTEROSCOPY ;  Surgeon: Eldred Manges, MD;  Location: [place];  Service: Gynecology;  Laterality: N/A;  . DILATION AND CURETTAGE OF UTERUS  1994   blighted ovum    There were no vitals filed for this visit.  Subjective Assessment - 12/28/19 0724    Subjective  Pt reports that she couldn't believe how out of shape she was after last visit on elliptical. Has not taken BP med this morning yet.    Pertinent History  breast cancer s/p radiation and  surgery in remission 4 years now, CAD s/p PCI, HTN, hyperlipidemia, new DM diagnosis    Patient Stated Goals  Pt would like her fingers to be less tingling and be able to do the running man again.    Currently in Pain?  No/denies                       [facility] Adult PT Treatment/Exercise - 12/28/19 0725      Ambulation/Gait   Ambulation/Gait  Yes    Ambulation/Gait Assistance  7: Independent    Ambulation Distance (Feet)  --   around clinic with activities   Assistive device  None    Gait Pattern  Step-through pattern      Neuro Re-ed    Neuro Re-ed Details   Squatting down to tap 4 cones with hand and then stepping over cone.  Bending down and tapping cone x 2 then stepping over x 8. Picking up  4 birdies off floor. Pt denied any dizziness.  Dynamic gait activities in hallway: marching gait 40' x 2, large steps 40' x 2, heel walking 20' x 1 and then toe walking 20' x 1, side stepping 40' x 2, backwards gait 40' x 2.  SLS with 1 foot on soccerball x 30 sec each foot then with moving ball front/back  and side to side x 10 each foot with occasional finger tip support. SBA for safety.      Knee/Hip Exercises: Aerobic   Elliptical  Level 1.5 x 4 min. BP prior 130/82 and after 170/88 with 5/10 RPE.  After sititng a couple minutes  BP 148/82             PT Education - 12/28/19 1046    Education Details  Pt to continue with current HEP. Discussed walking program with gradually increasing time walked versus concern for distance.    Person(s) Educated  Patient    Methods  Explanation    Comprehension  Verbalized understanding       PT Short Term Goals - 12/17/19 1435      PT SHORT TERM GOAL #1   Title  STG=LTGs        PT Long Term Goals - 12/17/19 1436      PT LONG TERM GOAL #1   Title  Pt will be independent with HEP for high level balance, strengthening and walking program to continue gains on own.    Time  4    Period  Weeks    Status  New    Target Date   01/16/20      PT LONG TERM GOAL #2   Title  Pt will increase comfortable gait speed to >1.28m/s for normal community ambulation speed.    Baseline  0.17m/s    Time  4    Period  Weeks    Status  New    Target Date  01/16/20      PT LONG TERM GOAL #3   Title  Pt will be able to maintain SLS >15 sec bilateral LE for improved balance.    Baseline  6 sec on left, 12 sec on right but very unsteady    Time  4    Period  Weeks    Status  New    Target Date  01/16/20      PT LONG TERM GOAL #4   Title  Pt will ambulate >500' on varied surfaces independently with no significant deficits in gait quality for improved ambulation.    Time  4    Period  Weeks    Status  New    Target Date  01/16/20            Plan - 12/28/19 1047    Clinical Impression Statement  Pt did fatigue quickly on elliptical. BP elevated after but did return back down to more normal range after sitting a couple minutes. Had not taken BP med yet this morning. PT advised to take BP meds prior to aerobic activity. PT had patient perform some bending activities and patient denied any feeling of falling forward and performed well. Reports that she had not tried since right after stroke and seems to have improved.    Personal Factors and Comorbidities  Comorbidity 3+    Comorbidities  breast cancer s/p radiation and surgery in remission 4 years now, CAD s/p PCI, HTN, hyperlipidemia, new DM diagnosis    Examination-Activity Limitations  Locomotion Level;Bend    Examination-Participation Restrictions  Community Activity    Stability/Clinical Decision Making  Stable/Uncomplicated    Rehab Potential  Good    PT Frequency  2x / week   followed by 1x/week for 2 weeks   PT Duration  2 weeks    PT Treatment/Interventions  ADLs/Self Care Home Management;Gait training;Stair training;Functional mobility training;Therapeutic activities;Therapeutic exercise;Patient/family education;Neuromuscular re-education;Balance  training;Manual  techniques    PT Next Visit Plan  How has walking program been going? elliptical for endurance, Gait training on varied surfaces without AD, high level balance including SLS, right LE strengthening with focus on hamstring.    Consulted and Agree with Plan of Care  Patient       Patient will benefit from skilled therapeutic intervention in order to improve the following deficits and impairments:  Abnormal gait, Decreased balance  Visit Diagnosis: Other abnormalities of gait and mobility  Muscle weakness (generalized)     Problem List Patient Active Problem List   Diagnosis Date Noted  . Newly diagnosed diabetes ([place]) 11/19/2019  . Hx of completed stroke 11/12/2019  . Dizziness 01/03/2019  . Coronary artery disease due to lipid rich plaque 01/03/2019  . Sialoadenitis 04/04/2018  . Cough 02/21/2018  . Hematuria 08/11/2017  . Malignant neoplasm of overlapping sites of left breast in female, estrogen receptor positive ([place]) 03/28/2017  . Hearing loss due to cerumen impaction, right 12/23/2016  . Routine general medical examination at a health care facility 10/02/2016  . Unexplained weight gain 06/17/2016  . Hyperlipidemia associated with type 2 diabetes mellitus ([place]) 08/27/2015  . PMB (postmenopausal bleeding) 08/14/2015  . Fibroids 08/14/2015  . Cervical stenosis (uterine cervix) 08/14/2015  . Thickened endometrium 08/14/2015  . HTN (hypertension) 06/02/2013    Electa Sniff, PT, DPT, NCS 12/28/2019, 10:51 AM  [facility] [address] [place], [place], [address] Phone: [phone]   Fax:  [phone]  Name: Audrey Fischer MRN: [id number] Date of Birth: [birth year]

## 2019-12-28 NOTE — Progress Notes (Signed)
Virtual Visit via Telephone Note   This visit type was conducted due to national recommendations for restrictions regarding the COVID-19 Pandemic (e.g. social distancing) in an effort to limit this patient's exposure and mitigate transmission in our community.  Due to her co-morbid illnesses, this patient is at least at moderate risk for complications without adequate follow up.  This format is felt to be most appropriate for this patient at this time.  The patient did not have access to video technology/had technical difficulties with video requiring transitioning to audio format only (telephone).  All issues noted in this document were discussed and addressed.  No physical exam could be performed with this format.  Please refer to the patient's chart for her  consent to telehealth for [facility].   Date:  12/27/2019   ID:  Audrey Fischer, DOB 19-Jul-1956, MRN [id number]  Patient Location: Home Provider Location: Office  PCP:  Hoyt Koch, MD  Cardiologist:  Elouise Munroe, MD  Electrophysiologist:  None   Evaluation Performed:  Follow-Up Visit  Chief Complaint:  F/u HTN, CAD s/p NSTEMI, recent stroke  History of Present Illness:    Audrey Fischer is a 64 y.o. female with NSTEMI in March 2009 with LAD and OM stents, hypertension, hyperlipidemia, and history of breast cancer on anastrozole.  She also has a recent history of stroke in Dec 2020.   In December 2020 she suffered a stroke with symptoms starting on December 13 presenting with right hand numbness and tingling as well as gait disturbance.  MRI showed acute infarct in the posterior external capsule on the left.  She was recommended to take Plavix for 3 weeks in addition to indefinite aspirin therapy 81 mg daily.  Crestor was increased to 40 mg daily.  She was newly diagnosed with diabetes with an A1c of 9.7.  She is making remarkable recovery from a functional standpoint and is nearly to her baseline.  She is  dedicated in her therapy and is determined to regain function without deficit. Pincer grasp is improving and she is able to write now.   Blood pressure appears better today, although she is not keen to transition to carvedilol.  We discussed aggressive risk factor modification, including control of diabetes.  We discussed SGLT2 inhibitor therapy.  She prefers to research her medications prior to taking them given significant concerns about side effects.  I have discussed the supportive literature for patient such as herself with known cardiovascular and cerebrovascular disease in the setting of diabetes.  She would be a great candidate for an SGLT2 inhibitor.  She would like to research this, and she is hoping to establish care with an endocrinologist which I encouraged to continue this conversation.  She denies chest discomfort, shortness of breath, palpitations, PND, orthopnea, significant leg swelling, denies syncope or presyncope.  The patient does not have symptoms concerning for COVID-19 infection (fever, chills, cough, or new shortness of breath).    Past Medical History:  Diagnosis Date  . Breast cancer ([place]) 07/09/15   left breast,  lower-inner quadrant   . Coronary artery disease    cardiologist-  dr Donnetta Hutching [facility] regional physicians, [facility] cardiology in [facility])---  hx PCI w/ stents to OM and LAD 02-08-2008  . Hyperlipidemia   . Hypertension   . PMB (postmenopausal bleeding)   . S/P coronary artery stent placement    Past Surgical History:  Procedure Laterality Date  . CESAREAN SECTION  10-09-1996  . CORONARY ANGIOPLASTY  WITH STENT PLACEMENT  02-08-2008   dr Donnetta Hutching   PCI and stent to OM and LAD  . DILATATION & CURRETTAGE/HYSTEROSCOPY WITH RESECTOCOPE N/A 08/15/2015   Procedure: DILATATION & CURETTAGE/HYSTEROSCOPY ;  Surgeon: Eldred Manges, MD;  Location: [place];  Service: Gynecology;  Laterality: N/A;  . DILATION AND CURETTAGE OF UTERUS  1994    blighted ovum     Current Meds  Medication Sig  . anastrozole (ARIMIDEX) 1 MG tablet Take 1 tablet (1 mg total) by mouth daily. (Patient taking differently: Take 1 mg by mouth at bedtime. )  . Ascorbic Acid (VITAMIN C GUMMIE PO) Take 2,000 mg by mouth daily.  Marland Kitchen aspirin 81 MG tablet Take 81 mg by mouth daily.  Marland Kitchen atenolol (TENORMIN) 100 MG tablet Take 50 mg by mouth at bedtime.   . Cholecalciferol (VITAMIN D3) 2000 UNITS TABS Take 1 tablet by mouth daily.  . metFORMIN (GLUCOPHAGE) 500 MG tablet Take 1 tablet (500 mg total) by mouth daily with breakfast.  . nitroGLYCERIN (NITROSTAT) 0.4 MG SL tablet Place 0.4 mg under the tongue every 5 (five) minutes as needed for chest pain.   . rosuvastatin (CRESTOR) 40 MG tablet Take 1 tablet (40 mg total) by mouth daily.  . vitamin B-12 (CYANOCOBALAMIN) 500 MCG tablet Take 1 tablet (500 mcg total) by mouth daily.     Allergies:   Capsule [gelatin] and Capsaicin   Social History   Tobacco Use  . Smoking status: Never Smoker  . Smokeless tobacco: Never Used  Substance Use Topics  . Alcohol use: Yes    Alcohol/week: 0.0 standard drinks    Comment: occasionally  . Drug use: No     Family Hx: The patient's family history includes Colon cancer (age of onset: 37) in her mother; Heart disease in her father and mother; Stroke in her mother.  ROS:   Please see the history of present illness.     All other systems reviewed and are negative.   Prior CV studies:   The following studies were reviewed today:    Labs/Other Tests and Data Reviewed:    EKG:  No ECG reviewed.  Recent Labs: 01/02/2019: TSH 1.55 12/24/2019: ALT 24; BUN 25; Creatinine, Ser 1.87; Hemoglobin 13.4; Platelets 262.0; Potassium 3.8; Sodium 136   Recent Lipid Panel Lab Results  Component Value Date/Time   CHOL 127 12/24/2019 04:03 PM   TRIG 150.0 (H) 12/24/2019 04:03 PM   HDL 37.80 (L) 12/24/2019 04:03 PM   CHOLHDL 3 12/24/2019 04:03 PM   LDLCALC 59 12/24/2019 04:03 PM      Wt Readings from Last 3 Encounters:  12/27/19 197 lb (89.4 kg)  12/25/19 197 lb (89.4 kg)  12/24/19 197 lb (89.4 kg)     Objective:    Vital Signs:  BP 132/88   Temp (!) 97.1 F (36.2 C)   Ht 5\' 8"  (1.727 m)   Wt 197 lb (89.4 kg)   BMI 29.95 kg/m    VITAL SIGNS:  reviewed GEN:  no acute distress NEURO:  alert and oriented x 3 PSYCH:  normal affect  ASSESSMENT & PLAN:    1. Coronary artery disease involving native coronary artery of native heart without angina pectoris   2. History of percutaneous coronary intervention   3. Essential hypertension   4. Non-ST elevation (NSTEMI) myocardial infarction ([place])   5. Newly diagnosed diabetes ([place])   6. Hyperlipidemia associated with type 2 diabetes mellitus ([place])   7. Hx of completed  stroke   8. Malignant neoplasm of overlapping sites of left breast in female, estrogen receptor positive ([place])    Coronary artery disease status post PCI for NSTEMI-stable and chest pain-free.  She continues on aspirin 81 mg daily and Crestor 40 mg daily.  She would like to remain on atenolol 50 mg daily.  Given no significant blood pressure elevation today, it is reasonable to continue this until her next discussion.  Ideally we can transition her to carvedilol for best blood pressure control as time goes on.  She remains concerned about medication transitions because a friend of hers recently had a blood pressure medication transition and he was hospitalized with an uncontrollable tick.  Recent CVA-on aspirin 81 mg daily and high intensity statin therapy.  Diabetes mellitus, A1c 9.7-she has been started on Metformin.  She would be a good candidate for SGLT2 inhibitor discussion.  She would like to think about this, and discuss it with endocrinology when she establishes care.  Hyperlipidemia-lipids have greatly improved since last check, LDL is now 59.  Continue to monitor, no change in therapy.  Hypertension-stable today, patient requests no change  in therapy.  COVID-19 Education: The signs and symptoms of COVID-19 were discussed with the patient and how to seek care for testing (follow up with PCP or arrange E-visit).  The importance of social distancing was discussed today.  Time:   Today, I have spent 20 minutes with the patient with telehealth technology discussing the above problems.  10 additional minutes spent on medical record review.   Medication Adjustments/Labs and Tests Ordered: Current medicines are reviewed at length with the patient today.  Concerns regarding medicines are outlined above.   Tests Ordered: No orders of the defined types were placed in this encounter.   Medication Changes: No orders of the defined types were placed in this encounter.   Follow Up:  3 mo  Signed, Elouise Munroe, MD  [place]

## 2020-01-02 ENCOUNTER — Encounter: Payer: Self-pay | Admitting: Physical Therapy

## 2020-01-02 ENCOUNTER — Ambulatory Visit: Payer: BC Managed Care – PPO | Attending: Internal Medicine | Admitting: Physical Therapy

## 2020-01-02 ENCOUNTER — Other Ambulatory Visit: Payer: Self-pay

## 2020-01-02 VITALS — BP 120/80

## 2020-01-02 DIAGNOSIS — M6281 Muscle weakness (generalized): Secondary | ICD-10-CM

## 2020-01-02 DIAGNOSIS — R2689 Other abnormalities of gait and mobility: Secondary | ICD-10-CM | POA: Diagnosis not present

## 2020-01-02 NOTE — Therapy (Signed)
[facility] [address] 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 Will follow up with Frann Rider   Encounter Date: 01/02/2020  PT End of Session - 01/02/20 0811    Visit Number  5    Number of Visits  6    Date for PT Re-Evaluation  01/16/20    Authorization Type  BCBS-2021    PT Start Time  0734   arrived late   PT Stop Time  0801    PT Time Calculation (min)  27 min    Activity Tolerance  Patient tolerated treatment well    Behavior During Therapy  [facility] for tasks assessed/performed       Past Medical History:  Diagnosis Date  . Breast cancer ([place]) 07/09/15   left breast,  lower-inner quadrant   . Coronary artery disease    cardiologist-  dr Donnetta Hutching [facility] regional physicians, [facility] cardiology in [facility])---  hx PCI w/ stents to OM and LAD 02-08-2008  . Hyperlipidemia   . Hypertension   . PMB (postmenopausal bleeding)   . S/P coronary artery stent placement     Past Surgical History:  Procedure Laterality Date  . CESAREAN SECTION  10-09-1996  . CORONARY ANGIOPLASTY WITH STENT PLACEMENT  02-08-2008   dr Donnetta Hutching   PCI and stent to OM and LAD  . DILATATION & CURRETTAGE/HYSTEROSCOPY WITH RESECTOCOPE N/A 08/15/2015   Procedure: DILATATION & CURETTAGE/HYSTEROSCOPY ;  Surgeon: Eldred Manges, MD;  Location: [place];  Service: Gynecology;  Laterality: N/A;  . [place]   blighted ovum    Vitals:   01/02/20 0752  BP: 120/80    Subjective Assessment - 01/02/20 0736    Subjective  Late this morning due to taking a different way to get here.  Had a fall this morning, rushing to bathroom and tripped over yoga mat.  No injuries but feels flustered.    Pertinent History   breast cancer s/p radiation and surgery in remission 4 years now, CAD s/p PCI, HTN, hyperlipidemia, new DM diagnosis    Patient Stated Goals  Pt would like her fingers to be less tingling and be able to do the running man again.                       [facility] Adult PT Treatment/Exercise - 01/02/20 0804      Exercises   Exercises  Other Exercises    Other Exercises   for balance and weight shifting as well as hamstring strengthening: reaching down to floor in various directions to pick up cones while on balance foam, placed cones back on ground in same pattern reaching down and to the right, middle, left x 10 reps.  Performed 3 sets x 10 reps standing hamstring dead lifts with 8lb in both UE, 2 on solid ground and once on foam.   Performed anterior step ups on BOSU with contralateral LE foot tap to tall target with one UE support x 20 reps.  Performed squats on BOSU 2 sets x 10 reps without holding weights and then with holding 8lb in each UE.  Without weight - Performed 6 reps each side R and L lateral lunge to BOSU with push off.  Cool off with  3 laps around gym.  Min A overall for balance exercises on compliant surface.  No LOB with reaching forwards.  Intermittent LOB on Bosu backwards or when performing with RLE.             PT Education - 01/02/20 0810    Education Details  Plan for final session next week    Person(s) Educated  Patient    Methods  Explanation    Comprehension  Verbalized understanding       PT Short Term Goals - 12/17/19 1435      PT SHORT TERM GOAL #1   Title  STG=LTGs        PT Long Term Goals - 12/17/19 1436      PT LONG TERM GOAL #1   Title  Pt will be independent with HEP for high level balance, strengthening and walking program to continue gains on own.    Time  4    Period  Weeks    Status  New    Target Date  01/16/20      PT LONG TERM GOAL #2   Title  Pt will increase comfortable gait speed to >1.59m/s for normal community  ambulation speed.    Baseline  0.47m/s    Time  4    Period  Weeks    Status  New    Target Date  01/16/20      PT LONG TERM GOAL #3   Title  Pt will be able to maintain SLS >15 sec bilateral LE for improved balance.    Baseline  6 sec on left, 12 sec on right but very unsteady    Time  4    Period  Weeks    Status  New    Target Date  01/16/20      PT LONG TERM GOAL #4   Title  Pt will ambulate >500' on varied surfaces independently with no significant deficits in gait quality for improved ambulation.    Time  4    Period  Weeks    Status  New    Target Date  01/16/20            Plan - 01/02/20 0811    Clinical Impression Statement  Pt demonstrated improved BP this morning but did experience a fall this morning; no injury.  Pt able to tolerate higher level balance training with LE muscle strengthening; experienced mild LOB with exercises and required intermittent min A.  Pt reports that imbalance when reaching down and forwards has improved when performing at home.  Pt is performing aerobic conditioning and balance training at home; anticipate pt will be ready for D/C next session.    Personal Factors and Comorbidities  Comorbidity 3+    Comorbidities  breast cancer s/p radiation and surgery in remission 4 years now, CAD s/p PCI, HTN, hyperlipidemia, new DM diagnosis    Examination-Activity Limitations  Locomotion Level;Bend    Examination-Participation Restrictions  Community Activity    Stability/Clinical Decision Making  Stable/Uncomplicated    Rehab Potential  Good    PT Frequency  2x / week   followed by 1x/week for 2 weeks   PT Duration  2 weeks    PT Treatment/Interventions  ADLs/Self Care Home Management;Gait training;Stair training;Functional mobility training;Therapeutic activities;Therapeutic exercise;Patient/family education;Neuromuscular re-education;Balance training;Manual techniques    PT Next Visit Plan  Check LTG, finalize HEP - D/C?    Consulted and Agree  with Plan of Care  Patient  Patient will benefit from skilled therapeutic intervention in order to improve the following deficits and impairments:  Abnormal gait, Decreased balance  Visit Diagnosis: Other abnormalities of gait and mobility  Muscle weakness (generalized)     Problem List Patient Active Problem List   Diagnosis Date Noted  . Newly diagnosed diabetes ([place]) 11/19/2019  . Hx of completed stroke 11/12/2019  . Dizziness 01/03/2019  . Coronary artery disease due to lipid rich plaque 01/03/2019  . Sialoadenitis 04/04/2018  . Cough 02/21/2018  . Hematuria 08/11/2017  . Malignant neoplasm of overlapping sites of left breast in female, estrogen receptor positive ([place]) 03/28/2017  . Hearing loss due to cerumen impaction, right 12/23/2016  . Routine general medical examination at a health care facility 10/02/2016  . Unexplained weight gain 06/17/2016  . Hyperlipidemia associated with type 2 diabetes mellitus ([place]) 08/27/2015  . PMB (postmenopausal bleeding) 08/14/2015  . Fibroids 08/14/2015  . Cervical stenosis (uterine cervix) 08/14/2015  . Thickened endometrium 08/14/2015  . HTN (hypertension) 06/02/2013    Rico Junker, PT, DPT 01/02/20    8:15 AM    [facility] [facility] [address] [place], [place], [address] Phone: [phone]   Fax:  [phone]  Name: Audrey Fischer MRN: [id number] Date of Birth: 04-27-1956

## 2020-01-10 ENCOUNTER — Ambulatory Visit: Payer: BC Managed Care – PPO

## 2020-01-18 ENCOUNTER — Other Ambulatory Visit: Payer: Self-pay

## 2020-01-18 ENCOUNTER — Telehealth: Payer: Self-pay

## 2020-01-18 ENCOUNTER — Ambulatory Visit: Payer: BC Managed Care – PPO

## 2020-01-18 DIAGNOSIS — R2689 Other abnormalities of gait and mobility: Secondary | ICD-10-CM

## 2020-01-18 DIAGNOSIS — M6281 Muscle weakness (generalized): Secondary | ICD-10-CM

## 2020-01-18 NOTE — Therapy (Signed)
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 Will follow up with [place]  Visits from Start of Care: 6  Current functional level related to goals / functional outcomes: See clinical impression for information.   Remaining deficits: No significant issues but is still progressing activity tolerance with gradually increasing gait time.   Education / Equipment: HEP  Plan: Patient agrees to discharge.  Patient goals were met. Patient is being discharged due to meeting the stated rehab goals.  ?????       Encounter Date: 01/18/2020  PT End of Session - 01/18/20 1038    Visit Number  6    Number of Visits  6    Date for PT Re-Evaluation  01/16/20    Authorization Type  [id number]    PT Start Time  1035   Pt arrived late and d/c visit   PT Stop Time  1056    PT Time Calculation (min)  21 min    Activity Tolerance  Patient tolerated treatment well    Behavior During Therapy  WFL for tasks assessed/performed       Past Medical History:  Diagnosis Date  . Breast cancer ([place]) 07/09/15   left breast,  lower-inner quadrant   . Coronary artery disease    cardiologist-  dr Donnetta Hutching [facility] regional physicians, [facility] cardiology in [facility])---  hx PCI w/ stents to OM and LAD 02-08-2008  . Hyperlipidemia   . Hypertension   . PMB (postmenopausal bleeding)   . S/P coronary artery stent placement     Past Surgical History:  Procedure Laterality Date  . CESAREAN SECTION  10-09-1996  . CORONARY ANGIOPLASTY WITH STENT PLACEMENT  02-08-2008   dr Donnetta Hutching   PCI and stent to OM and LAD  . DILATATION & CURRETTAGE/HYSTEROSCOPY WITH RESECTOCOPE N/A 08/15/2015    Procedure: DILATATION & CURETTAGE/HYSTEROSCOPY ;  Surgeon: Eldred Manges, MD;  Location: [facility];  Service: Gynecology;  Laterality: N/A;  . DILATION AND CURETTAGE OF UTERUS  1994   blighted ovum    There were no vitals filed for this visit.  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 Pt reports that she is doing well. Denies any concerns. Did walk outside 3 days last week.    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    Currently in Pain?  No/denies                       [facility] Adult PT Treatment/Exercise - 01/18/20 1043      Ambulation/Gait   Ambulation/Gait  Yes    Ambulation/Gait Assistance  7: Independent    Ambulation Distance (Feet)  600 Feet    Assistive device  None    Gait Pattern  Step-through pattern    Ambulation Surface  Level;Indoor    Gait velocity  7.6 sec=1.31 m/s    Stairs  Yes    Stairs Assistance  7: Independent  Stair Management Technique  No rails;Alternating pattern    Number of Stairs  4    Ramp  7: Independent    Curb  7: Independent      High Level Balance   High Level Balance Comments  SLS left=>15 sec and left=12 sec      Knee/Hip Exercises: Aerobic   Elliptical  level 2 x 3 min. BP= 180/100 after with no other symptoms. Pt has not taken BP  meds this morning. PT encouraged to take morning BP meds on time especially before exercise. Pt reports that she just was busy this morning. States is checking BP and was good last night.             PT Education - 01/18/20 1346    Education Details  Pt to continue with current HEP and walking program. Pt reports she is also trying to do some resistance training and PT went over proper breathing technique to prevent BP  from shooting up. Also stressed importance of taking BP meds on time.    Person(s) Educated  Patient    Methods  Explanation    Comprehension  Verbalized understanding       PT Short Term Goals - 12/17/19 1435      PT SHORT TERM GOAL #1   Title  STG=LTGs        PT Long Term Goals - 01/18/20 1347      PT LONG TERM GOAL #1   Title  Pt will be independent with HEP for high level balance, strengthening and walking program to continue gains on own.    Time  4    Period  Weeks    Status  Achieved      PT LONG TERM GOAL #2   Title  Pt will increase comfortable gait speed to >1.24ms for normal community ambulation speed.    Baseline  1.382m on 01/18/20    Time  4    Period  Weeks    Status  Achieved      PT LONG TERM GOAL #3   Title  Pt will be able to maintain SLS >15 sec bilateral LE for improved balance.    Baseline  >15 sec left, 12 sec right    Time  4    Period  Weeks    Status  Partially Met      PT LONG TERM GOAL #4   Title  Pt will ambulate >500' on varied surfaces independently with no significant deficits in gait quality for improved ambulation.    Baseline  Ambulated >600' on varied surfaces independently with no gait deficits noted.    Time  4    Period  Weeks    Status  Achieved            Plan - 01/18/20 1348    Clinical Impression Statement  Pt has progressed well with therapy. Denies any issues in home or community at this time. Pt met all goals except SLS goal just short of 15 sec on right leg. Pt continues to work on HEP at home and walking program. She is up to 1.3 miles and goal to get up to 3 miles. She walks independently without AD. PT discharging at this time.    Personal Factors and Comorbidities  Comorbidity 3+    Comorbidities  breast cancer s/p radiation and surgery in remission 4 years now, CAD s/p PCI, HTN, hyperlipidemia, new DM diagnosis    Examination-Activity Limitations  Locomotion Level;[employer]  Examination-Participation Restrictions   Community Activity    Stability/Clinical Decision Making  Stable/Uncomplicated    Rehab Potential  Good    PT Frequency  2x / week   followed by 1x/week for 2 weeks   PT Duration  2 weeks    PT Treatment/Interventions  ADLs/Self Care Home Management;Gait training;Stair training;Functional mobility training;Therapeutic activities;Therapeutic exercise;Patient/family education;Neuromuscular re-education;Balance training;Manual techniques    PT Next Visit Plan  D/c today    Consulted and Agree with Plan of Care  Patient       Patient will benefit from skilled therapeutic intervention in order to improve the following deficits and impairments:  Abnormal gait, Decreased balance  Visit Diagnosis: Other abnormalities of gait and mobility  Muscle weakness (generalized)     Problem List Patient Active Problem List   Diagnosis Date Noted  . Newly diagnosed diabetes ([place]) 11/19/2019  . Hx of completed stroke 11/12/2019  . Dizziness 01/03/2019  . Coronary artery disease due to lipid rich plaque 01/03/2019  . Sialoadenitis 04/04/2018  . Cough 02/21/2018  . Hematuria 08/11/2017  . Malignant neoplasm of overlapping sites of left breast in female, estrogen receptor positive ([place]) 03/28/2017  . Hearing loss due to cerumen impaction, right 12/23/2016  . Routine general medical examination at a health care facility 10/02/2016  . Unexplained weight gain 06/17/2016  . Hyperlipidemia associated with type 2 diabetes mellitus ([place]) 08/27/2015  . PMB (postmenopausal bleeding) 08/14/2015  . Fibroids 08/14/2015  . Cervical stenosis (uterine cervix) 08/14/2015  . Thickened endometrium 08/14/2015  . HTN (hypertension) 06/02/2013    Electa Sniff, PT, DPT, NCS 01/18/2020, 1:51 PM  [facility] [address] [place], [place], [address] Phone: [phone]   Fax:  [phone]  Name: Audrey Fischer MRN: [id number] Date of Birth:  Jul 08, 1956

## 2020-01-18 NOTE — Telephone Encounter (Signed)
PT called patient to check on her as did not come to last scheduled visit. Asked that she call to let us know if she was doing well and wanted to proceed with discharge or if she wanted to try to make up this visit. Cherly Anderson, PT, DPT, NCS

## 2020-01-24 ENCOUNTER — Telehealth: Payer: Self-pay | Admitting: Internal Medicine

## 2020-01-24 ENCOUNTER — Ambulatory Visit: Payer: BC Managed Care – PPO

## 2020-01-24 ENCOUNTER — Telehealth: Payer: Self-pay

## 2020-01-24 NOTE — Telephone Encounter (Signed)
Pt has been informed its ok to proceed.

## 2020-01-24 NOTE — Telephone Encounter (Signed)
We are recommending the COVID-19 vaccine to all of our patients. Cardiac medications (including blood thinners) should not deter anyone from being vaccinated and there is no need to hold any of those medications prior to vaccine administration.     Currently, there is a hotline to call (active 12/07/19) to schedule vaccination appointments as no walk-ins will be accepted.   Number: [phone].    If an appointment is not available please go to [email or web address] to sign up for notification when additional vaccine appointments are available.   If you have further questions or concerns about the vaccine process, please visit [email or web address] or contact your primary care physician.   

## 2020-01-24 NOTE — Telephone Encounter (Signed)
New message   The patient call has a COVID vaccine appt this Saturday with all her medication and health history will the vaccine be safe to take.    Please advise

## 2020-03-02 IMAGING — CT CT ANGIO NECK
1 of 11 series · 5 of 33 positions shown · IV contrast (omnipaque)
Comparison: MRI 11/12/2019

CLINICAL DATA: Stroke, follow-up

EXAM:
CT ANGIOGRAPHY HEAD AND NECK
TECHNIQUE: Multidetector CT imaging of the head and neck was performed using
the standard protocol during bolus administration of intravenous
contrast. Multiplanar CT image reconstructions and MIPs were
obtained to evaluate the vascular anatomy. Carotid stenosis
measurements (when applicable) are obtained utilizing NASCET
criteria, using the distal internal carotid diameter as the
denominator.
CONTRAST:  100mL OMNIPAQUE IOHEXOL 350 MG/ML SOLN

[Series 11: ax thins · axial · 0.39mm/px · z∈[-213,+4]mm · 5 of 327 slices shown]
[im 55/327  soft-tissue]
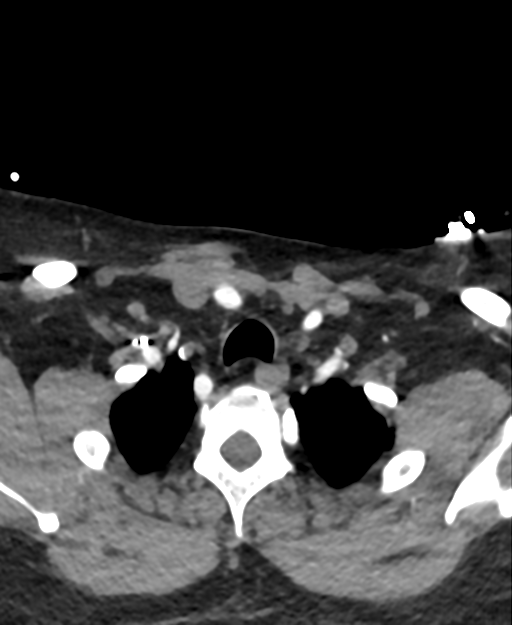
[im 109/327  bone]
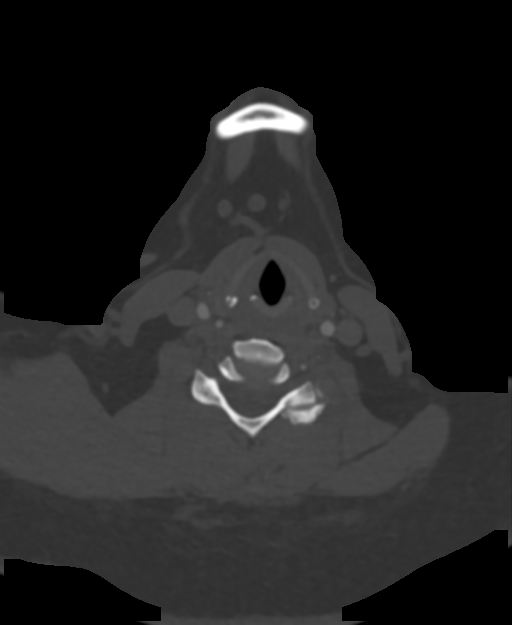
[im 164/327  soft-tissue]
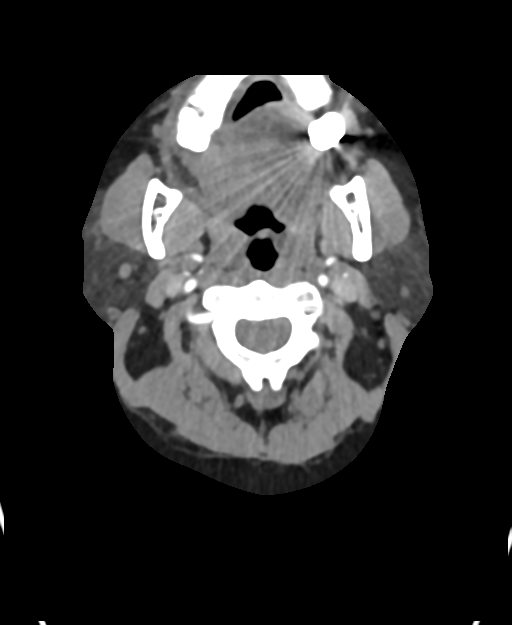
[im 218/327  bone]
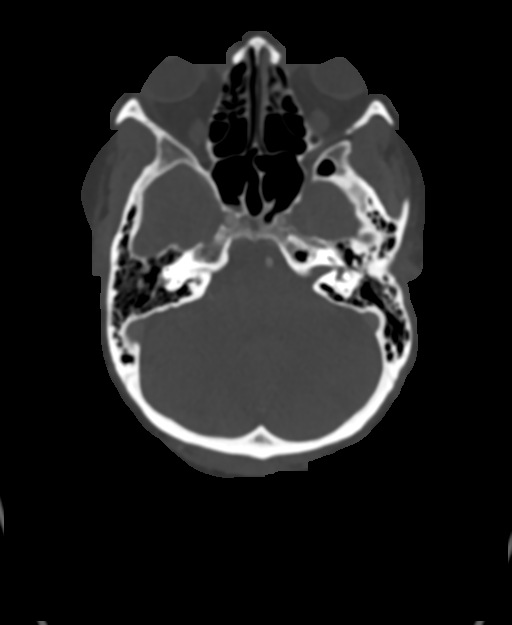
[im 272/327  soft-tissue]
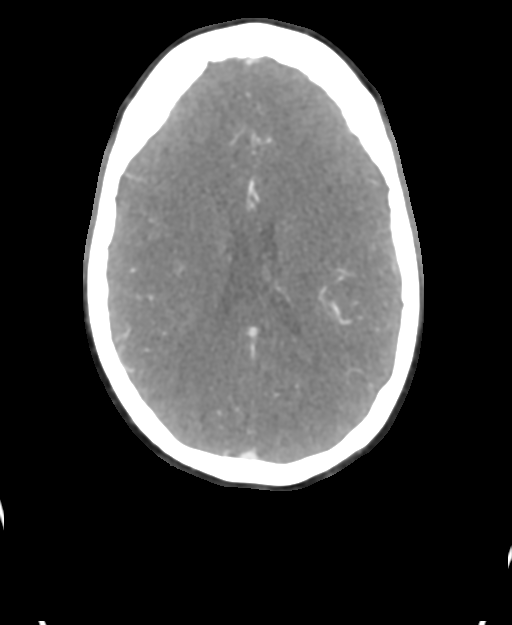

[5 of 33 positions shown; findings below may reference images not displayed]

FINDINGS: CT HEAD FINDINGS

Brain: Small area of ill-defined hypoattenuation is present
involving the left basal ganglia and adjacent white matter
corresponding to infarction on MRI. There is no acute intracranial
hemorrhage. Chronic cerebellar infarcts are present. There is a
hyperdense, partially calcified mass along the right anterior
clinoid process.

Vascular: Intracranial atherosclerotic calcification at the skull
base.

Skull: Unremarkable.

Sinuses: Mild patchy mucosal thickening.

Orbits: Unremarkable.

Review of the MIP images confirms the above findings

CTA NECK FINDINGS

Aortic arch: Great vessel origins are patent. There is aberrant
origin of the right subclavian artery, an anatomic variant.

Right carotid system: Common, internal, and external carotid
arteries are patent. There is mild noncalcified plaque along the
common carotid. Minimal plaque is present at the ICA origin without
measurable stenosis.

Left carotid system: Common, internal, and external carotid arteries
are patent. There is no measurable stenosis at the ICA origin.

Vertebral arteries: Patent.  Right vertebral artery is dominant.

Skeleton: Unremarkable.

Other neck: No neck mass or adenopathy.

Upper chest: No apical lung mass.

Review of the MIP images confirms the above findings

CTA HEAD FINDINGS

Anterior circulation: Intracranial internal carotid arteries are
patent with calcified plaque along the cavernous and paraclinoid
portions causing mild stenosis. Anterior and middle cerebral
arteries are patent. There is moderate to severe stenosis of the
proximal right A1 ACA. There is likely atherosclerotic irregularity
of the A2 and more distal ACA branches. There is moderate stenosis
of the right M1 MCA. There is likely atherosclerotic irregularity of
the M2 and more distal MCA branches. Stenosis ranges from mild to
moderate.

Posterior circulation: Intracranial vertebral arteries are patent.
Left vertebral artery becomes diminutive after origin of a PICA.
Basilar artery is patent. Posterior cerebral arteries are patent. A
right posterior communicating artery is present. Possible diminutive
left posterior communicating artery. There is atherosclerotic
irregularity of the P2 and more distal PCA branches. Stenosis
primarily ranges from mild to moderate. There is moderate to severe
stenosis of the proximal P2 PCA segments bilaterally.

Venous sinuses: As permitted by contrast timing, patent.

Review of the MIP images confirms the above findings
IMPRESSION: Evolving infarction of the left basal ganglia and adjacent white
matter. No acute intracranial hemorrhage.

No large vessel occlusion. No hemodynamically significant stenosis
in the neck. Multifocal intracranial atherosclerosis.

Suspected meningioma along the right anterior clinoid process.
Postcontrast MR imaging through the orbits is recommended.

## 2020-03-24 ENCOUNTER — Encounter: Payer: Self-pay | Admitting: Adult Health

## 2020-03-24 ENCOUNTER — Ambulatory Visit: Payer: BC Managed Care – PPO | Admitting: Adult Health

## 2020-03-24 VITALS — BP 152/92 | HR 70 | Temp 97.0°F | Ht 68.5 in | Wt 190.0 lb

## 2020-03-24 DIAGNOSIS — I10 Essential (primary) hypertension: Secondary | ICD-10-CM

## 2020-03-24 DIAGNOSIS — E785 Hyperlipidemia, unspecified: Secondary | ICD-10-CM | POA: Diagnosis not present

## 2020-03-24 DIAGNOSIS — E119 Type 2 diabetes mellitus without complications: Secondary | ICD-10-CM | POA: Diagnosis not present

## 2020-03-24 DIAGNOSIS — I6381 Other cerebral infarction due to occlusion or stenosis of small artery: Secondary | ICD-10-CM | POA: Diagnosis not present

## 2020-03-24 NOTE — Patient Instructions (Signed)
Continue exercises at home as recommended at discharge of therapy sessions  Continue aspirin 81 mg daily  and Crestor for secondary stroke prevention  Continue to follow up with PCP regarding cholesterol, blood pressure and diabetes management   Maintain strict control of hypertension with blood pressure goal below 130/90, diabetes with hemoglobin A1c goal below 6.5% and cholesterol with LDL cholesterol (bad cholesterol) goal below 70 mg/dL. I also advised the patient to eat a healthy diet with plenty of whole grains, cereals, fruits and vegetables, exercise regularly and maintain ideal body weight.  Followup in the future with me in 6 months or call earlier if needed       Thank you for coming to see [place] at [facility]. I hope we have been able to provide you high quality care today.  You may receive a patient satisfaction survey over the next few weeks. We would appreciate your feedback and comments so that we may continue to improve ourselves and the health of our patients.

## 2020-03-24 NOTE — Progress Notes (Signed)
[facility] [address] [place]. [place] [address] [phone]       STROKE FOLLOW UP NOTE  Audrey Fischer FILTER Date of Birth:  [birth year] Medical Record Number:  [id number]   Reason for Referral: stroke follow up    CHIEF COMPLAINT:  Chief Complaint  Patient presents with  . Follow-up    Left sided lacunar infarction ([place]) , rm 9, alone    HPI:  Today, 03/24/2020, Audrey Fischer returns for stroke follow-up.  Residual deficits of mild right thumb and pointer finger numbness sensation with ongoing improvement.  Denies residual balance difficulties. She does ambulate approx 3.4 miles per day without difficulty.  Continues on aspirin 81 mg daily and Crestor 40 mg daily for secondary stroke prevention.  Blood pressure today 152/92. Has not taken antihypertensives this morning. Monitors at home and typically stable SBP 110-130s.  Does not monitoring glucose levels at home.  Does have follow up visit this week with PCP to recheck lab work and plans on establishing care with endocrinologist in July. No concerns at this time.      History provided for reference purposes only Initial visit 12/25/2019 JM: Audrey Fischer is a 64 year old female who is being seen today for hospital follow-up.  Residual stroke deficits of mild right pointer finger numbness and mild imbalance but overall greatly recovering.  She continues to work with PT for ongoing benefit of high-level balance, gait and right hamstring strengthening.  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  She has completed 3 weeks DAPT and continues on aspirin alone without bleeding or bruising.  Continues on Crestor 40 mg daily without myalgias.  Lipid panel obtained yesterday with PCP which showed LDL 59 and triglycerides decreased from 2 64 to 1 50.  Blood pressure today satisfactory at 138/84.  She continues on Metformin 500 mg daily for DM management and ongoing follow-up with PCP.  She does  have a follow-up visit with PCP in 3 months and plans on obtaining A1c level at that time.  She does report initially feeling fatigued but this has since improved.  She will occasionally have insomnia but she feels as though this is typically with increased stress or anxiety.  Denies snoring or witnessed apneic events.  No further concerns at this time.  Stroke admission 11/13/2019: Audrey Fischer is a 64 y.o. female with history of CAD with NSTEMI s/p stent 01/2008, HTN, HLD, breast Cancer in 2016 now on anastrozole admitted on 11/13/2019 for right sided weakness and right hand numbness.  Evaluated by Dr. Erlinda Hong and stroke team with stroke work-up revealing left AchA territory small lacunar infarct (left PLIC and CR infarct) as evidenced on MRI secondary to small vessel disease source.  CTA head/neck showed right M1, left M2 and M3 stenosis.  2D echo showed an EF of 60 to 65%.  Recommended DAPT for 3 weeks and aspirin alone.  New diagnosis of uncontrolled DM with A1c 9.7 and recommended close PCP follow-up for better DM control.  HTN stable on the high side with long-term BP goal normotensive range.  LDL 202 and initiated Crestor 40 mg daily.  Other stroke risk factors include EtOH use, obesity and CAD status post stenting.  Other active problems include breast cancer 2016 status post sx and rx, now on anastrozole.  No prior history of stroke.  Evaluated by therapies and recommended discharge home with outpatient PT/OT with residual right hand dexterity difficulty.  Stroke:  left AchA territory small lacunar infarct, likely secondary to small vessel disease source  Resultant right hand dexterity difficulty  MRI  Left PLIC and CR infarct  CTA head andn neck showed right M1, left M2 and M3 stenosis  2D Echo EF 60-65%  LDL 202  HgbA1c 9.7  SCDs for VTE prophylaxis  No antithrombotic prior to admission, now on aspirin 81 mg daily and clopidogrel 75 mg daily. Continue DAPT for 3 weeks and then ASA  alsone   Patient counseled to be compliant with her antithrombotic medications  Ongoing aggressive stroke risk factor management  Therapy recommendations:  outpt PT/OT  Disposition:  home      ROS:   14 system review of systems performed and negative with exception of numbness  PMH:  Past Medical History:  Diagnosis Date  . Breast cancer ([place]) 07/09/15   left breast,  lower-inner quadrant   . Coronary artery disease    cardiologist-  dr Donnetta Hutching [facility], [facility] in [facility])---  hx PCI w/ stents to OM and LAD 02-08-2008  . Hyperlipidemia   . Hypertension   . PMB (postmenopausal bleeding)   . S/P coronary artery stent placement     PSH:  Past Surgical History:  Procedure Laterality Date  . CESAREAN SECTION  10-09-1996  . CORONARY ANGIOPLASTY WITH STENT PLACEMENT  02-08-2008   dr Donnetta Hutching   PCI and stent to OM and LAD  . DILATATION & CURRETTAGE/HYSTEROSCOPY WITH RESECTOCOPE N/A 08/15/2015   Procedure: DILATATION & CURETTAGE/HYSTEROSCOPY ;  Surgeon: Eldred Manges, MD;  Location: [place];  Service: Gynecology;  Laterality: N/A;  . DILATION AND CURETTAGE OF UTERUS  1994   blighted ovum    Social History:  Social History   Socioeconomic History  . Marital status: Single    Spouse name: Not on file  . Number of children: Not on file  . Years of education: Not on file  . Highest education level: Not on file  Occupational History  . Not on file  Tobacco Use  . Smoking status: Never Smoker  . Smokeless tobacco: Never Used  Substance and Sexual Activity  . Alcohol use: Yes    Alcohol/week: 0.0 standard drinks    Comment: occasionally  . Drug use: No  . Sexual activity: Not on file  Other Topics Concern  . Not on file  Social History Narrative  . Not on file   Social Determinants of Health   Financial Resource Strain:   . Difficulty of Paying Living Expenses:   Food Insecurity:   . Worried About Sales executive in the Last Year:   . Arboriculturist in the Last Year:   Transportation Needs:   . Film/video editor (Medical):   Marland Kitchen Lack of Transportation (Non-Medical):   Physical Activity:   . Days of Exercise per Week:   . Minutes of Exercise per Session:   Stress:   . Feeling of Stress :   Social Connections:   . Frequency of Communication with Friends and Family:   . Frequency of Social Gatherings with Friends and Family:   . Attends Religious Services:   . Active Member of Clubs or Organizations:   . Attends Archivist Meetings:   Marland Kitchen Marital Status:   Intimate Partner Violence:   . Fear of Current or Ex-Partner:   . Emotionally Abused:   Marland Kitchen Physically Abused:   . Sexually Abused:     Family History:  Family History  Problem Relation Age of Onset  . Heart disease Mother   . Stroke Mother   . Colon cancer Mother 61  . Heart disease Father     Medications:   Current Outpatient Medications on File Prior to Visit  Medication Sig Dispense Refill  . anastrozole (ARIMIDEX) 1 MG tablet Take 1 tablet (1 mg total) by mouth daily. (Patient taking differently: Take 1 mg by mouth at bedtime. ) 90 tablet 4  . Ascorbic Acid (VITAMIN C GUMMIE PO) Take 2,000 mg by mouth daily.    Marland Kitchen aspirin 81 MG tablet Take 81 mg by mouth daily.    Marland Kitchen atenolol (TENORMIN) 100 MG tablet Take 50 mg by mouth at bedtime.   5  . Cholecalciferol (VITAMIN D3) 2000 UNITS TABS Take 1 tablet by mouth daily.    . metFORMIN (GLUCOPHAGE) 500 MG tablet Take 1 tablet (500 mg total) by mouth daily with breakfast. 90 tablet 3  . nitroGLYCERIN (NITROSTAT) 0.4 MG SL tablet Place 0.4 mg under the tongue every 5 (five) minutes as needed for chest pain.     . vitamin B-12 (CYANOCOBALAMIN) 500 MCG tablet Take 1 tablet (500 mcg total) by mouth daily.    . rosuvastatin (CRESTOR) 40 MG tablet Take 1 tablet (40 mg total) by mouth daily. 90 tablet 0   No current facility-administered medications on file prior to visit.     Allergies:   Allergies  Allergen Reactions  . Capsule [Gelatin] Hives    Any medication that is in gel capsule form  . Capsaicin Other (See Comments)    Unknown rxn     Vitals  Vitals:   03/24/20 0748  BP: (!) 152/92  Pulse: 70  Temp: (!) 97 F (36.1 C)  Weight: 190 lb (86.2 kg)  Height: 5' 8.5" (1.74 m)   Body mass index is 28.47 kg/m.  Physical exam  General: well developed, well nourished, pleasant middle-age African-American female, seated, in no evident distress Head: head normocephalic and atraumatic.   Neck: supple with no carotid or supraclavicular bruits Cardiovascular: regular rate and rhythm, no murmurs Musculoskeletal: no deformity Skin:  no rash/petichiae Vascular:  Normal pulses all extremities   Neurologic Exam Mental Status: Awake and fully alert.   Normal speech and language.  Oriented to place and time. Recent and remote memory intact. Attention span, concentration and fund of knowledge appropriate. Mood and affect appropriate.  Cranial Nerves: Pupils equal, briskly reactive to light. Extraocular movements full without nystagmus. Visual fields full to confrontation. Hearing intact. Facial sensation intact. Face, tongue, palate moves normally and symmetrically.  Motor: Normal bulk and tone. Normal strength in all tested extremity muscles. Sensory.: intact to touch , pinprick , position and vibratory sensation.  Coordination: Rapid alternating movements normal in all extremities. Finger-to-nose and heel-to-shin performed accurately bilaterally. Gait and Station: Arises from chair without difficulty. Stance is normal. Gait demonstrates normal stride length and balance.  Able to perform tandem walk and walking on toes and on heels without difficulty Reflexes: 1+ and symmetric. Toes downgoing.         ASSESSMENT: Audrey Fischer is a 64 y.o. year old female presented with right-sided weakness and right hand numbness on 11/12/2019 with stroke work-up  revealing left AchA territory small lacunar infarct likely secondary to small vessel disease source. Vascular risk factors include CAD with NSTEMI s/p stenting, HTN, HLD, uncontrolled DM, intracranial stenosis, obesity and EtOH use.  Greatly recovered from a stroke standpoint with resolution of prior  imbalance and only residual deficit of subjective right thumb and pointer finger numbness    PLAN:  1. Left AchA territory infarct:  -Continue aspirin 81 mg daily  and Crestor 40 mg daily for secondary stroke prevention. -Maintain strict control of hypertension with blood pressure goal below 130/90, diabetes with hemoglobin A1c goal below 6.5% and cholesterol with LDL cholesterol (bad cholesterol) goal below 70 mg/dL.  I also advised the patient to eat a healthy diet with plenty of whole grains, cereals, fruits and vegetables, exercise regularly with at least 30 minutes of continuous activity daily and maintain ideal body weight. 2. HTN: Advised importance of taking antihypertensives at the same time every morning and ensure ongoing monitoring at home.  Continue to follow with PCP/cardiology for ongoing management 3. HLD: Continue Crestor 40 mg daily and ongoing prescribing, monitoring and management by PCP 4. DMII: Plans on establishing care with endocrinology but does have follow-up this week with PCP for repeat A1c 5. Intracranial stenosis: Currently asymptomatic and advised to continue aspirin and Crestor as well as importance of managing stroke risk factors.  No indication for surveillance monitoring at this time as current treatment plan would not change 6. CAD s/p stent: Continue to follow with cardiology as scheduled    Follow up in 6 months or call earlier if needed   I spent 26 minutes of face-to-face and non-face-to-face time with patient.  This included previsit chart review, lab review, study review, order entry, electronic health record documentation, patient education regarding prior  stroke, importance of managing stroke risk factors and answered all questions to patient satisfaction     Frann Rider, [facility]  [facility] [address] [place], [place] [address]  Phone [phone] Fax [phone] Note: This document was prepared with digital dictation and possible smart phrase technology. Any transcriptional errors that result from this process are unintentional.

## 2020-03-26 ENCOUNTER — Encounter: Payer: Self-pay | Admitting: Internal Medicine

## 2020-03-26 ENCOUNTER — Ambulatory Visit: Payer: BC Managed Care – PPO | Admitting: Internal Medicine

## 2020-03-26 ENCOUNTER — Other Ambulatory Visit: Payer: Self-pay

## 2020-03-26 VITALS — BP 134/80 | HR 60 | Temp 98.0°F | Ht 68.5 in | Wt 190.0 lb

## 2020-03-26 VITALS — BP 128/88 | HR 57 | Ht 68.5 in | Wt 190.0 lb

## 2020-03-26 DIAGNOSIS — I2510 Atherosclerotic heart disease of native coronary artery without angina pectoris: Secondary | ICD-10-CM

## 2020-03-26 DIAGNOSIS — Z170 Estrogen receptor positive status [ER+]: Secondary | ICD-10-CM

## 2020-03-26 DIAGNOSIS — I10 Essential (primary) hypertension: Secondary | ICD-10-CM

## 2020-03-26 DIAGNOSIS — I214 Non-ST elevation (NSTEMI) myocardial infarction: Secondary | ICD-10-CM | POA: Diagnosis not present

## 2020-03-26 DIAGNOSIS — E118 Type 2 diabetes mellitus with unspecified complications: Secondary | ICD-10-CM | POA: Diagnosis not present

## 2020-03-26 DIAGNOSIS — Z8673 Personal history of transient ischemic attack (TIA), and cerebral infarction without residual deficits: Secondary | ICD-10-CM

## 2020-03-26 DIAGNOSIS — C50812 Malignant neoplasm of overlapping sites of left female breast: Secondary | ICD-10-CM

## 2020-03-26 DIAGNOSIS — Z9861 Coronary angioplasty status: Secondary | ICD-10-CM

## 2020-03-26 DIAGNOSIS — E119 Type 2 diabetes mellitus without complications: Secondary | ICD-10-CM

## 2020-03-26 DIAGNOSIS — E1169 Type 2 diabetes mellitus with other specified complication: Secondary | ICD-10-CM

## 2020-03-26 DIAGNOSIS — E785 Hyperlipidemia, unspecified: Secondary | ICD-10-CM

## 2020-03-26 LAB — BASIC METABOLIC PANEL
BUN: 25 mg/dL — ABNORMAL HIGH (ref 6–23)
CO2: 25 mEq/L (ref 19–32)
Calcium: 10.4 mg/dL (ref 8.4–10.5)
Chloride: 104 mEq/L (ref 96–112)
Creatinine, Ser: 1.65 mg/dL — ABNORMAL HIGH (ref 0.40–1.20)
GFR: 38.2 mL/min — ABNORMAL LOW (ref >60.00)
Glucose, Bld: 135 mg/dL — ABNORMAL HIGH (ref 70–99)
Potassium: 4.2 mEq/L (ref 3.5–5.1)
Sodium: 138 mEq/L (ref 135–145)

## 2020-03-26 MED ORDER — ROSUVASTATIN CALCIUM 40 MG PO TABS: 40.0000 mg | ORAL_TABLET | Freq: Every day | ORAL | 3 refills | Status: DC

## 2020-03-26 NOTE — Progress Notes (Signed)
   Subjective:   Patient ID: Audrey Fischer, female    DOB: Jul 03, 1956, 64 y.o.   MRN: [id number]  HPI The patient is a 64 YO female coming in for follow up of her diabetes. She was started on metformin 500 mg daily in the hospital with stroke. She has worked hard on diet to make changes including giving up sugary drinks and working on carbohydrates. She has done well. Overall recovering well from stroke and back at work for several months now. Still a small patch on her right hand of tingling which is perhaps slightly smaller than prior. Denies low sugars. Not checking on regular basis.   Review of Systems  Constitutional: Negative.   HENT: Negative.   Eyes: Negative.   Respiratory: Negative for cough, chest tightness and shortness of breath.   Cardiovascular: Negative for chest pain, palpitations and leg swelling.  Gastrointestinal: Negative for abdominal distention, abdominal pain, constipation, diarrhea, nausea and vomiting.  Musculoskeletal: Negative.   Skin: Negative.   Neurological: Negative.   Psychiatric/Behavioral: Negative.     Objective:  Physical Exam Constitutional:      Appearance: She is well-developed.  HENT:     Head: Normocephalic and atraumatic.  Cardiovascular:     Rate and Rhythm: Normal rate and regular rhythm.  Pulmonary:     Effort: Pulmonary effort is normal. No respiratory distress.     Breath sounds: Normal breath sounds. No wheezing or rales.  Abdominal:     General: Bowel sounds are normal. There is no distension.     Palpations: Abdomen is soft.     Tenderness: There is no abdominal tenderness. There is no rebound.  Musculoskeletal:     Cervical back: Normal range of motion.  Skin:    General: Skin is warm and dry.  Neurological:     Mental Status: She is alert and oriented to person, place, and time. Mental status is at baseline.     Sensory: Sensory deficit present.     Coordination: Coordination normal.     Vitals:   03/26/20 0939  BP:  134/80  Pulse: 60  Temp: 98 F (36.7 C)  SpO2: 99%  Weight: 190 lb (86.2 kg)  Height: 5' 8.5" (1.74 m)    This visit occurred during the SARS-CoV-2 public health emergency.  Safety protocols were in place, including screening questions prior to the visit, additional usage of staff PPE, and extensive cleaning of exam room while observing appropriate contact time as indicated for disinfecting solutions.   Assessment & Plan:

## 2020-03-26 NOTE — Patient Instructions (Signed)
Medication Instructions:   Your physician recommends that you continue on your current medications as directed. Please refer to the Current Medication list given to you today.  *If you need a refill on your cardiac medications before your next appointment, please call your pharmacy*  Lab Work:  None ordered today  Testing/Procedures:  None ordered today  Follow-Up: At [facility], you and your health needs are our priority.  As part of our continuing mission to provide you with exceptional heart care, we have created designated Provider Care Teams.  These Care Teams include your primary Cardiologist (physician) and Advanced Practice Providers (APPs -  Physician Assistants and Nurse Practitioners) who all work together to provide you with the care you need, when you need it.  We recommend signing up for the patient portal called "MyChart".  Sign up information is provided on this After Visit Summary.  MyChart is used to connect with patients for Virtual Visits (Telemedicine).  Patients are able to view lab/test results, encounter notes, upcoming appointments, etc.  Non-urgent messages can be sent to your provider as well.   To learn more about what you can do with MyChart, go to [email or web address].    Your next appointment:   6 month(s)  The format for your next appointment:   In Person  Provider:   You may see Elouise Munroe, MD or one of the following Advanced Practice Providers on your designated Care Team:    Rosaria Ferries, PA-C  Jory Sims, DNP, ANP  Cadence Kathlen Mody, NP

## 2020-03-26 NOTE — Patient Instructions (Signed)
The HgA1c is 6.7 today which is great. No changes needed to the medicine.

## 2020-03-26 NOTE — Progress Notes (Signed)
Cardiology Office Note:    Date:  03/26/2020   ID:  Audrey Fischer, DOB 1956/06/17, MRN [id number]  PCP:  Hoyt Koch, MD  Cardiologist:  Elouise Munroe, MD  Electrophysiologist:  None   Referring MD: Hoyt Koch, *   Chief Complaint: F/u HTN, CAD s/p NSTEMI, recent stroke  History of Present Illness:    Audrey Fischer is a 64 y.o. female with a history of NSTEMI in March 2009 with LAD and OM stents, hypertension, hyperlipidemia, and history of breast cancer on anastrozole. She also has a recent history of stroke in Dec 2020. In December 2020 she suffered a stroke with symptoms starting on December 13 presenting with right hand numbness and tingling as well as gait disturbance. MRI showed acute infarct in the posterior external capsule on the left. She was recommended to take Plavix for 3 weeks in addition to indefinite aspirin therapy 81 mg daily. Crestor was increased to 40 mg daily. She was newly diagnosed with diabetes with an A1c of 9.7.  Our last visit was 12/27/19. We discussed possible SGLT2I therapy as well as transition from atenolol to carvedilol. She deferred those changes at that time.   She is doing remarkably well today. HbA1C is now 6.8%. Lipids have improved with LDL 59 (down from >200). She feels she has regained nearly all function after stroke.   She denies chest pain, chest pressure, dyspnea at rest or with exertion, palpitations, PND, orthopnea, or leg swelling. Denies cough, fever, chills. Denies nausea, vomiting. Denies syncope or presyncope. Denies dizziness or lightheadedness.   Daughter recently got engaged, getting married in [place]. She is looking forward to this event.  Past Medical History:  Diagnosis Date  . Breast cancer ([place]) 07/09/15   left breast,  lower-inner quadrant   . Coronary artery disease    cardiologist-  dr Donnetta Hutching [facility], [facility] cardiology in [facility] [place])---  hx PCI w/ stents to OM and LAD  02-08-2008  . Hyperlipidemia   . Hypertension   . PMB (postmenopausal bleeding)   . S/P coronary artery stent placement     Past Surgical History:  Procedure Laterality Date  . CESAREAN SECTION  10-09-1996  . CORONARY ANGIOPLASTY WITH STENT PLACEMENT  02-08-2008   dr Donnetta Hutching   PCI and stent to OM and LAD  . DILATATION & CURRETTAGE/HYSTEROSCOPY WITH RESECTOCOPE N/A 08/15/2015   Procedure: DILATATION & CURETTAGE/HYSTEROSCOPY ;  Surgeon: Eldred Manges, MD;  Location: [facility];  Service: Gynecology;  Laterality: N/A;  . DILATION AND CURETTAGE OF UTERUS  1994   blighted ovum    Current Medications: Current Meds  Medication Sig  . anastrozole (ARIMIDEX) 1 MG tablet Take 1 tablet (1 mg total) by mouth daily.  . Ascorbic Acid (VITAMIN C GUMMIE PO) Take 2,000 mg by mouth daily.  Marland Kitchen aspirin 81 MG tablet Take 81 mg by mouth daily.  Marland Kitchen atenolol (TENORMIN) 100 MG tablet Take 50 mg by mouth at bedtime.   . Cholecalciferol (VITAMIN D3) 2000 UNITS TABS Take 1 tablet by mouth daily.  . metFORMIN (GLUCOPHAGE) 500 MG tablet Take 1 tablet (500 mg total) by mouth daily with breakfast.  . nitroGLYCERIN (NITROSTAT) 0.4 MG SL tablet Place 0.4 mg under the tongue every 5 (five) minutes as needed for chest pain.   . vitamin B-12 (CYANOCOBALAMIN) 500 MCG tablet Take 1 tablet (500 mcg total) by mouth daily.     Allergies:   Capsule [gelatin] and Capsaicin  Social History   Socioeconomic History  . Marital status: Single    Spouse name: Not on file  . Number of children: Not on file  . Years of education: Not on file  . Highest education level: Not on file  Occupational History  . Not on file  Tobacco Use  . Smoking status: Never Smoker  . Smokeless tobacco: Never Used  Substance and Sexual Activity  . Alcohol use: Yes    Alcohol/week: 0.0 standard drinks    Comment: occasionally  . Drug use: No  . Sexual activity: Not on file  Other Topics Concern  . Not on file  Social  History Narrative  . Not on file   Social Determinants of Health   Financial Resource Strain:   . Difficulty of Paying Living Expenses:   Food Insecurity:   . Worried About Charity fundraiser in the Last Year:   . Arboriculturist in the Last Year:   Transportation Needs:   . Film/video editor (Medical):   Marland Kitchen Lack of Transportation (Non-Medical):   Physical Activity:   . Days of Exercise per Week:   . Minutes of Exercise per Session:   Stress:   . Feeling of Stress :   Social Connections:   . Frequency of Communication with Friends and Family:   . Frequency of Social Gatherings with Friends and Family:   . Attends Religious Services:   . Active Member of Clubs or Organizations:   . Attends Archivist Meetings:   Marland Kitchen Marital Status:      Family History: The patient's family history includes Colon cancer (age of onset: 57) in her mother; Heart disease in her father and mother; Stroke in her mother.  ROS:   Please see the history of present illness.    All other systems reviewed and are negative.  EKGs/Labs/Other Studies Reviewed:    The following studies were reviewed today:  EKG:  n/a  Recent Labs: 12/24/2019: ALT 24; Hemoglobin 13.4; Platelets 262.0 03/26/2020: BUN 25; Creatinine, Ser 1.65; Potassium 4.2; Sodium 138  Recent Lipid Panel    Component Value Date/Time   CHOL 127 12/24/2019 1603   TRIG 150.0 (H) 12/24/2019 1603   HDL 37.80 (L) 12/24/2019 1603   CHOLHDL 3 12/24/2019 1603   VLDL 30.0 12/24/2019 1603   LDLCALC 59 12/24/2019 1603    Physical Exam:    VS:  BP 128/88   Pulse (!) 57   Ht 5' 8.5" (1.74 m)   Wt 190 lb (86.2 kg)   SpO2 97%   BMI 28.47 kg/m     Wt Readings from Last 5 Encounters:  03/26/20 190 lb (86.2 kg)  03/26/20 190 lb (86.2 kg)  03/24/20 190 lb (86.2 kg)  12/27/19 197 lb (89.4 kg)  12/25/19 197 lb (89.4 kg)     Constitutional: No acute distress Eyes: sclera non-icteric, normal conjunctiva and lids ENMT: mask in  place Cardiovascular: regular rhythm, normal rate, no murmurs. S1 and S2 normal. Radial pulses normal bilaterally. No jugular venous distention.  Respiratory: clear to auscultation bilaterally GI : normal bowel sounds, soft and nontender. No distention.   MSK: extremities warm, well perfused. No edema.  NEURO: grossly nonfocal exam, moves all extremities. PSYCH: alert and oriented x 3, normal mood and affect.   ASSESSMENT:    1. Coronary artery disease involving native coronary artery of native heart without angina pectoris   2. History of percutaneous coronary intervention   3. Essential hypertension  4. Non-ST elevation (NSTEMI) myocardial infarction ([place])   5. Newly diagnosed diabetes ([place])   6. Hyperlipidemia associated with type 2 diabetes mellitus ([place])   7. Hx of completed stroke   8. Malignant neoplasm of overlapping sites of left breast in female, estrogen receptor positive ([place])    PLAN:    Non-ST elevation (NSTEMI) myocardial infarction [facility]) Coronary artery disease involving native coronary artery of native heart without angina pectoris History of percutaneous coronary intervention - continue ASA 81 daily, BB, crestor - no chest pain  Essential hypertension - continue BB, well controlled at this time.   Newly diagnosed diabetes ([place]) - A1C has greatly improved, patient has made great efforts to reduced A1C. Continue diet/lifestyle measures. Remainder per PCP.   Hyperlipidemia associated with type 2 diabetes mellitus ([place]) - lipids now at goal. Continue crestor.   Hx of completed stroke - feels she has no residual deficits, writing has improved.   Total time of encounter: 30 minutes total time of encounter, including 19 minutes spent in face-to-face patient care on the date of this encounter. This time includes coordination of care and counseling regarding above mentioned problem list. Remainder of non-face-to-face time involved reviewing chart documents/testing  relevant to the patient encounter and documentation in the medical record. I have independently reviewed documentation from referring provider.   Cherlynn Kaiser, MD [place]  [facility]    Medication Adjustments/Labs and Tests Ordered: Current medicines are reviewed at length with the patient today.  Concerns regarding medicines are outlined above.  No orders of the defined types were placed in this encounter.  Meds ordered this encounter  Medications  . rosuvastatin (CRESTOR) 40 MG tablet    Sig: Take 1 tablet (40 mg total) by mouth daily.    Dispense:  90 tablet    Refill:  3    Patient Instructions  Medication Instructions:   Your physician recommends that you continue on your current medications as directed. Please refer to the Current Medication list given to you today.  *If you need a refill on your cardiac medications before your next appointment, please call your pharmacy*  Lab Work:  None ordered today  Testing/Procedures:  None ordered today  Follow-Up: At [facility], you and your health needs are our priority.  As part of our continuing mission to provide you with exceptional heart care, we have created designated Provider Care Teams.  These Care Teams include your primary Cardiologist (physician) and Advanced Practice Providers (APPs -  Physician Assistants and Nurse Practitioners) who all work together to provide you with the care you need, when you need it.  We recommend signing up for the patient portal called "MyChart".  Sign up information is provided on this After Visit Summary.  MyChart is used to connect with patients for Virtual Visits (Telemedicine).  Patients are able to view lab/test results, encounter notes, upcoming appointments, etc.  Non-urgent messages can be sent to your provider as well.   To learn more about what you can do with MyChart, go to [email or web address].    Your next appointment:   6 month(s)  The format for your  next appointment:   In Person  Provider:   You may see Elouise Munroe, MD or one of the following Advanced Practice Providers on your designated Care Team:    Rosaria Ferries, PA-C  Jory Sims, DNP, ANP  Cadence Kathlen Mody, NP

## 2020-03-27 LAB — POCT GLYCOSYLATED HEMOGLOBIN (HGB A1C)
HbA1c POC (<> result, manual entry): 6.7 % (ref 4.0–5.6)
HbA1c, POC (controlled diabetic range): 6.7 % (ref 0.0–7.0)
HbA1c, POC (prediabetic range): 6.7 % — AB (ref 5.7–6.4)
Hemoglobin A1C: 6.7 % — AB (ref 4.0–5.6)

## 2020-03-27 NOTE — Assessment & Plan Note (Signed)
Function stable to gradual improvement. Reminded her that she could have continued improvement over the next year or so.

## 2020-03-27 NOTE — Assessment & Plan Note (Signed)
Complicated by past stroke and CKD. Taking metformin 500 mg daily. On statin. Given her recent AKI on labs would not initiate ACE-I or ARB currently until renal function stabilizes. HgA1c checked in office at 6.7 with great control on her metformin 500 mg daily. Continue good work with diet changes.

## 2020-03-27 NOTE — Assessment & Plan Note (Signed)
Needs repeat BMP today as last was with reduced function. She has had CKD stage 3a in the past but most recent was 3b. She is not on any diuretics which should impact renal function.

## 2020-03-31 NOTE — Progress Notes (Signed)
I agree with the above plan 

## 2020-08-26 ENCOUNTER — Telehealth: Payer: Self-pay | Admitting: *Deleted

## 2020-08-26 NOTE — Telephone Encounter (Signed)
A message was left, re: her follow up visit. 

## 2020-09-02 ENCOUNTER — Telehealth: Payer: Self-pay | Admitting: Internal Medicine

## 2020-09-02 NOTE — Telephone Encounter (Signed)
lvm for patient to return call to get follow up scheduled with Acharya from recall list 

## 2020-09-18 ENCOUNTER — Ambulatory Visit (INDEPENDENT_AMBULATORY_CARE_PROVIDER_SITE_OTHER): Payer: BC Managed Care – PPO | Admitting: Internal Medicine

## 2020-09-18 ENCOUNTER — Encounter: Payer: Self-pay | Admitting: Internal Medicine

## 2020-09-18 ENCOUNTER — Other Ambulatory Visit: Payer: Self-pay

## 2020-09-18 VITALS — BP 144/90 | HR 56 | Ht 68.5 in | Wt 184.8 lb

## 2020-09-18 DIAGNOSIS — Z9861 Coronary angioplasty status: Secondary | ICD-10-CM | POA: Diagnosis not present

## 2020-09-18 DIAGNOSIS — I214 Non-ST elevation (NSTEMI) myocardial infarction: Secondary | ICD-10-CM | POA: Diagnosis not present

## 2020-09-18 DIAGNOSIS — E1169 Type 2 diabetes mellitus with other specified complication: Secondary | ICD-10-CM

## 2020-09-18 DIAGNOSIS — I2510 Atherosclerotic heart disease of native coronary artery without angina pectoris: Secondary | ICD-10-CM

## 2020-09-18 DIAGNOSIS — E785 Hyperlipidemia, unspecified: Secondary | ICD-10-CM

## 2020-09-18 DIAGNOSIS — Z8673 Personal history of transient ischemic attack (TIA), and cerebral infarction without residual deficits: Secondary | ICD-10-CM

## 2020-09-18 DIAGNOSIS — I10 Essential (primary) hypertension: Secondary | ICD-10-CM | POA: Diagnosis not present

## 2020-09-18 NOTE — Patient Instructions (Signed)
Medication Instructions:  No Changes In Medications at this time.  *If you need a refill on your cardiac medications before your next appointment, please call your pharmacy*  Lab Work: None Ordered At This Time.  If you have labs (blood work) drawn today and your tests are completely normal, you will receive your results only by: . MyChart Message (if you have MyChart) OR . A paper copy in the mail If you have any lab test that is abnormal or we need to change your treatment, we will call you to review the results.  Testing/Procedures: None Ordered At This Time.   Follow-Up: At [facility], you and your health needs are our priority.  As part of our continuing mission to provide you with exceptional heart care, we have created designated Provider Care Teams.  These Care Teams include your primary Cardiologist (physician) and Advanced Practice Providers (APPs -  Physician Assistants and Nurse Practitioners) who all work together to provide you with the care you need, when you need it.  We recommend signing up for the patient portal called "MyChart".  Sign up information is provided on this After Visit Summary.  MyChart is used to connect with patients for Virtual Visits (Telemedicine).  Patients are able to view lab/test results, encounter notes, upcoming appointments, etc.  Non-urgent messages can be sent to your provider as well.   To learn more about what you can do with MyChart, go to [email or web address].    Your next appointment:   6 month(s)  The format for your next appointment:   In Person  Provider:   Gayatri Acharya, MD  

## 2020-09-24 ENCOUNTER — Ambulatory Visit: Payer: BC Managed Care – PPO | Admitting: Adult Health

## 2020-09-24 ENCOUNTER — Encounter: Payer: Self-pay | Admitting: Adult Health

## 2020-09-24 VITALS — BP 133/87 | HR 59 | Ht 68.0 in | Wt 183.0 lb

## 2020-09-24 DIAGNOSIS — E785 Hyperlipidemia, unspecified: Secondary | ICD-10-CM | POA: Diagnosis not present

## 2020-09-24 DIAGNOSIS — I6381 Other cerebral infarction due to occlusion or stenosis of small artery: Secondary | ICD-10-CM | POA: Diagnosis not present

## 2020-09-24 DIAGNOSIS — E119 Type 2 diabetes mellitus without complications: Secondary | ICD-10-CM | POA: Diagnosis not present

## 2020-09-24 DIAGNOSIS — I10 Essential (primary) hypertension: Secondary | ICD-10-CM

## 2020-09-24 NOTE — Patient Instructions (Addendum)
Continue aspirin 81 mg daily  and Crestor 40 mg daily for secondary stroke prevention  Continue to follow up with PCP regarding cholesterol and blood pressure management  Continue to follow with endocrinology for diabetic monitoring and management Maintain strict control of hypertension with blood pressure goal below 130/90, diabetes with hemoglobin A1c goal below 7% and cholesterol with LDL cholesterol (bad cholesterol) goal below 70 mg/dL.     Overall stable from stroke standpoint and recommend follow-up on an as-needed basis regarding your prior stroke      Thank you for coming to see [place] at [facility]. I hope we have been able to provide you high quality care today.  You may receive a patient satisfaction survey over the next few weeks. We would appreciate your feedback and comments so that we may continue to improve ourselves and the health of our patients.   Warning Signs of a Stroke  A stroke is a medical emergency and should be treated right away--every second counts. A stroke is caused by a decrease or block in blood flow to the brain. When this occurs, certain areas of the brain do not get enough oxygen, and brain cells begin to die. A stroke can lead to brain damage and can sometimes be life-threatening. However, if someone having a stroke gets medical treatment right away, he or she has better chances of surviving and recovering from the stroke. Being able to recognize the symptoms of a stroke is very important. Types of strokes There are two main types of strokes:  Ischemic strokes. This is the most common type of stroke. These strokes happen when a blood vessel that supplies blood to the brain is being blocked.  Hemorrhagic strokes. These strokes result from bleeding in the brain due to a blood vessel leaking or bursting (rupturing). A transient ischemic attack (TIA) is a "warning stroke" that causes stroke-like symptoms that go away quickly. Unlike a  stroke, a TIA does not cause permanent damage to the brain. However, the symptoms of a TIA are the same as a stroke, and they also require medical treatment right away. Having a TIA is a sign that you are at higher risk for a permanent stroke. Warning signs of a stroke The symptoms of stroke may vary and will reflect the part of the brain that is involved. Symptoms usually happen suddenly. "BE FAST" is an easy way to remember the main warning signs of a stroke. B - Balance Signs are dizziness, sudden trouble walking, or loss of balance. E - Eyes Signs are trouble seeing or a sudden change in vision. F - Face Signs are sudden weakness or numbness of the face, or the face or eyelid drooping on one side. A - Arms Signs are weakness or numbness in an arm. This happens suddenly and usually on one side of the body. S - Speech Signs are sudden trouble speaking, slurred speech, or trouble understanding what people say. T - Time Time to call emergency services. Write down what time symptoms started. Other signs of a stroke Some less common signs of a stroke include:  A sudden, severe headache with no known cause.  Nausea or vomiting.  Seizure. A stroke may be happening even if only one "BE FAST" symptoms is present. These symptoms may represent a serious problem that is an emergency. Do not wait to see if the symptoms will go away. Get medical help right away. Call your local emergency services (911 in the [place] Do not drive  yourself to the hospital. Summary  A stroke is a medical emergency and should be treated right away--every second counts.  "BE FAST" is an easy way to remember the main warning signs of a stroke.  Call local emergency services right away if you or someone else has any stroke symptoms, even if the symptoms go away.  Make note of what time the first symptoms appeared. Emergency responders or emergency room staff will need to know this information.  Do not wait to see if  symptoms will go away. Call 911 even if only one of the "BE FAST" symptoms appears. This information is not intended to replace advice given to you by your health care provider. Make sure you discuss any questions you have with your health care provider. Document Revised: 10/28/2017 Document Reviewed: 03/04/2017 Elsevier Patient Education  [place].      Stroke Prevention Some medical conditions and behaviors are associated with a higher chance of having a stroke. You can help prevent a stroke by making nutrition, lifestyle, and other changes, including managing any medical conditions you may have. What nutrition changes can be made?   Eat healthy foods. You can do this by: ? Choosing foods high in fiber, such as fresh fruits and vegetables and whole grains. ? Eating at least 5 or more servings of fruits and vegetables a day. Try to fill half of your plate at each meal with fruits and vegetables. ? Choosing lean protein foods, such as lean cuts of meat, poultry without skin, fish, tofu, beans, and nuts. ? Eating low-fat dairy products. ? Avoiding foods that are high in salt (sodium). This can help lower blood pressure. ? Avoiding foods that have saturated fat, trans fat, and cholesterol. This can help prevent high cholesterol. ? Avoiding processed and premade foods.  Follow your health care provider's specific guidelines for losing weight, controlling high blood pressure (hypertension), lowering high cholesterol, and managing diabetes. These may include: ? Reducing your daily calorie intake. ? Limiting your daily sodium intake to 1,500 milligrams (mg). ? Using only healthy fats for cooking, such as olive oil, canola oil, or sunflower oil. ? Counting your daily carbohydrate intake. What lifestyle changes can be made?  Maintain a healthy weight. Talk to your health care provider about your ideal weight.  Get at least 30 minutes of moderate physical activity at least 5 days a  week. Moderate activity includes brisk walking, biking, and swimming.  Do not use any products that contain nicotine or tobacco, such as cigarettes and e-cigarettes. If you need help quitting, ask your health care provider. It may also be helpful to avoid exposure to secondhand smoke.  Limit alcohol intake to no more than 1 drink a day for nonpregnant women and 2 drinks a day for men. One drink equals 12 oz of beer, 5 oz of wine, or 1 oz of hard liquor.  Stop any illegal drug use.  Avoid taking birth control pills. Talk to your health care provider about the risks of taking birth control pills if: ? You are over 64 years old. ? You smoke. ? You get migraines. ? You have ever had a blood clot. What other changes can be made?  Manage your cholesterol levels. ? Eating a healthy diet is important for preventing high cholesterol. If cholesterol cannot be managed through diet alone, you may also need to take medicines. ? Take any prescribed medicines to control your cholesterol as told by your health care provider.  Manage your  diabetes. ? Eating a healthy diet and exercising regularly are important parts of managing your blood sugar. If your blood sugar cannot be managed through diet and exercise, you may need to take medicines. ? Take any prescribed medicines to control your diabetes as told by your health care provider.  Control your hypertension. ? To reduce your risk of stroke, try to keep your blood pressure below 130/80. ? Eating a healthy diet and exercising regularly are an important part of controlling your blood pressure. If your blood pressure cannot be managed through diet and exercise, you may need to take medicines. ? Take any prescribed medicines to control hypertension as told by your health care provider. ? Ask your health care provider if you should monitor your blood pressure at home. ? Have your blood pressure checked every year, even if your blood pressure is normal.  Blood pressure increases with age and some medical conditions.  Get evaluated for sleep disorders (sleep apnea). Talk to your health care provider about getting a sleep evaluation if you snore a lot or have excessive sleepiness.  Take over-the-counter and prescription medicines only as told by your health care provider. Aspirin or blood thinners (antiplatelets or anticoagulants) may be recommended to reduce your risk of forming blood clots that can lead to stroke.  Make sure that any other medical conditions you have, such as atrial fibrillation or atherosclerosis, are managed. What are the warning signs of a stroke? The warning signs of a stroke can be easily remembered as BEFAST.  B is for balance. Signs include: ? Dizziness. ? Loss of balance or coordination. ? Sudden trouble walking.  E is for eyes. Signs include: ? A sudden change in vision. ? Trouble seeing.  F is for face. Signs include: ? Sudden weakness or numbness of the face. ? The face or eyelid drooping to one side.  A is for arms. Signs include: ? Sudden weakness or numbness of the arm, usually on one side of the body.  S is for speech. Signs include: ? Trouble speaking (aphasia). ? Trouble understanding.  T is for time. ? These symptoms may represent a serious problem that is an emergency. Do not wait to see if the symptoms will go away. Get medical help right away. Call your local emergency services (911 in the [place] Do not drive yourself to the hospital.  Other signs of stroke may include: ? A sudden, severe headache with no known cause. ? Nausea or vomiting. ? Seizure. Where to find more information For more information, visit:  American Stroke Association: www.strokeassociation.org  National Stroke Association: www.stroke.org Summary  You can prevent a stroke by eating healthy, exercising, not smoking, limiting alcohol intake, and managing any medical conditions you may have.  Do not use any products  that contain nicotine or tobacco, such as cigarettes and e-cigarettes. If you need help quitting, ask your health care provider. It may also be helpful to avoid exposure to secondhand smoke.  Remember BEFAST for warning signs of stroke. Get help right away if you or a loved one has any of these signs. This information is not intended to replace advice given to you by your health care provider. Make sure you discuss any questions you have with your health care provider. Document Revised: 10/28/2017 Document Reviewed: 12/21/2016 Elsevier Patient Education  2020 [employer].

## 2020-09-24 NOTE — Progress Notes (Signed)
[facility] [address] [place]. [place] [address] [phone]       STROKE FOLLOW UP NOTE  Ms. Zylpha JOURNE HALLMARK Date of Birth:  July 01, 1956 Medical Record Number:  [id number]   Reason for Referral: stroke follow up    CHIEF COMPLAINT:  F/u stroke  HPI:  Today, 09/24/2020, Ms. Olkowski returns for stroke follow-up.  Reports residual deficits of mild right hand numbness but this does not interfere with activity or functioning.  Denies weakness.  Denies new or worsening stroke/TIA symptoms.  Remains on aspirin 81 mg daily and Crestor 40 mg daily for secondary stroke prevention without side effects.  Blood pressure today 133/87.  Recently establish care with endocrinology for DM management with recent A1c 6.3.  No concerns at this time.    History provided for reference purposes only Update 03/24/2020 JM: Ms. Wurster returns for stroke follow-up.  Residual deficits of mild right thumb and pointer finger numbness sensation with ongoing improvement.  Denies residual balance difficulties. She does ambulate approx 3.4 miles per day without difficulty.  Continues on aspirin 81 mg daily and Crestor 40 mg daily for secondary stroke prevention.  Blood pressure today 152/92. Has not taken antihypertensives this morning. Monitors at home and typically stable SBP 110-130s.  Does not monitoring glucose levels at home.  Does have follow up visit this week with PCP to recheck lab work and plans on establishing care with endocrinologist in July. No concerns at this time.  Initial visit 12/25/2019 JM: Ms. Llorente is a 64 year old female who is being seen today for hospital follow-up.  Residual stroke deficits of mild right pointer finger numbness and mild imbalance but overall greatly recovering.  She continues to work with PT for ongoing benefit of high-level balance, gait and right hamstring strengthening.  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  She has  completed 3 weeks DAPT and continues on aspirin alone without bleeding or bruising.  Continues on Crestor 40 mg daily without myalgias.  Lipid panel obtained yesterday with PCP which showed LDL 59 and triglycerides decreased from 2 64 to 1 50.  Blood pressure today satisfactory at 138/84.  She continues on Metformin 500 mg daily for DM management and ongoing follow-up with PCP.  She does have a follow-up visit with PCP in 3 months and plans on obtaining A1c level at that time.  She does report initially feeling fatigued but this has since improved.  She will occasionally have insomnia but she feels as though this is typically with increased stress or anxiety.  Denies snoring or witnessed apneic events.  No further concerns at this time.  Stroke admission 11/13/2019: Ms. Damoni LUDMILLA MCGILLIS is a 64 y.o. female with history of CAD with NSTEMI s/p stent 01/2008, HTN, HLD, breast Cancer in 2016 now on anastrozole admitted on 11/13/2019 for right sided weakness and right hand numbness.  Evaluated by Dr. Erlinda Hong and stroke team with stroke work-up revealing left AchA territory small lacunar infarct (left PLIC and CR infarct) as evidenced on MRI secondary to small vessel disease source.  CTA head/neck showed right M1, left M2 and M3 stenosis.  2D echo showed an EF of 60 to 65%.  Recommended DAPT for 3 weeks and aspirin alone.  New diagnosis of uncontrolled DM with A1c 9.7 and recommended close PCP follow-up for better DM control.  HTN stable on the high side with long-term BP goal normotensive range.  LDL 202 and initiated Crestor 40  mg daily.  Other stroke risk factors include EtOH use, obesity and CAD status post stenting.  Other active problems include breast cancer 2016 status post sx and rx, now on anastrozole.  No prior history of stroke.  Evaluated by therapies and recommended discharge home with outpatient PT/OT with residual right hand dexterity difficulty.  Stroke:  left AchA territory small lacunar infarct, likely  secondary to small vessel disease source  Resultant right hand dexterity difficulty  MRI  Left PLIC and CR infarct  CTA head andn neck showed right M1, left M2 and M3 stenosis  2D Echo EF 60-65%  LDL 202  HgbA1c 9.7  SCDs for VTE prophylaxis  No antithrombotic prior to admission, now on aspirin 81 mg daily and clopidogrel 75 mg daily. Continue DAPT for 3 weeks and then ASA alsone   Patient counseled to be compliant with her antithrombotic medications  Ongoing aggressive stroke risk factor management  Therapy recommendations:  outpt PT/OT  Disposition:  home      ROS:   14 system review of systems performed and negative with exception of numbness  PMH:  Past Medical History:  Diagnosis Date  . Breast cancer ([place]) 07/09/15   left breast,  lower-inner quadrant   . Coronary artery disease    cardiologist-  dr Donnetta Hutching [facility] regional physicians, [facility] cardiology in [facility])---  hx PCI w/ stents to OM and LAD 02-08-2008  . Hyperlipidemia   . Hypertension   . PMB (postmenopausal bleeding)   . S/P coronary artery stent placement     PSH:  Past Surgical History:  Procedure Laterality Date  . CESAREAN SECTION  10-09-1996  . CORONARY ANGIOPLASTY WITH STENT PLACEMENT  02-08-2008   dr Donnetta Hutching   PCI and stent to OM and LAD  . DILATATION & CURRETTAGE/HYSTEROSCOPY WITH RESECTOCOPE N/A 08/15/2015   Procedure: DILATATION & CURETTAGE/HYSTEROSCOPY ;  Surgeon: Eldred Manges, MD;  Location: [facility];  Service: Gynecology;  Laterality: N/A;  . DILATION AND CURETTAGE OF UTERUS  1994   blighted ovum    Social History:  Social History   Socioeconomic History  . Marital status: Single    Spouse name: Not on file  . Number of children: Not on file  . Years of education: Not on file  . Highest education level: Not on file  Occupational History  . Not on file  Tobacco Use  . Smoking status: Never Smoker  . Smokeless tobacco: Never Used  Vaping Use  .  Vaping Use: Unknown  Substance and Sexual Activity  . Alcohol use: Yes    Alcohol/week: 0.0 standard drinks    Comment: occasionally  . Drug use: No  . Sexual activity: Not on file  Other Topics Concern  . Not on file  Social History Narrative  . Not on file   Social Determinants of Health   Financial Resource Strain:   . Difficulty of Paying Living Expenses: Not on file  Food Insecurity:   . Worried About Charity fundraiser in the Last Year: Not on file  . Ran Out of Food in the Last Year: Not on file  Transportation Needs:   . Lack of Transportation (Medical): Not on file  . Lack of Transportation (Non-Medical): Not on file  Physical Activity:   . Days of Exercise per Week: Not on file  . Minutes of Exercise per Session: Not on file  Stress:   . Feeling of Stress : Not on file  Social Connections:   .  Frequency of Communication with Friends and Family: Not on file  . Frequency of Social Gatherings with Friends and Family: Not on file  . Attends Religious Services: Not on file  . Active Member of Clubs or Organizations: Not on file  . Attends Archivist Meetings: Not on file  . Marital Status: Not on file  Intimate Partner Violence:   . Fear of Current or Ex-Partner: Not on file  . Emotionally Abused: Not on file  . Physically Abused: Not on file  . Sexually Abused: Not on file    Family History:  Family History  Problem Relation Age of Onset  . Heart disease Mother   . Stroke Mother   . Colon cancer Mother 68  . Heart disease Father     Medications:   Current Outpatient Medications on File Prior to Visit  Medication Sig Dispense Refill  . anastrozole (ARIMIDEX) 1 MG tablet Take 1 tablet (1 mg total) by mouth daily. 90 tablet 4  . Ascorbic Acid (VITAMIN C GUMMIE PO) Take 2,000 mg by mouth daily.    Marland Kitchen aspirin 81 MG tablet Take 81 mg by mouth daily.    Marland Kitchen atenolol (TENORMIN) 100 MG tablet Take 50 mg by mouth at bedtime.   5  . Cholecalciferol (VITAMIN  D3) 2000 UNITS TABS Take 1 tablet by mouth daily.    . metFORMIN (GLUCOPHAGE) 500 MG tablet Take 1 tablet (500 mg total) by mouth daily with breakfast. 90 tablet 3  . nitroGLYCERIN (NITROSTAT) 0.4 MG SL tablet Place 0.4 mg under the tongue every 5 (five) minutes as needed for chest pain.     . rosuvastatin (CRESTOR) 40 MG tablet Take 1 tablet (40 mg total) by mouth daily. 90 tablet 3  . vitamin B-12 (CYANOCOBALAMIN) 500 MCG tablet Take 1 tablet (500 mcg total) by mouth daily.     No current facility-administered medications on file prior to visit.    Allergies:   Allergies  Allergen Reactions  . Capsule [Gelatin] Hives    Any medication that is in gel capsule form  . Capsaicin Other (See Comments)    Unknown rxn     Vitals  Vitals:   09/24/20 0750  BP: 133/87  Pulse: (!) 59  Weight: 183 lb (83 kg)  Height: 5\' 8"  (1.727 m)   Body mass index is 27.83 kg/m.  Physical exam  General: well developed, well nourished, pleasant middle-age African-American female, seated, in no evident distress Head: head normocephalic and atraumatic.   Neck: supple with no carotid or supraclavicular bruits Cardiovascular: regular rate and rhythm, no murmurs Musculoskeletal: no deformity Skin:  no rash/petichiae Vascular:  Normal pulses all extremities   Neurologic Exam Mental Status: Awake and fully alert.   Fluent speech and language.  Oriented to place and time. Recent and remote memory intact. Attention span, concentration and fund of knowledge appropriate. Mood and affect appropriate.  Cranial Nerves: Pupils equal, briskly reactive to light. Extraocular movements full without nystagmus. Visual fields full to confrontation. Hearing intact. Facial sensation intact. Face, tongue, palate moves normally and symmetrically.  Motor: Normal bulk and tone. Normal strength in all tested extremity muscles. Sensory.: intact to touch , pinprick , position and vibratory sensation except decreased pinprick  sensation right fingers distally.  Coordination: Rapid alternating movements normal in all extremities. Finger-to-nose and heel-to-shin performed accurately bilaterally. Gait and Station: Arises from chair without difficulty. Stance is normal. Gait demonstrates normal stride length and balance.  Able to perform tandem walk and  walking on toes and on heels without difficulty Reflexes: 1+ and symmetric. Toes downgoing.         ASSESSMENT/PLAN: Laurabeth T Blanchard is a 65 y.o. year old female presented with right-sided weakness and right hand numbness on 11/12/2019 with stroke work-up revealing left AchA territory small lacunar infarct likely secondary to small vessel disease source. Vascular risk factors include CAD with NSTEMI s/p stenting, HTN, HLD, uncontrolled DM, intracranial stenosis, obesity and EtOH use.      1. Left AchA territory infarct:  a. Residual deficits right fingertip numbness -stable without worsening b. Continue aspirin 81 mg daily  and Crestor 40 mg daily for secondary stroke prevention. c. Discussed secondary stroke prevention measures and importance of close PCP follow-up for aggressive stroke risk factor management and monitoring 2. HTN: BP goal<130/90.  Stable. On atenolol per PCP 3. HLD: LDL goal<70.  Recent LDL 59.  On Crestor 40 mg daily per PCP 4. DMII: A1c goal<7.  Recent A1c 6.3.  On Metformin per endocrinology 5. CAD s/p stent: Continue to follow with cardiology as scheduled   Overall stable from stroke standpoint and recommend follow-up on an as-needed basis  CC:  [facility] provider: Dr. Lujean Rave, Real Cons, MD    I spent 30 minutes of face-to-face and non-face-to-face time with patient.  This included previsit chart review, lab review, study review, order entry, electronic health record documentation, patient education regarding prior stroke, residual deficits, secondary stroke prevention measures and importance of managing stroke risk factors, stroke warning  signs and answered all questions to patient satisfaction   Frann Rider, [facility]  [facility] [address] [place], [place] [address]  Phone [phone] Fax [phone] Note: This document was prepared with digital dictation and possible smart phrase technology. Any transcriptional errors that result from this process are unintentional.

## 2020-09-24 NOTE — Progress Notes (Signed)
I agree with the above plan 

## 2020-09-30 NOTE — Progress Notes (Signed)
Cardiology Office Note:    Date:  09/18/2020   ID:  Audrey Fischer, DOB 24-Jun-1956, MRN [id number]  PCP:  Hoyt Koch, MD  Cardiologist:  Elouise Munroe, MD  Electrophysiologist:  None   Referring MD: Hoyt Koch, *   Chief Complaint/Reason for Referral: NSTEMI, stroke  History of Present Illness:    Audrey Fischer is a 64 y.o. female with a history of NSTEMI in March 2009 with LAD and OM stents, hypertension, hyperlipidemia, and history of breast cancer on anastrozole.  She also has a history of stroke in Dec 2020. She suffered a stroke with symptoms starting on December 13 presenting with right hand numbness and tingling as well as gait disturbance.  MRI showed acute infarct in the posterior external capsule on the left.  She was recommended to take Plavix for 3 weeks in addition to indefinite aspirin therapy 81 mg daily.  Crestor was increased to 40 mg daily.  She was diagnosed with diabetes with an A1c of 9.7.   Our last visit was 03/26/20. We discussed possible SGLT2I therapy as well as transition from atenolol to carvedilol, she defers and would like to stay on current therapy.   Continues to do well and improve. HbA1C is now 6.7%. Lipids have improved with LDL 59 (down from >200). She feels she has regained nearly all function after stroke.   The patient denies chest pain, chest pressure, dyspnea at rest or with exertion, palpitations, PND, orthopnea, or leg swelling. Denies cough, fever, chills. Denies nausea, vomiting. Denies syncope or presyncope. Denies dizziness or lightheadedness.  Past Medical History:  Diagnosis Date  . Breast cancer ([place]) 07/09/15   left breast,  lower-inner quadrant   . Coronary artery disease    cardiologist-  dr Donnetta Hutching [facility] regional physicians, [facility] cardiology in [facility])---  hx PCI w/ stents to OM and LAD 02-08-2008  . Hyperlipidemia   . Hypertension   . PMB (postmenopausal bleeding)   . S/P coronary artery stent placement      Past Surgical History:  Procedure Laterality Date  . CESAREAN SECTION  10-09-1996  . CORONARY ANGIOPLASTY WITH STENT PLACEMENT  02-08-2008   dr Donnetta Hutching   PCI and stent to OM and LAD  . DILATATION & CURRETTAGE/HYSTEROSCOPY WITH RESECTOCOPE N/A 08/15/2015   Procedure: DILATATION & CURETTAGE/HYSTEROSCOPY ;  Surgeon: Eldred Manges, MD;  Location: [facility];  Service: Gynecology;  Laterality: N/A;  . DILATION AND CURETTAGE OF UTERUS  1994   blighted ovum    Current Medications: Current Meds  Medication Sig  . anastrozole (ARIMIDEX) 1 MG tablet Take 1 tablet (1 mg total) by mouth daily.  . Ascorbic Acid (VITAMIN C GUMMIE PO) Take 2,000 mg by mouth daily.  Marland Kitchen aspirin 81 MG tablet Take 81 mg by mouth daily.  Marland Kitchen atenolol (TENORMIN) 100 MG tablet Take 50 mg by mouth at bedtime.   . Cholecalciferol (VITAMIN D3) 2000 UNITS TABS Take 1 tablet by mouth daily.  . metFORMIN (GLUCOPHAGE) 500 MG tablet Take 1 tablet (500 mg total) by mouth daily with breakfast.  . nitroGLYCERIN (NITROSTAT) 0.4 MG SL tablet Place 0.4 mg under the tongue every 5 (five) minutes as needed for chest pain.   . rosuvastatin (CRESTOR) 40 MG tablet Take 1 tablet (40 mg total) by mouth daily.  . vitamin B-12 (CYANOCOBALAMIN) 500 MCG tablet Take 1 tablet (500 mcg total) by mouth daily.     Allergies:   Capsule [gelatin] and Capsaicin  Social History   Tobacco Use  . Smoking status: Never Smoker  . Smokeless tobacco: Never Used  Vaping Use  . Vaping Use: Unknown  Substance Use Topics  . Alcohol use: Yes    Alcohol/week: 0.0 standard drinks    Comment: occasionally  . Drug use: No     Family History: The patient's family history includes Colon cancer (age of onset: 96) in her mother; Heart disease in her father and mother; Stroke in her mother.  ROS:   Please see the history of present illness.    All other systems reviewed and are negative.  EKGs/Labs/Other Studies Reviewed:    The  following studies were reviewed today:  EKG:  Sinus bradycardia, LVH  Recent Labs: 12/24/2019: ALT 24; Hemoglobin 13.4; Platelets 262.0 03/26/2020: BUN 25; Creatinine, Ser 1.65; Potassium 4.2; Sodium 138  Recent Lipid Panel    Component Value Date/Time   CHOL 127 12/24/2019 1603   TRIG 150.0 (H) 12/24/2019 1603   HDL 37.80 (L) 12/24/2019 1603   CHOLHDL 3 12/24/2019 1603   VLDL 30.0 12/24/2019 1603   LDLCALC 59 12/24/2019 1603    Physical Exam:    VS:  BP (!) 144/90   Pulse (!) 56   Ht 5' 8.5" (1.74 m)   Wt 184 lb 12.8 oz (83.8 kg)   BMI 27.69 kg/m     Wt Readings from Last 5 Encounters:  09/24/20 183 lb (83 kg)  09/18/20 184 lb 12.8 oz (83.8 kg)  03/26/20 190 lb (86.2 kg)  03/26/20 190 lb (86.2 kg)  03/24/20 190 lb (86.2 kg)    Constitutional: No acute distress Eyes: sclera non-icteric, normal conjunctiva and lids ENMT: normal dentition, moist mucous membranes Cardiovascular: regular rhythm, normal rate, no murmurs. S1 and S2 normal. Radial pulses normal bilaterally. No jugular venous distention.  Respiratory: clear to auscultation bilaterally GI : normal bowel sounds, soft and nontender. No distention.   MSK: extremities warm, well perfused. No edema.  NEURO: grossly nonfocal exam, moves all extremities. PSYCH: alert and oriented x 3, normal mood and affect.   ASSESSMENT:    1. Coronary artery disease involving native coronary artery of native heart without angina pectoris   2. Non-ST elevation (NSTEMI) myocardial infarction ([place])   3. History of percutaneous coronary intervention   4. Essential hypertension   5. Hx of completed stroke   6. Hyperlipidemia associated with type 2 diabetes mellitus ([place])    PLAN:    Coronary artery disease involving native coronary artery of native heart without angina pectoris Non-ST elevation (NSTEMI) myocardial infarction ([place]) - Plan: EKG 12-Lead History of percutaneous coronary intervention - ASA 81 mg indefinitely -  continue BB, crestor - no chest pain or DOE.  Hypertension, unspecified type - mildly elevated but has been overall normal. Observe and can titrate meds as needed.  HLD - continue crestor, lipids at goal.   Total time of encounter: 30 minutes total time of encounter, including 20 minutes spent in face-to-face patient care on the date of this encounter. This time includes coordination of care and counseling regarding above mentioned problem list. Remainder of non-face-to-face time involved reviewing chart documents/testing relevant to the patient encounter and documentation in the medical record. I have independently reviewed documentation from referring provider.   Cherlynn Kaiser, MD [facility]  [facility]    Medication Adjustments/Labs and Tests Ordered: Current medicines are reviewed at length with the patient today.  Concerns regarding medicines are outlined above.   Orders Placed This Encounter  Procedures  . EKG 12-Lead    No orders of the defined types were placed in this encounter.   Patient Instructions  Medication Instructions:  No Changes In Medications at this time.  *If you need a refill on your cardiac medications before your next appointment, please call your pharmacy*  Lab Work: None Ordered At This Time.  If you have labs (blood work) drawn today and your tests are completely normal, you will receive your results only by: Marland Kitchen MyChart Message (if you have MyChart) OR . A paper copy in the mail If you have any lab test that is abnormal or we need to change your treatment, we will call you to review the results.  Testing/Procedures: None Ordered At This Time.   Follow-Up: At [facility], you and your health needs are our priority.  As part of our continuing mission to provide you with exceptional heart care, we have created designated Provider Care Teams.  These Care Teams include your primary Cardiologist (physician) and Advanced Practice Providers  (APPs -  Physician Assistants and Nurse Practitioners) who all work together to provide you with the care you need, when you need it.  We recommend signing up for the patient portal called "MyChart".  Sign up information is provided on this After Visit Summary.  MyChart is used to connect with patients for Virtual Visits (Telemedicine).  Patients are able to view lab/test results, encounter notes, upcoming appointments, etc.  Non-urgent messages can be sent to your provider as well.   To learn more about what you can do with MyChart, go to [email or web address].    Your next appointment:   6 month(s)  The format for your next appointment:   In Person  Provider:   Cherlynn Kaiser, MD

## 2020-10-15 ENCOUNTER — Telehealth: Payer: Self-pay | Admitting: Internal Medicine

## 2020-10-15 NOTE — Telephone Encounter (Signed)
*  STAT* If patient is at the pharmacy, call can be transferred to refill team.   1. Which medications need to be refilled? (please list name of each medication and dose if known) atenolol (TENORMIN) 100 MG tablet  2. Which pharmacy/location (including street and city if local pharmacy) is medication to be sent to? [id number] - [place], [place] - [address]  3. Do they need a 30 day or 90 day supply? 90 day   Patient is out of medication

## 2020-10-15 NOTE — Telephone Encounter (Signed)
Pt aware Dr. Margaretann Loveless has no filled this medication in the past. She will contact her pharmacy and/or her PCP office for refill request.

## 2020-10-16 ENCOUNTER — Telehealth: Payer: Self-pay | Admitting: Internal Medicine

## 2020-10-16 NOTE — Telephone Encounter (Signed)
1.Medication Requested: atenolol (TENORMIN) 100 MG tablet 2. Pharmacy (Name, Street, Emporium):  [facility]. [id number] [place], [address] Phone:  [phone]  Fax:  [phone]      On Med List:yes  4. Last Visit with PCP:4.28.21  5. Next visit date with PCP:n/a   Agent: Please be advised that RX refills may take up to 3 business days. We ask that you follow-up with your pharmacy.

## 2020-10-17 ENCOUNTER — Other Ambulatory Visit: Payer: Self-pay

## 2020-10-17 MED ORDER — ATENOLOL 100 MG PO TABS: 50.0000 mg | ORAL_TABLET | Freq: Every day | ORAL | 3 refills | Status: DC

## 2020-11-24 ENCOUNTER — Other Ambulatory Visit: Payer: Self-pay | Admitting: Internal Medicine

## 2021-01-10 ENCOUNTER — Other Ambulatory Visit: Payer: Self-pay | Admitting: Internal Medicine

## 2021-01-22 ENCOUNTER — Other Ambulatory Visit: Payer: Self-pay

## 2021-01-22 ENCOUNTER — Ambulatory Visit (AMBULATORY_SURGERY_CENTER): Payer: Self-pay | Admitting: *Deleted

## 2021-01-22 VITALS — Ht 68.0 in | Wt 185.0 lb

## 2021-01-22 DIAGNOSIS — Z800 Family history of malignant neoplasm of digestive organs: Secondary | ICD-10-CM

## 2021-01-22 MED ORDER — PLENVU 140 G PO SOLR: 1.0000 | ORAL | 0 refills | Status: DC

## 2021-01-22 NOTE — Progress Notes (Signed)
No egg or soy allergy known to patient  No issues with past sedation with any surgeries or procedures No intubation problems in the past  No FH of Malignant Hyperthermia No diet pills per patient No home 02 use per patient  No blood thinners per patient  Pt denies issues with constipation  No A fib or A flutter  EMMI video to pt or via [place] 19 guidelines implemented in PV today with Pt and RN  Pt is fully vaccinated  for Covid   Plenvu  Coupon given to pt in PV today , Code to Pharmacy and  NO PA's for preps discussed with pt In PV today  Discussed with pt there will be an out-of-pocket cost for prep and that varies from $0 to 70 dollars   Due to the COVID-19 pandemic we are asking patients to follow certain guidelines.  Pt aware of COVID protocols and [facility] guidelines

## 2021-01-23 ENCOUNTER — Encounter: Payer: Self-pay | Admitting: Internal Medicine

## 2021-02-05 ENCOUNTER — Other Ambulatory Visit: Payer: Self-pay

## 2021-02-05 ENCOUNTER — Ambulatory Visit (AMBULATORY_SURGERY_CENTER): Payer: BC Managed Care – PPO | Admitting: Internal Medicine

## 2021-02-05 ENCOUNTER — Encounter: Payer: Self-pay | Admitting: Internal Medicine

## 2021-02-05 VITALS — BP 145/59 | HR 58 | Temp 96.2°F | Resp 11 | Ht 68.0 in | Wt 185.0 lb

## 2021-02-05 DIAGNOSIS — Z800 Family history of malignant neoplasm of digestive organs: Secondary | ICD-10-CM | POA: Diagnosis present

## 2021-02-05 DIAGNOSIS — Z1211 Encounter for screening for malignant neoplasm of colon: Secondary | ICD-10-CM

## 2021-02-05 MED ORDER — SODIUM CHLORIDE 0.9 % IV SOLN: 500.0000 mL | Freq: Once | INTRAVENOUS | Status: DC

## 2021-02-05 NOTE — Progress Notes (Signed)
Medical history reviewed with no changes noted. VS assessed by C.W 

## 2021-02-05 NOTE — Op Note (Signed)
[facility] Patient Name: Audrey Fischer Procedure Date: 02/05/2021 9:37 AM MRN: [id number] Endoscopist: Jerene Bears , MD Age: 65 Referring MD:  Date of Birth: Feb 27, 1956 Gender: Female Account #: [id number] Procedure:                Colonoscopy Indications:              Screening patient at increased risk: Family history                            of 1st-degree relative with colorectal cancer at                            age 48 years (or older), Last colonoscopy: August                            2016 Medicines:                Monitored Anesthesia Care Procedure:                Pre-Anesthesia Assessment:                           - Prior to the procedure, a History and Physical                            was performed, and patient medications and                            allergies were reviewed. The patient's tolerance of                            previous anesthesia was also reviewed. The risks                            and benefits of the procedure and the sedation                            options and risks were discussed with the patient.                            All questions were answered, and informed consent                            was obtained. Prior Anticoagulants: The patient has                            taken no previous anticoagulant or antiplatelet                            agents. ASA Grade Assessment: II - A patient with                            mild systemic disease. After reviewing the risks  and benefits, the patient was deemed in                            satisfactory condition to undergo the procedure.                           After obtaining informed consent, the colonoscope                            was passed under direct vision. Throughout the                            procedure, the patient's blood pressure, pulse, and                            oxygen saturations were monitored continuously. The                             Olympus PCF-H190DL ([id number]) Colonoscope was                            introduced through the anus and advanced to the                            ileocecal valve. The colonoscopy was performed                            without difficulty. The patient tolerated the                            procedure well. The quality of the bowel                            preparation was good. The ileocecal valve,                            appendiceal orifice, and rectum were photographed. Scope In: 9:42:53 AM Scope Out: 10:04:10 AM Scope Withdrawal Time: 0 hours 10 minutes 15 seconds  Total Procedure Duration: 0 hours 21 minutes 17 seconds  Findings:                 The digital rectal exam was normal.                           The colon (entire examined portion) appeared normal.                           Internal hemorrhoids were found during                            retroflexion. The hemorrhoids were small. Complications:            No immediate complications. Estimated Blood Loss:     Estimated blood loss: none. Impression:               - The entire examined colon  is normal.                           - Small internal hemorrhoids.                           - No specimens collected. Recommendation:           - Patient has a contact number available for                            emergencies. The signs and symptoms of potential                            delayed complications were discussed with the                            patient. Return to normal activities tomorrow.                            Written discharge instructions were provided to the                            patient.                           - Resume previous diet.                           - Continue present medications.                           - Repeat colonoscopy in 5 years for screening                            purposes. Jerene Bears, MD 02/05/2021 10:06:06 AM This report has been signed  electronically.

## 2021-02-05 NOTE — Progress Notes (Signed)
Report to PACU, RN, vss, BBS= Clear.  

## 2021-02-05 NOTE — Patient Instructions (Signed)
YOU HAD AN ENDOSCOPIC PROCEDURE TODAY AT THE [facility]:   Refer to the procedure report that was given to you for any specific questions about what was found during the examination.  If the procedure report does not answer your questions, please call your gastroenterologist to clarify.  If you requested that your care partner not be given the details of your procedure findings, then the procedure report has been included in a sealed envelope for you to review at your convenience later.  YOU SHOULD EXPECT: Some feelings of bloating in the abdomen. Passage of more gas than usual.  Walking can help get rid of the air that was put into your GI tract during the procedure and reduce the bloating. If you had a lower endoscopy (such as a colonoscopy or flexible sigmoidoscopy) you may notice spotting of blood in your stool or on the toilet paper. If you underwent a bowel prep for your procedure, you may not have a normal bowel movement for a few days.  Please Note:  You might notice some irritation and congestion in your nose or some drainage.  This is from the oxygen used during your procedure.  There is no need for concern and it should clear up in a day or so.  SYMPTOMS TO REPORT IMMEDIATELY:   Following lower endoscopy (colonoscopy or flexible sigmoidoscopy):  Excessive amounts of blood in the stool  Significant tenderness or worsening of abdominal pains  Swelling of the abdomen that is new, acute  Fever of 100F or higher   Following upper endoscopy (EGD)  Vomiting of blood or coffee ground material  New chest pain or pain under the shoulder blades  Painful or persistently difficult swallowing  New shortness of breath  Fever of 100F or higher  Black, tarry-looking stools  For urgent or emergent issues, a gastroenterologist can be reached at any hour by calling [phone]. Do not use MyChart messaging for urgent concerns.    DIET:  We do recommend a small meal at first, but  then you may proceed to your regular diet.  Drink plenty of fluids but you should avoid alcoholic beverages for 24 hours.  ACTIVITY:  You should plan to take it easy for the rest of today and you should NOT DRIVE or use heavy machinery until tomorrow (because of the sedation medicines used during the test).    FOLLOW UP: Our staff will call the number listed on your records 48-72 hours following your procedure to check on you and address any questions or concerns that you may have regarding the information given to you following your procedure. If we do not reach you, we will leave a message.  We will attempt to reach you two times.  During this call, we will ask if you have developed any symptoms of COVID 19. If you develop any symptoms (ie: fever, flu-like symptoms, shortness of breath, cough etc.) before then, please call [phone].  If you test positive for Covid 19 in the 2 weeks post procedure, please call and report this information to us.    If any biopsies were taken you will be contacted by phone or by letter within the next 1-3 weeks.  Please call us at [phone] if you have not heard about the biopsies in 3 weeks.    SIGNATURES/CONFIDENTIALITY: You and/or your care partner have signed paperwork which will be entered into your electronic medical record.  These signatures attest to the fact that that the information above on   your After Visit Summary has been reviewed and is understood.  Full responsibility of the confidentiality of this discharge information lies with you and/or your care-partner. 

## 2021-02-10 ENCOUNTER — Telehealth: Payer: Self-pay

## 2021-02-10 NOTE — Telephone Encounter (Signed)
  Follow up Call-  Call back number 02/05/2021  Post procedure Call Back phone  # [phone]  Permission to leave phone message Yes  Some recent data might be hidden     Patient questions:  Do you have a fever, pain , or abdominal swelling? No. Pain Score  0 *  Have you tolerated food without any problems? Yes.    Have you been able to return to your normal activities? Yes.    Do you have any questions about your discharge instructions: Diet   No. Medications  No. Follow up visit  No.  Do you have questions or concerns about your Care? No.  Actions: * If pain score is 4 or above: No action needed, pain <4.  1. Have you developed a fever since your procedure? no  2.   Have you had an respiratory symptoms (SOB or cough) since your procedure? no  3.   Have you tested positive for COVID 19 since your procedure no  4.   Have you had any family members/close contacts diagnosed with the COVID 19 since your procedure?  no   If yes to any of these questions please route to Joylene John, RN and Joella Prince, RN

## 2021-02-25 ENCOUNTER — Other Ambulatory Visit: Payer: Self-pay | Admitting: Internal Medicine

## 2021-02-25 NOTE — Telephone Encounter (Signed)
I have called pt and informed her that she has not been seen since 02/2020 and will need an appointment to get additional refills of her mediation. She stated that she is not out of the medication yet so [facility] must have sent in the med refill request.   I stated understanding and scheduled her for a f/u appointment for tomorrow at 3:40 pm. Pt also stated that she will have forms to completed and inquired about the cost. I have informed her to ask upfront that questions since I am not sure of a cost she stated understanding and will bring everything she needs at that appointment visit.   Do you want to refill the medication or wait until she is seen? Please advise on the refill.

## 2021-02-26 ENCOUNTER — Ambulatory Visit: Payer: BC Managed Care – PPO | Admitting: Internal Medicine

## 2021-03-16 ENCOUNTER — Encounter: Payer: Self-pay | Admitting: Internal Medicine

## 2021-03-16 ENCOUNTER — Other Ambulatory Visit: Payer: Self-pay

## 2021-03-16 ENCOUNTER — Ambulatory Visit: Payer: BC Managed Care – PPO | Admitting: Internal Medicine

## 2021-03-16 VITALS — BP 130/72 | HR 63 | Temp 98.4°F | Resp 18 | Ht 68.0 in | Wt 180.2 lb

## 2021-03-16 DIAGNOSIS — Z0000 Encounter for general adult medical examination without abnormal findings: Secondary | ICD-10-CM | POA: Diagnosis not present

## 2021-03-16 DIAGNOSIS — E118 Type 2 diabetes mellitus with unspecified complications: Secondary | ICD-10-CM | POA: Diagnosis not present

## 2021-03-16 DIAGNOSIS — Z8673 Personal history of transient ischemic attack (TIA), and cerebral infarction without residual deficits: Secondary | ICD-10-CM

## 2021-03-16 DIAGNOSIS — E785 Hyperlipidemia, unspecified: Secondary | ICD-10-CM

## 2021-03-16 DIAGNOSIS — Z170 Estrogen receptor positive status [ER+]: Secondary | ICD-10-CM

## 2021-03-16 DIAGNOSIS — C50812 Malignant neoplasm of overlapping sites of left female breast: Secondary | ICD-10-CM

## 2021-03-16 DIAGNOSIS — E1169 Type 2 diabetes mellitus with other specified complication: Secondary | ICD-10-CM

## 2021-03-16 DIAGNOSIS — I10 Essential (primary) hypertension: Secondary | ICD-10-CM | POA: Diagnosis not present

## 2021-03-16 LAB — MICROALBUMIN / CREATININE URINE RATIO
Creatinine,U: 164.5 mg/dL
Microalb Creat Ratio: 0.6 mg/g (ref 0.0–30.0)
Microalb, Ur: 1 mg/dL (ref 0.0–1.9)

## 2021-03-16 LAB — CBC
HCT: 40.5 % (ref 36.0–46.0)
Hemoglobin: 13.6 g/dL (ref 12.0–15.0)
MCHC: 33.5 g/dL (ref 30.0–36.0)
MCV: 80.6 fl (ref 78.0–100.0)
Platelets: 187 10*3/uL (ref 150.0–400.0)
RBC: 5.02 Mil/uL (ref 3.87–5.11)
RDW: 14.5 % (ref 11.5–15.5)
WBC: 5.5 10*3/uL (ref 4.0–10.5)

## 2021-03-16 LAB — COMPREHENSIVE METABOLIC PANEL
ALT: 16 U/L (ref 0–35)
AST: 14 U/L (ref 0–37)
Albumin: 4.2 g/dL (ref 3.5–5.2)
Alkaline Phosphatase: 77 U/L (ref 39–117)
BUN: 22 mg/dL (ref 6–23)
CO2: 27 mEq/L (ref 19–32)
Calcium: 9.9 mg/dL (ref 8.4–10.5)
Chloride: 104 mEq/L (ref 96–112)
Creatinine, Ser: 1.41 mg/dL — ABNORMAL HIGH (ref 0.40–1.20)
GFR: 39.97 mL/min — ABNORMAL LOW (ref >60.00)
Glucose, Bld: 97 mg/dL (ref 70–99)
Potassium: 3.9 mEq/L (ref 3.5–5.1)
Sodium: 140 mEq/L (ref 135–145)
Total Bilirubin: 0.6 mg/dL (ref 0.2–1.2)
Total Protein: 7.5 g/dL (ref 6.0–8.3)

## 2021-03-16 LAB — LIPID PANEL
Cholesterol: 154 mg/dL (ref 0–200)
HDL: 47.8 mg/dL (ref >39.00)
LDL Cholesterol: 77 mg/dL (ref 0–99)
NonHDL: 105.98
Total CHOL/HDL Ratio: 3
Triglycerides: 147 mg/dL (ref 0.0–149.0)
VLDL: 29.4 mg/dL (ref 0.0–40.0)

## 2021-03-16 LAB — HEMOGLOBIN A1C: Hgb A1c MFr Bld: 6.2 % (ref 4.6–6.5)

## 2021-03-16 LAB — VITAMIN B12: Vitamin B-12: >1506 pg/mL — ABNORMAL HIGH (ref 211–911)

## 2021-03-16 MED ORDER — METFORMIN HCL 500 MG PO TABS: ORAL_TABLET | ORAL | 3 refills | Status: DC

## 2021-03-16 NOTE — Assessment & Plan Note (Signed)
Flu shot counseled. Covid-19 3 shots counseled. Pneumonia up to date until 27. Shingrix counseled. Tetanus up to date. Colonoscopy up to date. Mammogram up to date, pap smear needs done. Counseled about sun safety and mole surveillance. Counseled about the dangers of distracted driving. Given 10 year screening recommendations.

## 2021-03-16 NOTE — Assessment & Plan Note (Signed)
BP at goal on atenolol 1/2 tablet daily. Checking CMP and adjust as needed. If needed more treatment ACE-I or ARB would be appropriate due to concurrent DM.

## 2021-03-16 NOTE — Assessment & Plan Note (Signed)
Checking HgA1c, foot exam done. Checking microalbumin to creatinine ratio. Seeing endo [facility] and getting labs with them every 6 months. Refilled metformin 500 mg daily. Is not on ACE-I or ARB but is on statin. Adjust as needed. Complicated by past stroke.

## 2021-03-16 NOTE — Patient Instructions (Addendum)
Health Maintenance, Female Adopting a healthy lifestyle and getting preventive care are important in promoting health and wellness. Ask your health care provider about:  The right schedule for you to have regular tests and exams.  Things you can do on your own to prevent diseases and keep yourself healthy. What should I know about diet, weight, and exercise? Eat a healthy diet  Eat a diet that includes plenty of vegetables, fruits, low-fat dairy products, and lean protein.  Do not eat a lot of foods that are high in solid fats, added sugars, or sodium.   Maintain a healthy weight Body mass index (BMI) is used to identify weight problems. It estimates body fat based on height and weight. Your health care provider can help determine your BMI and help you achieve or maintain a healthy weight. Get regular exercise Get regular exercise. This is one of the most important things you can do for your health. Most adults should:  Exercise for at least 150 minutes each week. The exercise should increase your heart rate and make you sweat (moderate-intensity exercise).  Do strengthening exercises at least twice a week. This is in addition to the moderate-intensity exercise.  Spend less time sitting. Even light physical activity can be beneficial. Watch cholesterol and blood lipids Have your blood tested for lipids and cholesterol at 63 years of age, then have this test every 5 years. Have your cholesterol levels checked more often if:  Your lipid or cholesterol levels are high.  You are older than 63 years of age.  You are at high risk for heart disease. What should I know about cancer screening? Depending on your health history and family history, you may need to have cancer screening at various ages. This may include screening for:  Breast cancer.  Cervical cancer.  Colorectal cancer.  Skin cancer.  Lung cancer. What should I know about heart disease, diabetes, and high blood  pressure? Blood pressure and heart disease  High blood pressure causes heart disease and increases the risk of stroke. This is more likely to develop in people who have high blood pressure readings, are of African descent, or are overweight.  Have your blood pressure checked: ? Every 3-5 years if you are 18-39 years of age. ? Every year if you are 40 years old or older. Diabetes Have regular diabetes screenings. This checks your fasting blood sugar level. Have the screening done:  Once every three years after age 40 if you are at a normal weight and have a low risk for diabetes.  More often and at a younger age if you are overweight or have a high risk for diabetes. What should I know about preventing infection? Hepatitis B If you have a higher risk for hepatitis B, you should be screened for this virus. Talk with your health care provider to find out if you are at risk for hepatitis B infection. Hepatitis C Testing is recommended for:  Everyone born from 1945 through 1965.  Anyone with known risk factors for hepatitis C. Sexually transmitted infections (STIs)  Get screened for STIs, including gonorrhea and chlamydia, if: ? You are sexually active and are younger than 63 years of age. ? You are older than 63 years of age and your health care provider tells you that you are at risk for this type of infection. ? Your sexual activity has changed since you were last screened, and you are at increased risk for chlamydia or gonorrhea. Ask your health care provider   if you are at risk.  Ask your health care provider about whether you are at high risk for HIV. Your health care provider may recommend a prescription medicine to help prevent HIV infection. If you choose to take medicine to prevent HIV, you should first get tested for HIV. You should then be tested every 3 months for as long as you are taking the medicine. Pregnancy  If you are about to stop having your period (premenopausal) and  you may become pregnant, seek counseling before you get pregnant.  Take 400 to 800 micrograms (mcg) of folic acid every day if you become pregnant.  Ask for birth control (contraception) if you want to prevent pregnancy. Osteoporosis and menopause Osteoporosis is a disease in which the bones lose minerals and strength with aging. This can result in bone fractures. If you are 65 years old or older, or if you are at risk for osteoporosis and fractures, ask your health care provider if you should:  Be screened for bone loss.  Take a calcium or vitamin D supplement to lower your risk of fractures.  Be given hormone replacement therapy (HRT) to treat symptoms of menopause. Follow these instructions at home: Lifestyle  Do not use any products that contain nicotine or tobacco, such as cigarettes, e-cigarettes, and chewing tobacco. If you need help quitting, ask your health care provider.  Do not use street drugs.  Do not share needles.  Ask your health care provider for help if you need support or information about quitting drugs. Alcohol use  Do not drink alcohol if: ? Your health care provider tells you not to drink. ? You are pregnant, may be pregnant, or are planning to become pregnant.  If you drink alcohol: ? Limit how much you use to 0-1 drink a day. ? Limit intake if you are breastfeeding.  Be aware of how much alcohol is in your drink. In the [place] one drink equals one 12 oz bottle of beer (355 mL), one 5 oz glass of wine (148 mL), or one 1 oz glass of hard liquor (44 mL). General instructions  Schedule regular health, dental, and eye exams.  Stay current with your vaccines.  Tell your health care provider if: ? You often feel depressed. ? You have ever been abused or do not feel safe at home. Summary  Adopting a healthy lifestyle and getting preventive care are important in promoting health and wellness.  Follow your health care provider's instructions about healthy  diet, exercising, and getting tested or screened for diseases.  Follow your health care provider's instructions on monitoring your cholesterol and blood pressure. This information is not intended to replace advice given to you by your health care provider. Make sure you discuss any questions you have with your health care provider. Document Revised: 11/08/2018 Document Reviewed: 11/08/2018 Elsevier Patient Education  2021 [employer]vier Inc.  

## 2021-03-16 NOTE — Assessment & Plan Note (Signed)
Taking arimidex still and overall stable.

## 2021-03-16 NOTE — Progress Notes (Signed)
   Subjective:   Patient ID: Audrey Fischer, female    DOB: 06/27/1956, 65 y.o.   MRN: [id number]  HPI The patient is a 65 YO female coming in for physical. Is exercising again lately.   PMH, [facility], social history reviewed and updated  Review of Systems  Constitutional: Negative.   HENT: Negative.   Eyes: Negative.   Respiratory: Negative for cough, chest tightness and shortness of breath.   Cardiovascular: Negative for chest pain, palpitations and leg swelling.  Gastrointestinal: Negative for abdominal distention, abdominal pain, constipation, diarrhea, nausea and vomiting.  Musculoskeletal: Negative.   Skin: Negative.   Neurological: Negative.   Psychiatric/Behavioral: Negative.     Objective:  Physical Exam Constitutional:      Appearance: She is well-developed.  HENT:     Head: Normocephalic and atraumatic.  Cardiovascular:     Rate and Rhythm: Normal rate and regular rhythm.  Pulmonary:     Effort: Pulmonary effort is normal. No respiratory distress.     Breath sounds: Normal breath sounds. No wheezing or rales.  Abdominal:     General: Bowel sounds are normal. There is no distension.     Palpations: Abdomen is soft.     Tenderness: There is no abdominal tenderness. There is no rebound.  Musculoskeletal:     Cervical back: Normal range of motion.  Skin:    General: Skin is warm and dry.     Comments: Foot exam done  Neurological:     Mental Status: She is alert and oriented to person, place, and time.     Coordination: Coordination normal.     Vitals:   03/16/21 1014  BP: 130/72  Pulse: 63  Resp: 18  Temp: 98.4 F (36.9 C)  TempSrc: Oral  SpO2: 94%  Weight: 180 lb 3.2 oz (81.7 kg)  Height: 5\' 8"  (1.727 m)    This visit occurred during the SARS-CoV-2 public health emergency.  Safety protocols were in place, including screening questions prior to the visit, additional usage of staff PPE, and extensive cleaning of exam room while observing appropriate contact  time as indicated for disinfecting solutions.   Assessment & Plan:

## 2021-03-16 NOTE — Assessment & Plan Note (Signed)
Checking lipid panel and adjust as needed her crestor 40 mg daily.

## 2021-03-16 NOTE — Assessment & Plan Note (Signed)
Checking HgA1c and adjust as needed. BP at goal. No new symptoms and taking aspirin 81 mg daily.

## 2021-04-04 ENCOUNTER — Other Ambulatory Visit: Payer: Self-pay | Admitting: Internal Medicine

## 2021-04-07 ENCOUNTER — Encounter: Payer: Self-pay | Admitting: Internal Medicine

## 2021-04-07 ENCOUNTER — Ambulatory Visit: Payer: BC Managed Care – PPO | Admitting: Internal Medicine

## 2021-04-07 ENCOUNTER — Other Ambulatory Visit: Payer: Self-pay

## 2021-04-07 VITALS — BP 138/80 | HR 59 | Ht 68.0 in | Wt 180.2 lb

## 2021-04-07 DIAGNOSIS — I2510 Atherosclerotic heart disease of native coronary artery without angina pectoris: Secondary | ICD-10-CM

## 2021-04-07 NOTE — Progress Notes (Signed)
Cardiology Office Note:    Date:  09/18/2020   ID:  Audrey Fischer, DOB Nov 23, 1956, MRN [id number]  PCP:  Audrey Koch, MD  Cardiologist:  Elouise Munroe, MD  Electrophysiologist:  None   Referring MD: Audrey Fischer, *   Chief Complaint/Reason for Referral: NSTEMI, stroke  History of Present Illness:    Audrey Fischer is a 65 y.o. female with a history of NSTEMI in March 2009 with LAD and OM stents, hypertension, hyperlipidemia, and history of breast cancer on anastrozole.  She also has a history of stroke in Dec 2020. She suffered a stroke with symptoms starting on December 13 presenting with right hand numbness and tingling as well as gait disturbance.  MRI showed acute infarct in the posterior external capsule on the left.  She was recommended to take Plavix for 3 weeks in addition to indefinite aspirin therapy 81 mg daily.  Crestor was increased to 40 mg daily.  She was diagnosed with diabetes with an A1c of 9.7.  Today, she is excited for her son who was recently awarded a scholarship at [employer] She is feeling pretty good overall. Her blood pressure remains well controlled and she is tolerating her medications.   She has started walking again, and normally walks about 3 miles a day for 5 days of the week. She is working towards a goal of walking a mile in about 19 minutes. 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 Currently she is losing weight at a rate of 1 pound per week. Her goal is to lose 20 more pounds to no longer be "officially obese."   She denies any chest pain, shortness of breath, or palpitations. No pre-syncope, syncope, or lightheadedness/dizziness to note. Also has no lower extremity edema, orthopnea or PND.   Past Medical History:  Diagnosis Date  . Breast cancer ([place]) 07/09/15   left breast,  lower-inner quadrant   . Coronary artery disease    cardiologist-   dr Donnetta Hutching [facility], [facility] cardiology in [facility]---  hx PCI w/ stents to OM and LAD 02-08-2008  . Diabetes mellitus without complication ([place])   . History of radiation therapy    completed 02-05-2016  . Hyperlipidemia   . Hypertension   . PMB (postmenopausal bleeding)   . S/P coronary artery stent placement   . Stroke [facility])    10/2019    Past Surgical History:  Procedure Laterality Date  . BREAST LUMPECTOMY  09/23/2015  . CESAREAN SECTION  10-09-1996  . COLONOSCOPY    . CORONARY ANGIOPLASTY WITH STENT PLACEMENT  02-08-2008   dr Donnetta Hutching   PCI and stent to OM and LAD  . DILATATION & CURRETTAGE/HYSTEROSCOPY WITH RESECTOCOPE N/A 08/15/2015   Procedure: DILATATION & CURETTAGE/HYSTEROSCOPY ;  Surgeon: Eldred Manges, MD;  Location: [place];  Service: Gynecology;  Laterality: N/A;  . DILATION AND CURETTAGE OF UTERUS  1994   blighted ovum    Current Medications: Current Meds  Medication Sig  . anastrozole (ARIMIDEX) 1 MG tablet Take 1 tablet (1 mg total) by mouth daily.  . Ascorbic Acid (VITAMIN C GUMMIE PO) Take 2,000 mg by mouth daily.  Marland Kitchen aspirin 81 MG tablet Take 81 mg by mouth daily.  Marland Kitchen atenolol (TENORMIN) 100 MG tablet Take 0.5 tablets (50 mg total) by mouth at bedtime.  . Cholecalciferol (VITAMIN D3) 2000 UNITS TABS Take 1 tablet  by mouth daily.  . metFORMIN (GLUCOPHAGE) 500 MG tablet TAKE 1 TABLET BY MOUTH EVERY DAY WITH BREAKFAST  . nitroGLYCERIN (NITROSTAT) 0.4 MG SL tablet Place 0.4 mg under the tongue every 5 (five) minutes as needed for chest pain.  . rosuvastatin (CRESTOR) 40 MG tablet Take 1 tablet (40 mg total) by mouth daily.  . vitamin B-12 (CYANOCOBALAMIN) 500 MCG tablet Take 1 tablet (500 mcg total) by mouth daily.     Allergies:   Capsule [gelatin] and Capsaicin   Social History   Tobacco Use  . Smoking status: Never Smoker  . Smokeless tobacco: Never Used  Vaping Use  . Vaping Use: Unknown  Substance Use Topics  .  Alcohol use: Yes    Alcohol/week: 0.0 standard drinks    Comment: occasionally  . Drug use: No     Family History: The patient's family history includes Colon cancer (age of onset: 68) in her mother; Colon polyps in her father; Heart disease in her father and mother; Stomach cancer in her maternal aunt; Stroke in her mother. There is no history of Esophageal cancer or Rectal cancer.  ROS:   Please see the history of present illness.    All other systems reviewed and are negative.  EKGs/Labs/Other Studies Reviewed:    The following studies were reviewed today:  Echo 11/13/2019: 1. Left ventricular ejection fraction, by visual estimation, is 60 to  65%. The left ventricle has normal function. There is no left ventricular  hypertrophy.  2. Elevated left atrial pressure.  3. Left ventricular diastolic parameters are consistent with Grade II  diastolic dysfunction (pseudonormalization).  4. The left ventricle has no regional wall motion abnormalities.  5. Global right ventricle has normal systolic function.The right  ventricular size is normal. No increase in right ventricular wall  thickness.  6. Left atrial size was normal.  7. Right atrial size was normal.  8. The mitral valve is normal in structure. Mild mitral valve  regurgitation. No evidence of mitral stenosis.  9. The tricuspid valve is normal in structure. Tricuspid valve  regurgitation is mild.  10. The aortic valve is normal in structure. Aortic valve regurgitation is  not visualized. No evidence of aortic valve sclerosis or stenosis.  11. The pulmonic valve was normal in structure. Pulmonic valve  regurgitation is not visualized.  12. Moderately elevated pulmonary artery systolic pressure.  13. The inferior vena cava is normal in size with greater than 50%  respiratory variability, suggesting right atrial pressure of 3 mmHg.  CT Angio Head/Neck 11/13/2019: IMPRESSION: Evolving infarction of the left basal  ganglia and adjacent white matter. No acute intracranial hemorrhage.  No large vessel occlusion. No hemodynamically significant stenosis in the neck. Multifocal intracranial atherosclerosis.  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 Postcontrast MR imaging through the orbits is recommended.  EKG:   04/07/2021: Sinus bradycardia, rate 59 bpm, LVH 09/18/2020: Sinus bradycardia, LVH  Recent Labs: 03/16/2021: ALT 16; BUN 22; Creatinine, Ser 1.41; Hemoglobin 13.6; Platelets 187.0; Potassium 3.9; Sodium 140  Recent Lipid Panel    Component Value Date/Time   CHOL 154 03/16/2021 1046   TRIG 147.0 03/16/2021 1046   HDL 47.80 03/16/2021 1046   CHOLHDL 3 03/16/2021 1046   VLDL 29.4 03/16/2021 1046   LDLCALC 77 03/16/2021 1046    Physical Exam:    VS:  BP 138/80   Pulse (!) 59   Ht 5\' 8"  (1.727 m)   Wt 180 lb 3.2 oz (81.7 kg)  SpO2 99%   BMI 27.40 kg/m     Wt Readings from Last 5 Encounters:  04/07/21 180 lb 3.2 oz (81.7 kg)  03/16/21 180 lb 3.2 oz (81.7 kg)  02/05/21 185 lb (83.9 kg)  01/22/21 185 lb (83.9 kg)  09/24/20 183 lb (83 kg)    Constitutional: No acute distress Eyes: sclera non-icteric, normal conjunctiva and lids ENMT: normal dentition, moist mucous membranes Cardiovascular: regular rhythm, normal rate, no murmurs. S1 and S2 normal. Radial pulses normal bilaterally. No jugular venous distention.  Respiratory: clear to auscultation bilaterally GI : normal bowel sounds, soft and nontender. No distention.   MSK: extremities warm, well perfused. No edema.  NEURO: grossly nonfocal exam, moves all extremities. PSYCH: alert and oriented x 3, normal mood and affect.   ASSESSMENT:    1. Coronary artery disease involving native coronary artery of native heart without angina pectoris    PLAN:    Coronary artery disease involving native coronary artery of native heart without angina pectoris Non-ST elevation (NSTEMI) myocardial infarction ([place]) -  Plan: EKG 12-Lead History of percutaneous coronary intervention - ASA 81 mg indefinitely - continue BB, crestor - no chest pain or DOE.  Hypertension, unspecified type - stable, no changes  HLD - continue crestor, lipids at goal.   Total time of encounter: 20 minutes total time of encounter, including 15 minutes spent in face-to-face patient care on the date of this encounter. This time includes coordination of care and counseling regarding above mentioned problem list. Remainder of non-face-to-face time involved reviewing chart documents/testing relevant to the patient encounter and documentation in the medical record. I have independently reviewed documentation from referring provider.   Cherlynn Kaiser, MD [place]  [facility]    Medication Adjustments/Labs and Tests Ordered: Current medicines are reviewed at length with the patient today.  Concerns regarding medicines are outlined above.   Orders Placed This Encounter  Procedures  . EKG 12-Lead    No orders of the defined types were placed in this encounter.   Patient Instructions  Medication Instructions:  No Changes In Medications at this time.  *If you need a refill on your cardiac medications before your next appointment, please call your pharmacy*  Follow-Up: At [facility], you and your health needs are our priority.  As part of our continuing mission to provide you with exceptional heart care, we have created designated Provider Care Teams.  These Care Teams include your primary Cardiologist (physician) and Advanced Practice Providers (APPs -  Physician Assistants and Nurse Practitioners) who all work together to provide you with the care you need, when you need it.  We recommend signing up for the patient portal called "MyChart".  Sign up information is provided on this After Visit Summary.  MyChart is used to connect with patients for Virtual Visits (Telemedicine).  Patients are able to view lab/test  results, encounter notes, upcoming appointments, etc.  Non-urgent messages can be sent to your provider as well.   To learn more about what you can do with MyChart, go to [email or web address].    Your next appointment:   6 month(s)  The format for your next appointment:   Virtual Visit   Provider:   Cherlynn Kaiser, MD      [facility],acting as a scribe for Elouise Munroe, MD.,have documented all relevant documentation on the behalf of Elouise Munroe, MD,as directed by  Elouise Munroe, MD while in the presence of Elouise Munroe, MD.  Henriette Combs  Stann Mainland, MD, have reviewed all documentation for this visit. The documentation on 04/07/21 for the exam, diagnosis, procedures, and orders are all accurate and complete.

## 2021-04-07 NOTE — Patient Instructions (Signed)
Medication Instructions:  No Changes In Medications at this time.  *If you need a refill on your cardiac medications before your next appointment, please call your pharmacy*  Follow-Up: At [facility], you and your health needs are our priority.  As part of our continuing mission to provide you with exceptional heart care, we have created designated Provider Care Teams.  These Care Teams include your primary Cardiologist (physician) and Advanced Practice Providers (APPs -  Physician Assistants and Nurse Practitioners) who all work together to provide you with the care you need, when you need it.  We recommend signing up for the patient portal called "MyChart".  Sign up information is provided on this After Visit Summary.  MyChart is used to connect with patients for Virtual Visits (Telemedicine).  Patients are able to view lab/test results, encounter notes, upcoming appointments, etc.  Non-urgent messages can be sent to your provider as well.   To learn more about what you can do with MyChart, go to [email or web address].    Your next appointment:   6 month(s)  The format for your next appointment:   Virtual Visit   Provider:   Cherlynn Kaiser, MD

## 2021-04-08 ENCOUNTER — Telehealth: Payer: Self-pay | Admitting: Internal Medicine

## 2021-04-08 ENCOUNTER — Other Ambulatory Visit: Payer: Self-pay | Admitting: Internal Medicine

## 2021-04-08 NOTE — Telephone Encounter (Signed)
Type of form received: Critical Illness Claim  Additional comments: N/A  Received by: [place] R.   Form should be Faxed to: [phone] or emailed to [email or web address]  Form should be mailed to:  N/A  Is patient requesting call for pickup: Yes    Form placed in the Provider's box.  *Attach charge sheet.  Provider will determine charge.*  Was patient informed of  7-10 business day turn around (Y/N)? Darreld Mclean

## 2021-04-09 NOTE — Telephone Encounter (Signed)
Form has been placed in your box for signature.

## 2021-05-13 ENCOUNTER — Telehealth: Payer: Self-pay | Admitting: *Deleted

## 2021-05-13 NOTE — Telephone Encounter (Signed)
No answer imaging sent 2 to [facility].

## 2021-12-03 ENCOUNTER — Telehealth: Payer: Self-pay | Admitting: Internal Medicine

## 2021-12-03 NOTE — Telephone Encounter (Signed)
Called pt she states her symptoms has not been going on for a month, "it's been shorter than that. It's chest tightness I think it's anxiety related. It has felt like this before, but has gotten worst since starting back to work." Pt advised to start with her PCP since she thinks it is anxiety related but we are willing to see her in office to evaluate her. Patient encouraged to go to the Emergency Department for worsening symptoms. She verbalized understanding no further concerns expressed at this time.

## 2021-12-03 NOTE — Telephone Encounter (Signed)
Pt c/o of Chest Pain: STAT if CP now or developed within 24 hours  1. Are you having CP right now?  Chest tightness, denies pain. No symptoms currently.  2. Are you experiencing any other symptoms (ex. SOB, nausea, vomiting, sweating)?  No  3. How long have you been experiencing CP?  About 1 month, per patient  4. Is your CP continuous or coming and going?  Coming and going   5. Have you taken Nitroglycerin?  No  ?

## 2022-01-18 NOTE — Progress Notes (Unsigned)
Cardiology Office Note:    Date:  01/18/2022   ID:  Audrey Fischer, DOB 1956-06-04, MRN [id number]  PCP:  Hoyt Koch, MD   [place] Providers Cardiologist:  Elouise Munroe, MD { Click to update primary MD,subspecialty MD or APP then REFRESH:1}    Referring MD: Hoyt Koch, *   No chief complaint on file. ***  History of Present Illness:    Audrey Fischer is a 66 y.o. female with a hx of CAD, HTN, hyperlipidemia, breast cancer, and stroke.   Cardiac history is significant for NSTEMI March 2009 with LAD and OM stents. She suffered a stroke in December 2020 that initially presented with right hand numbness and tingling as well as gait disturbance. MRI revealed acute infarct in posterior external capsule on the left. She was advised to take Plavix for 3 weeks in addition to indefinite aspirin 81 mg therapy. She was diagnosed with diabetes with A1C of 9.7  She was last seen in our office by Dr. Margaretann Loveless on 04/07/21 and reported she was on a weight loss regimen and was exercising walking 3 miles 5 days per week. No changes were made to her treatment plan and she was advised to  follow-up in 6 months.   She called our office on 12/03/21 to report chest tightness that  worsened when she returned to work, felt that it is related to anxiety. She was encouraged to follow-up with her PCP and also offered an appointment with [place].   Today, she is here   Med adjustments Labs   Past Medical History:  Diagnosis Date   Breast cancer ([place]) 07/09/15   left breast,  lower-inner quadrant    Coronary artery disease    cardiologist-  dr Donnetta Hutching [facility] physicians, [facility]ology in [facility])---  hx PCI w/ stents to OM and LAD 02-08-2008   Diabetes mellitus without complication ([place])    History of radiation therapy    completed 02-05-2016   Hyperlipidemia    Hypertension    PMB (postmenopausal bleeding)    S/P coronary artery stent placement    Stroke ([place])     10/2019    Past Surgical History:  Procedure Laterality Date   BREAST LUMPECTOMY  09/23/2015   CESAREAN SECTION  10-09-1996   COLONOSCOPY     CORONARY ANGIOPLASTY WITH STENT PLACEMENT  02-08-2008   dr Donnetta Hutching   PCI and stent to OM and LAD   DILATATION & CURRETTAGE/HYSTEROSCOPY WITH RESECTOCOPE N/A 08/15/2015   Procedure: DILATATION & CURETTAGE/HYSTEROSCOPY ;  Surgeon: Eldred Manges, MD;  Location: [place];  Service: Gynecology;  Laterality: N/A;   DILATION AND CURETTAGE OF UTERUS  1994   blighted ovum    Current Medications: No outpatient medications have been marked as taking for the 01/19/22 encounter (Appointment) with Almyra Deforest, [place].     Allergies:   Capsule [gelatin] and Capsaicin   Social History   Socioeconomic History   Marital status: Single    Spouse name: Not on file   Number of children: Not on file   Years of education: Not on file   Highest education level: Not on file  Occupational History   Not on file  Tobacco Use   Smoking status: Never   Smokeless tobacco: Never  Vaping Use   Vaping Use: Unknown  Substance and Sexual Activity   Alcohol use: Yes    Alcohol/week: 0.0 standard drinks    Comment: occasionally   Drug use:  No   Sexual activity: Not on file  Other Topics Concern   Not on file  Social History Narrative   Not on file   Social Determinants of Health   Financial Resource Strain: Not on file  Food Insecurity: Not on file  Transportation Needs: Not on file  Physical Activity: Not on file  Stress: Not on file  Social Connections: Not on file     Family History: The patient's ***family history includes Colon cancer (age of onset: 50) in her mother; Colon polyps in her father; Heart disease in her father and mother; Stomach cancer in her maternal aunt; Stroke in her mother. There is no history of Esophageal cancer or Rectal cancer.  ROS:   Please see the history of present illness.    *** All other systems reviewed  and are negative.  Labs/Other Studies Reviewed:    The following studies were reviewed today:  Echo 11/13/19  Left Ventricle: Left ventricular ejection fraction, by visual estimation,  is 60 to 65%. The left ventricle has normal function. The left ventricle  has no regional wall motion abnormalities. There is no left ventricular  hypertrophy. Left ventricular  diastolic parameters are consistent with Grade II diastolic dysfunction  (pseudonormalization). Elevated left atrial pressure.  Right Ventricle: The right ventricular size is normal. No increase in  right ventricular wall thickness. Global RV systolic function is has  normal systolic function. The tricuspid regurgitant velocity is 3.00 m/s,  and with an assumed right atrial pressure   of 8 mmHg, the estimated right ventricular systolic pressure is  moderately elevated at 44.0 mmHg.  Left Atrium: Left atrial size was normal in size.  Right Atrium: Right atrial size was normal in size  Pericardium: There is no evidence of pericardial effusion.  Mitral Valve: The mitral valve is normal in structure. Mild mitral valve  regurgitation. No evidence of mitral valve stenosis by observation.  Tricuspid Valve: The tricuspid valve is normal in structure. Tricuspid  valve regurgitation is mild.  Aortic Valve: The aortic valve is normal in structure. Aortic valve  regurgitation is not visualized. The aortic valve is structurally normal,  with no evidence of sclerosis or stenosis.  Pulmonic Valve: The pulmonic valve was normal in structure. Pulmonic valve  regurgitation is not visualized. Pulmonic regurgitation is not visualized.  Aorta: The aortic root, ascending aorta and aortic arch are all  structurally normal, with no evidence of dilitation or obstruction.  Venous: The inferior vena cava is normal in size with greater than 50%  respiratory variability, suggesting right atrial pressure of 3 mmHg.  IAS/Shunts: No atrial level shunt  detected by color flow Doppler. Agitated  saline contrast was given intravenously to evaluate for intracardiac  shunting. Saline contrast bubble study was negative, with no evidence of  any interatrial shunt. There is no  evidence of a patent foramen ovale. No ventricular septal defect is seen  or detected. There is no evidence of an atrial septal defect.   Recent Labs: 03/16/2021: ALT 16; BUN 22; Creatinine, Ser 1.41; Hemoglobin 13.6; Platelets 187.0; Potassium 3.9; Sodium 140  Recent Lipid Panel    Component Value Date/Time   CHOL 154 03/16/2021 1046   TRIG 147.0 03/16/2021 1046   HDL 47.80 03/16/2021 1046   CHOLHDL 3 03/16/2021 1046   VLDL 29.4 03/16/2021 1046   LDLCALC 77 03/16/2021 1046     Risk Assessment/Calculations:   {Does this patient have ATRIAL FIBRILLATION?:[phone]}       Physical Exam:  VS:  There were no vitals taken for this visit.    Wt Readings from Last 3 Encounters:  04/07/21 180 lb 3.2 oz (81.7 kg)  03/16/21 180 lb 3.2 oz (81.7 kg)  02/05/21 185 lb (83.9 kg)     GEN: *** Well nourished, well developed in no acute distress HEENT: Normal NECK: No JVD; No carotid bruits CARDIAC: ***RRR, no murmurs, rubs, gallops RESPIRATORY:  Clear to auscultation without rales, wheezing or rhonchi  ABDOMEN: Soft, non-tender, non-distended MUSCULOSKELETAL:  No edema; No deformity. *** pedal pulses, ***bilaterally SKIN: Warm and dry NEUROLOGIC:  Alert and oriented x 3 PSYCHIATRIC:  Normal affect   EKG:  EKG is *** ordered today.  The ekg ordered today demonstrates ***  Diagnoses:    No diagnosis found. Assessment and Plan:     ***          {Are you ordering a CV Procedure (e.g. stress test, cath, DCCV, TEE, etc)?   Press F2        :[id number]}    Medication Adjustments/Labs and Tests Ordered: Current medicines are reviewed at length with the patient today.  Concerns regarding medicines are outlined above.  No orders of the defined types were  placed in this encounter.  No orders of the defined types were placed in this encounter.   There are no Patient Instructions on file for this visit.   Signed, Emmaline Life, NP  01/18/2022 5:01 PM    [facility]

## 2022-01-19 ENCOUNTER — Ambulatory Visit: Payer: BC Managed Care – PPO | Admitting: Nurse Practitioner

## 2022-02-03 NOTE — Progress Notes (Signed)
Cardiology Office Note:    Date:  02/08/2022   ID:  Xoe T Medico, DOB 20-Jun-1956, MRN [id number]  PCP:  Hoyt Koch, MD   [facility] Providers Cardiologist:  Elouise Munroe, MD     Referring MD: Hoyt Koch, *   Chief Complaint: evaluation of high blood pressure  History of Present Illness:    Audrey Fischer is a 66 y.o. female with a hx of CAD s/p MI, s/p DES to OM and LAD 2009, DM, hyperlipidemia, breast cancer, and stroke.   She had NSTEMI in 01/2008 with LAD and OM stents, She suffered a stroke in 10/2019 with symptoms that presented with right hand numbness and tingling and gait disturbance. MRI showed acute infarct posterior external capsule on the left. She was recommended to take Plavix for 3 weeks in addition to indefinite aspirin 81 mg  She was last seen in our office by Dr. Margaretann Loveless on 04/07/21 at which time she reported working on weight loss and exercising by walking 3 miles 5 days per week. No changes were made to her medical regimen at that time and 6 month follow-up was recommended.   She phoned our office on 12/03/21 with concerns for chest pain that she felt could be r/t anxiety. She planned to see PCP but was also offered an appointment with our group.   Today, she is here for evaluation of elevated blood pressure. She was taking occasional blood pressure readings due to family history and noticed an increase in blood pressure in late November 2022. Home readings have been as high as 638V-564P systolic. In the winter, she typically exercises just on the weekend.  When the weather is warmer she will walk 3 miles 4 x per week. 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  Has been taking atenolol for many years and it has previously controlled her blood pressure well.  She denies chest pain, shortness of breath, lower extremity edema, fatigue, palpitations, melena, hematuria, diaphoresis, weakness, presyncope, syncope, orthopnea, and PND.  Past  Medical History:  Diagnosis Date   Breast cancer ([place]) 07/09/15   left breast,  lower-inner quadrant    Coronary artery disease    cardiologist-  dr Donnetta Hutching [facility], [facility] in [facility])---  hx PCI w/ stents to OM and LAD 02-08-2008   Diabetes mellitus without complication ([place])    History of radiation therapy    completed 02-05-2016   Hyperlipidemia    Hypertension    PMB (postmenopausal bleeding)    S/P coronary artery stent placement    Stroke ([place])    10/2019    Past Surgical History:  Procedure Laterality Date   BREAST LUMPECTOMY  09/23/2015   CESAREAN SECTION  10-09-1996   COLONOSCOPY     CORONARY ANGIOPLASTY WITH STENT PLACEMENT  02-08-2008   dr Donnetta Hutching   PCI and stent to OM and LAD   DILATATION & CURRETTAGE/HYSTEROSCOPY WITH RESECTOCOPE N/A 08/15/2015   Procedure: DILATATION & CURETTAGE/HYSTEROSCOPY ;  Surgeon: Eldred Manges, MD;  Location: [facility];  Service: Gynecology;  Laterality: N/A;   DILATION AND CURETTAGE OF UTERUS  1994   blighted ovum    Current Medications: Current Meds  Medication Sig   anastrozole (ARIMIDEX) 1 MG tablet Take 1 tablet (1 mg total) by mouth daily.   Ascorbic Acid (VITAMIN C GUMMIE PO) Take 2,000 mg by mouth daily.   aspirin 81 MG tablet Take 81 mg by mouth daily.  atenolol (TENORMIN) 100 MG tablet Take 0.5 tablets (50 mg total) by mouth at bedtime.   Cholecalciferol (VITAMIN D3) 2000 UNITS TABS Take 1 tablet by mouth daily.   metFORMIN (GLUCOPHAGE) 500 MG tablet TAKE 1 TABLET BY MOUTH EVERY DAY WITH BREAKFAST   nitroGLYCERIN (NITROSTAT) 0.4 MG SL tablet Place 0.4 mg under the tongue every 5 (five) minutes as needed for chest pain.   rosuvastatin (CRESTOR) 40 MG tablet Take 1 tablet (40 mg total) by mouth daily.   vitamin B-12 (CYANOCOBALAMIN) 500 MCG tablet Take 1 tablet (500 mcg total) by mouth daily.     Allergies:   Capsule [gelatin] and Capsaicin   Social History   Socioeconomic  History   Marital status: Single    Spouse name: Not on file   Number of children: Not on file   Years of education: Not on file   Highest education level: Not on file  Occupational History   Not on file  Tobacco Use   Smoking status: Never   Smokeless tobacco: Never  Vaping Use   Vaping Use: Unknown  Substance and Sexual Activity   Alcohol use: Yes    Alcohol/week: 0.0 standard drinks    Comment: occasionally   Drug use: No   Sexual activity: Not on file  Other Topics Concern   Not on file  Social History Narrative   Not on file   Social Determinants of Health   Financial Resource Strain: Not on file  Food Insecurity: Not on file  Transportation Needs: Not on file  Physical Activity: Not on file  Stress: Not on file  Social Connections: Not on file     Family History: The patient's family history includes Colon cancer (age of onset: 37) in her mother; Colon polyps in her father; Heart disease in her father and mother; Stomach cancer in her maternal aunt; Stroke in her mother. There is no history of Esophageal cancer or Rectal cancer.  ROS:   Please see the history of present illness.   All other systems reviewed and are negative.  Labs/Other Studies Reviewed:    The following studies were reviewed today:  Echo 10/2019  Left Ventricle: Left ventricular ejection fraction, by visual estimation,  is 60 to 65%. The left ventricle has normal function. The left ventricle  has no regional wall motion abnormalities. There is no left ventricular  hypertrophy. Left ventricular diastolic parameters are consistent with Grade II diastolic dysfunction (pseudonormalization). Elevated left atrial pressure.  Right Ventricle: The right ventricular size is normal. No increase in  right ventricular wall thickness. Global RV systolic function is has  normal systolic function. The tricuspid regurgitant velocity is 3.00 m/s,  and with an assumed right atrial pressure   of 8 mmHg, the  estimated right ventricular systolic pressure is  moderately elevated at 44.0 mmHg.  Left Atrium: Left atrial size was normal in size.  Right Atrium: Right atrial size was normal in size  Pericardium: There is no evidence of pericardial effusion.  Mitral Valve: The mitral valve is normal in structure. Mild mitral valve  regurgitation. No evidence of mitral valve stenosis by observation.  Tricuspid Valve: The tricuspid valve is normal in structure. Tricuspid  valve regurgitation is mild.  Aortic Valve: The aortic valve is normal in structure. Aortic valve  regurgitation is not visualized. The aortic valve is structurally normal,  with no evidence of sclerosis or stenosis.  Pulmonic Valve: The pulmonic valve was normal in structure. Pulmonic valve  regurgitation is not  visualized. Pulmonic regurgitation is not visualized.  Aorta: The aortic root, ascending aorta and aortic arch are all  structurally normal, with no evidence of dilitation or obstruction.  Venous: The inferior vena cava is normal in size with greater than 50%  respiratory variability, suggesting right atrial pressure of 3 mmHg.  IAS/Shunts: No atrial level shunt detected by color flow Doppler. Agitated  saline contrast was given intravenously to evaluate for intracardiac  shunting. Saline contrast bubble study was negative, with no evidence of  any interatrial shunt. There is no  evidence of a patent foramen ovale. No ventricular septal defect is seen  or detected. There is no evidence of an atrial septal defect.   CTA Head 10/2019  Evolving infarction of the left basal ganglia and adjacent white matter. No acute intracranial hemorrhage.   No large vessel occlusion. No hemodynamically significant stenosis in the neck. Multifocal intracranial atherosclerosis.   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 Postcontrast MR imaging through the orbits is recommended.  MRI Head  10/2019  IMPRESSION: Acute infarct in the posterior external capsule on the left. Mild chronic ischemia.   The patient refused to complete the study.  Recent Labs: 03/16/2021: ALT 16; BUN 22; Creatinine, Ser 1.41; Hemoglobin 13.6; Platelets 187.0; Potassium 3.9; Sodium 140  Recent Lipid Panel    Component Value Date/Time   CHOL 154 03/16/2021 1046   TRIG 147.0 03/16/2021 1046   HDL 47.80 03/16/2021 1046   CHOLHDL 3 03/16/2021 1046   VLDL 29.4 03/16/2021 1046   LDLCALC 77 03/16/2021 1046     Risk Assessment/Calculations:       Physical Exam:    VS:  BP 138/76 (BP Location: Left Arm, Patient Position: Sitting, Cuff Size: Normal)    Pulse (!) 52    Ht '5\' 8"'  (1.727 m)    Wt 178 lb (80.7 kg)    BMI 27.06 kg/m     Wt Readings from Last 3 Encounters:  02/08/22 178 lb (80.7 kg)  04/07/21 180 lb 3.2 oz (81.7 kg)  03/16/21 180 lb 3.2 oz (81.7 kg)     GEN:  Well nourished, well developed in no acute distress HEENT: Normal NECK: No JVD; No carotid bruits CARDIAC: RRR, no murmurs, rubs, gallops RESPIRATORY:  Clear to auscultation without rales, wheezing or rhonchi  ABDOMEN: Soft, non-tender, non-distended MUSCULOSKELETAL:  No edema; No deformity.  SKIN: Warm and dry NEUROLOGIC:  Alert and oriented x 3 PSYCHIATRIC:  Normal affect   EKG:  EKG is ordered today.  The ekg ordered today demonstrates sinus bradycardia at 52 bpm, no ST/T wave abnormality  Diagnoses:    1. Coronary artery disease involving native coronary artery of native heart without angina pectoris   2. History of MI (myocardial infarction)   3. History of percutaneous coronary intervention   4. Essential hypertension   5. Hyperlipidemia LDL goal <70   6. History of stroke    Assessment and Plan:     Essential hypertension: Blood pressure well-controlled today in the office. Has been elevated at home for approximately 5 months.  We discussed multiple options and based on her preference she would like to take  a once daily medication that does not cause diuresis.  Creatinine 1.4 on 03/06/21. We will check basic metabolic panel today.  If kidney function is normal, we will start valsartan 80 mg daily and recheck bmet in 1 to 2 weeks.  If creatinine is elevated, will start amlodipine 5 mg daily. Will have her return  in 1 month for APP or nurse visit for BP check.   Hyperlipidemia LDL goal < 70: LDL 77 on 03/16/21.  We will recheck lipids and liver function today.  Continue rosuvastatin.   CAD without angina: No chest pain, dyspnea, or activity intolerance.  No indication for further ischemic evaluation at this time.  No bleeding concerns on aspirin. Continue aspirin, statin, atenolol.   History of stroke: Monitors blood pressure carefully due to history of stroke.  Has noted increased blood pressure readings for the past few months as noted above. No concerns for visual changes, weakness, or change in speech.  Management per neurology.     Disposition: 1 month with APP   Medication Adjustments/Labs and Tests Ordered: Current medicines are reviewed at length with the patient today.  Concerns regarding medicines are outlined above.  Orders Placed This Encounter  Procedures   Comp Met (CMET)   Lipid Profile   EKG 12-Lead   No orders of the defined types were placed in this encounter.   Patient Instructions  Medication Instructions:   Will Start either Valsartan one tablet by mouth ( 80 mg) daily or Amlodpine one tablet by mouth ( 5 mg) daily when your results from your lab work comes back.   *If you need a refill on your cardiac medications before your next appointment, please call your pharmacy*   Lab Work:  TODAY!!!! CMET/LIPID  If you have labs (blood work) drawn today and your tests are completely normal, you will receive your results only by: [place] (if you have MyChart) OR A paper copy in the mail If you have any lab test that is abnormal or we need to change your treatment, we  will call you to review the results.    Follow-Up: At [facility], you and your health needs are our priority.  As part of our continuing mission to provide you with exceptional heart care, we have created designated Provider Care Teams.  These Care Teams include your primary Cardiologist (physician) and Advanced Practice Providers (APPs -  Physician Assistants and Nurse Practitioners) who all work together to provide you with the care you need, when you need it.  We recommend signing up for the patient portal called "MyChart".  Sign up information is provided on this After Visit Summary.  MyChart is used to connect with patients for Virtual Visits (Telemedicine).  Patients are able to view lab/test results, encounter notes, upcoming appointments, etc.  Non-urgent messages can be sent to your provider as well.   To learn more about what you can do with MyChart, go to [email or web address].    Your next appointment:   1 month(s)  The format for your next appointment:   In Person  Provider:   [name], NP           Signed, Emmaline Life, NP  02/08/2022 3:48 PM    [place]

## 2022-02-08 ENCOUNTER — Other Ambulatory Visit: Payer: Self-pay

## 2022-02-08 ENCOUNTER — Encounter: Payer: Self-pay | Admitting: Nurse Practitioner

## 2022-02-08 ENCOUNTER — Ambulatory Visit: Payer: BC Managed Care – PPO | Admitting: Nurse Practitioner

## 2022-02-08 VITALS — BP 138/76 | HR 52 | Ht 68.0 in | Wt 178.0 lb

## 2022-02-08 DIAGNOSIS — I252 Old myocardial infarction: Secondary | ICD-10-CM | POA: Diagnosis not present

## 2022-02-08 DIAGNOSIS — I2510 Atherosclerotic heart disease of native coronary artery without angina pectoris: Secondary | ICD-10-CM

## 2022-02-08 DIAGNOSIS — Z8673 Personal history of transient ischemic attack (TIA), and cerebral infarction without residual deficits: Secondary | ICD-10-CM

## 2022-02-08 DIAGNOSIS — I10 Essential (primary) hypertension: Secondary | ICD-10-CM

## 2022-02-08 DIAGNOSIS — E785 Hyperlipidemia, unspecified: Secondary | ICD-10-CM

## 2022-02-08 DIAGNOSIS — Z9861 Coronary angioplasty status: Secondary | ICD-10-CM

## 2022-02-08 NOTE — Patient Instructions (Signed)
Medication Instructions:  ? ?Will Start either Valsartan one tablet by mouth ( 80 mg) daily or Amlodpine one tablet by mouth ( 5 mg) daily when your results from your lab work comes back.  ? ?*If you need a refill on your cardiac medications before your next appointment, please call your pharmacy* ? ? ?Lab Work: ? ?TODAY!!!! CMET/LIPID ? ?If you have labs (blood work) drawn today and your tests are completely normal, you will receive your results only by: ?MyChart Message (if you have MyChart) OR ?A paper copy in the mail ?If you have any lab test that is abnormal or we need to change your treatment, we will call you to review the results. ? ? ? ?Follow-Up: ?At [facility], you and your health needs are our priority.  As part of our continuing mission to provide you with exceptional heart care, we have created designated Provider Care Teams.  These Care Teams include your primary Cardiologist (physician) and Advanced Practice Providers (APPs -  Physician Assistants and Nurse Practitioners) who all work together to provide you with the care you need, when you need it. ? ?We recommend signing up for the patient portal called "MyChart".  Sign up information is provided on this After Visit Summary.  MyChart is used to connect with patients for Virtual Visits (Telemedicine).  Patients are able to view lab/test results, encounter notes, upcoming appointments, etc.  Non-urgent messages can be sent to your provider as well.   ?To learn more about what you can do with MyChart, go to [email or web address].   ? ?Your next appointment:   ?1 month(s) ? ?The format for your next appointment:   ?In Person ? ?Provider:   ?[name], NP       ? ? ?

## 2022-02-09 LAB — COMPREHENSIVE METABOLIC PANEL
ALT: 17 IU/L (ref 0–32)
AST: 18 IU/L (ref 0–40)
Albumin/Globulin Ratio: 1.8 (ref 1.2–2.2)
Albumin: 4.6 g/dL (ref 3.8–4.8)
Alkaline Phosphatase: 72 IU/L (ref 44–121)
BUN/Creatinine Ratio: 12 (ref 12–28)
BUN: 19 mg/dL (ref 8–27)
Bilirubin Total: 0.3 mg/dL (ref 0.0–1.2)
CO2: 21 mmol/L (ref 20–29)
Calcium: 9.7 mg/dL (ref 8.7–10.3)
Chloride: 108 mmol/L — ABNORMAL HIGH (ref 96–106)
Creatinine, Ser: 1.55 mg/dL — ABNORMAL HIGH (ref 0.57–1.00)
Globulin, Total: 2.5 g/dL (ref 1.5–4.5)
Glucose: 91 mg/dL (ref 70–99)
Potassium: 4.3 mmol/L (ref 3.5–5.2)
Sodium: 144 mmol/L (ref 134–144)
Total Protein: 7.1 g/dL (ref 6.0–8.5)
eGFR: 37 mL/min/{1.73_m2} — ABNORMAL LOW (ref >59)

## 2022-02-09 LAB — LIPID PANEL
Chol/HDL Ratio: 2.4 ratio (ref 0.0–4.4)
Cholesterol, Total: 160 mg/dL (ref 100–199)
HDL: 68 mg/dL (ref >39)
LDL Chol Calc (NIH): 77 mg/dL (ref 0–99)
Triglycerides: 80 mg/dL (ref 0–149)
VLDL Cholesterol Cal: 15 mg/dL (ref 5–40)

## 2022-02-11 ENCOUNTER — Other Ambulatory Visit: Payer: Self-pay | Admitting: Nurse Practitioner

## 2022-02-11 MED ORDER — AMLODIPINE BESYLATE 2.5 MG PO TABS: 2.5000 mg | ORAL_TABLET | Freq: Every day | ORAL | 3 refills | Status: DC

## 2022-02-22 ENCOUNTER — Other Ambulatory Visit: Payer: Self-pay | Admitting: *Deleted

## 2022-02-22 DIAGNOSIS — E785 Hyperlipidemia, unspecified: Secondary | ICD-10-CM

## 2022-02-22 DIAGNOSIS — I10 Essential (primary) hypertension: Secondary | ICD-10-CM

## 2022-02-22 DIAGNOSIS — I2510 Atherosclerotic heart disease of native coronary artery without angina pectoris: Secondary | ICD-10-CM

## 2022-02-22 MED ORDER — AMLODIPINE BESYLATE 2.5 MG PO TABS: 5.0000 mg | ORAL_TABLET | Freq: Every day | ORAL | 3 refills | Status: DC

## 2022-03-05 ENCOUNTER — Telehealth: Payer: Self-pay | Admitting: *Deleted

## 2022-03-05 NOTE — Telephone Encounter (Signed)
S/w pt to confirmed nurse visit form upcoming appt on Monday, Apirl 10.  ?

## 2022-03-08 ENCOUNTER — Ambulatory Visit: Payer: BC Managed Care – PPO

## 2022-03-08 ENCOUNTER — Ambulatory Visit: Payer: BC Managed Care – PPO | Admitting: Nurse Practitioner

## 2022-03-08 ENCOUNTER — Ambulatory Visit: Payer: BC Managed Care – PPO | Admitting: Internal Medicine

## 2022-03-08 VITALS — BP 120/60 | HR 50 | Ht 68.0 in | Wt 173.0 lb

## 2022-03-08 DIAGNOSIS — I2510 Atherosclerotic heart disease of native coronary artery without angina pectoris: Secondary | ICD-10-CM | POA: Diagnosis not present

## 2022-03-08 MED ORDER — AMLODIPINE BESYLATE 2.5 MG PO TABS: 2.5000 mg | ORAL_TABLET | Freq: Every day | ORAL | Status: DC

## 2022-03-08 NOTE — Progress Notes (Signed)
? ?  Nurse Visit  ? ?Date of Encounter: 03/08/2022 ?ID: Audrey Fischer, DOB August 24, 1956, MRN [id number] ? ?PCP:  Hoyt Koch, MD ?  ?[facility] Providers ?Cardiologist:  Elouise Munroe, MD { ? ? ?Visit Details  ? ?VS:  BP 120/60 (BP Location: Left Arm, Patient Position: Sitting)   Pulse (!) 50   Ht '5\' 8"'$  (1.727 m)   Wt 173 lb (78.5 kg)   SpO2 99%   BMI 26.30 kg/m?  , BMI Body mass index is 26.3 kg/m?. ? ?Wt Readings from Last 3 Encounters:  ?03/08/22 173 lb (78.5 kg)  ?02/08/22 178 lb (80.7 kg)  ?04/07/21 180 lb 3.2 oz (81.7 kg)  ?  ? ?Reason for visit: BP Check ?Performed today: Vitals, Provider consulted:Michelle Swinyer, NP, and Education ?Changes (medications, testing, etc.) : NONE ?Length of Visit: 30 minutes ? ? ? ?Medications Adjustments/Labs and Tests Ordered: ?No orders of the defined types were placed in this encounter. ? ?Meds ordered this encounter  ?Medications  ? amLODipine (NORVASC) 2.5 MG tablet  ?  Sig: Take 1 tablet (2.5 mg total) by mouth daily.  ? ? ?Patient here today for BP check. Patient reports taking Amlodipine 2.'5mg'$  QD instead of '5mg'$  QD. BP well-controlled on 2.'5mg'$ , per [name], NP patient to remain on Amlodipine 2.'5mg'$  QD and Atenolol '50mg'$  QD. Patient to keep lab appointment 05/11/22 for Lipids, LFTs. Follow-up with Dr. Margaretann Loveless in 3-6 months. ? ?Signed, ?Molli Barrows, RN  ?03/08/2022 4:00 PM  ?

## 2022-03-08 NOTE — Patient Instructions (Signed)
Medication Instructions:  ?Your physician has recommended you make the following change in your medication:  ? ?1) Decrease Amlodipine 2.'5mg'$  once daily ? ?*If you need a refill on your cardiac medications before your next appointment, please call your pharmacy* ? ? ?Lab Work: ?05/11/2022: Lipids, LFTs ?If you have labs (blood work) drawn today and your tests are completely normal, you will receive your results only by: ?MyChart Message (if you have MyChart) OR ?A paper copy in the mail ?If you have any lab test that is abnormal or we need to change your treatment, we will call you to review the results. ? ? ?Testing/Procedures: ?NONE ? ? ?Follow-Up: ?At [facility], you and your health needs are our priority.  As part of our continuing mission to provide you with exceptional heart care, we have created designated Provider Care Teams.  These Care Teams include your primary Cardiologist (physician) and Advanced Practice Providers (APPs -  Physician Assistants and Nurse Practitioners) who all work together to provide you with the care you need, when you need it. ? ?We recommend signing up for the patient portal called "MyChart".  Sign up information is provided on this After Visit Summary.  MyChart is used to connect with patients for Virtual Visits (Telemedicine).  Patients are able to view lab/test results, encounter notes, upcoming appointments, etc.  Non-urgent messages can be sent to your provider as well.   ?To learn more about what you can do with MyChart, go to [email or web address].   ? ?Your next appointment:   ?3-6 month(s) ? ?The format for your next appointment:   ?In Person ? ?Provider:   ?Elouise Munroe, MD { ? ?Other Instructions ? ?Important Information About Sugar ? ? ? ? ?  ?

## 2022-04-25 ENCOUNTER — Other Ambulatory Visit: Payer: Self-pay | Admitting: Internal Medicine

## 2022-04-27 ENCOUNTER — Other Ambulatory Visit: Payer: Self-pay | Admitting: Internal Medicine

## 2022-05-11 ENCOUNTER — Other Ambulatory Visit: Payer: Self-pay | Admitting: Internal Medicine

## 2022-05-11 ENCOUNTER — Other Ambulatory Visit: Payer: BC Managed Care – PPO

## 2022-05-20 ENCOUNTER — Other Ambulatory Visit: Payer: BC Managed Care – PPO

## 2022-06-02 ENCOUNTER — Other Ambulatory Visit: Payer: Self-pay | Admitting: Internal Medicine

## 2022-08-03 ENCOUNTER — Other Ambulatory Visit: Payer: Self-pay | Admitting: Internal Medicine

## 2022-08-05 ENCOUNTER — Telehealth: Payer: Self-pay | Admitting: Internal Medicine

## 2022-08-05 MED ORDER — ATENOLOL 50 MG PO TABS: 50.0000 mg | ORAL_TABLET | Freq: Every day | ORAL | 1 refills | Status: DC

## 2022-08-05 NOTE — Telephone Encounter (Signed)
Pt c/o medication issue:  1. Name of Medication:   amLODipine (NORVASC) 2.5 MG tablet    2. How are you currently taking this medication (dosage and times per day)?   3. Are you having a reaction (difficulty breathing--STAT)?   4. What is your medication issue?  Pt states she usually takes this medication with atenolol but she is out of the atenolol right now until her PCP renews it. She wants to know if it is okay to take amlodopine by itself.

## 2022-08-05 NOTE — Telephone Encounter (Signed)
Per chart review:  Nurse visit 03/08/22 Patient here today for BP check. Patient reports taking Amlodipine 2.'5mg'$  QD instead of '5mg'$  QD. BP well-controlled on 2.'5mg'$ , per Audrey Bame, NP patient to remain on Amlodipine 2.'5mg'$  QD and Atenolol '50mg'$  QD. Patient to keep lab appointment 05/11/22 for Lipids, LFTs. Follow-up with Dr. Margaretann Fischer in 3-6 months.    Spoke to patient, she is out of atenolol and needs refill.  Advised we can send in refills.   Patient aware and verbalized understanding.  Pharmacy confirmed.

## 2022-08-10 ENCOUNTER — Ambulatory Visit: Payer: BC Managed Care – PPO | Admitting: Internal Medicine

## 2022-10-01 LAB — HM DIABETES EYE EXAM

## 2023-01-13 ENCOUNTER — Ambulatory Visit: Payer: BC Managed Care – PPO | Admitting: Internal Medicine

## 2023-01-28 ENCOUNTER — Telehealth: Payer: Self-pay | Admitting: Internal Medicine

## 2023-01-28 NOTE — Telephone Encounter (Signed)
*  STAT* If patient is at the pharmacy, call can be transferred to refill team.   1. Which medications need to be refilled? (please list name of each medication and dose if known)  rosuvastatin (CRESTOR) 40 MG tablet  2. Which pharmacy/location (including street and city if local pharmacy) is medication to be sent to? [id number]- [place], [place] - [address]  3. Do they need a 30 day or 90 day supply?  90 day supply

## 2023-02-03 ENCOUNTER — Other Ambulatory Visit: Payer: Self-pay

## 2023-02-03 MED ORDER — ROSUVASTATIN CALCIUM 40 MG PO TABS: 40.0000 mg | ORAL_TABLET | Freq: Every day | ORAL | 0 refills | Status: DC

## 2023-02-03 NOTE — Telephone Encounter (Signed)
Patient called again to get refill for rosuvastatin (CRESTOR) 40 MG tablet [facility] to [id number]- [place], [place] - [address] .  Patient notes she is completely out of this medication.

## 2023-02-03 NOTE — Telephone Encounter (Signed)
Was unable to reach patient left patient a voicemail to call back if there were any concerns. If Patient calls back just tell her that her atorvastatin was refilled and sent to the desired pharmacy.

## 2023-02-04 ENCOUNTER — Other Ambulatory Visit: Payer: Self-pay | Admitting: Nurse Practitioner

## 2023-03-17 ENCOUNTER — Ambulatory Visit: Payer: BC Managed Care – PPO | Admitting: Nurse Practitioner

## 2023-03-17 VITALS — BP 128/82 | HR 66 | Temp 97.7°F | Ht 68.0 in | Wt 195.0 lb

## 2023-03-17 DIAGNOSIS — R0981 Nasal congestion: Secondary | ICD-10-CM | POA: Diagnosis not present

## 2023-03-17 LAB — POCT RESPIRATORY SYNCYTIAL VIRUS: RSV Rapid Ag: NEGATIVE

## 2023-03-17 LAB — POC COVID19 BINAXNOW: SARS Coronavirus 2 Ag: NEGATIVE

## 2023-03-17 LAB — POCT INFLUENZA A/B
Influenza A, POC: NEGATIVE
Influenza B, POC: NEGATIVE

## 2023-03-17 NOTE — Assessment & Plan Note (Signed)
Acute, likely viral in origin.  Point-of-care COVID, flu, RSV all negative.  Recommend symptom management with over-the-counter decongestants/nasal sprays.  Offered prescription for promethazine dextromethorphan, patient declined for now.  Patient encouraged to notify office if symptoms persist or worsen at which point additional evaluation should be completed.

## 2023-03-17 NOTE — Progress Notes (Signed)
Established Patient Office Visit  Subjective   Patient ID: Audrey Fischer, female    DOB: [birth year]  Age: 67 y.o. MRN: [id number]  Chief Complaint  Patient presents with   Nasal Congestion    For the past 3 days, tried OTC meds (mucinex D)  which has not help. No chills or body aches    Patient arrives for acute visit for the above.  Symptom onset 3 days ago.  Experiencing cough, malaise and fatigue.  Also reported chills but not sure if she had an actual fever.  Does work in Teacher, [employer] as a [employer] school.  Has tried Mucinex DM without improvement in her symptoms.  Has not taken an at home COVID-19 test.     Review of Systems  Constitutional:  Positive for chills and malaise/fatigue. Negative for fever.  HENT:  Negative for sore throat.   Respiratory:  Positive for cough. Negative for sputum production, shortness of breath and wheezing.   Cardiovascular:  Negative for chest pain.  Gastrointestinal:  Negative for diarrhea, nausea and vomiting.  Musculoskeletal:  Negative for myalgias.  Neurological:  Negative for dizziness and headaches.      Objective:     BP 128/82   Pulse 66   Temp 97.7 F (36.5 C) (Oral)   Ht 5\' 8"  (1.727 m)   Wt 195 lb (88.5 kg)   SpO2 94%   BMI 29.65 kg/m    Physical Exam Vitals reviewed.  Constitutional:      General: She is not in acute distress.    Appearance: Normal appearance.  HENT:     Head: Normocephalic and atraumatic.  Neck:     Vascular: No carotid bruit.  Cardiovascular:     Rate and Rhythm: Normal rate and regular rhythm.     Pulses: Normal pulses.     Heart sounds: Normal heart sounds.  Pulmonary:     Effort: Pulmonary effort is normal.     Breath sounds: Normal breath sounds.  Skin:    General: Skin is warm and dry.  Neurological:     General: No focal deficit present.     Mental Status: She is alert and oriented to person, place, and time.  Psychiatric:        Mood and Affect: Mood normal.        Behavior: Behavior normal.         Judgment: Judgment normal.      Results for orders placed or performed in visit on 03/17/23  POC COVID-19  Result Value Ref Range   SARS Coronavirus 2 Ag Negative Negative  POCT Influenza A/B  Result Value Ref Range   Influenza A, POC Negative Negative   Influenza B, POC Negative Negative  POCT respiratory syncytial virus  Result Value Ref Range   RSV Rapid Ag negative       The ASCVD Risk score (Arnett DK, et al., 2019) failed to calculate for the following reasons:   The patient has a prior MI or stroke diagnosis    Assessment & Plan:   Problem List Items Addressed This Visit       Other   Nasal congestion - Primary    Acute, likely viral in origin.  Point-of-care COVID, flu, RSV all negative.  Recommend symptom management with over-the-counter decongestants/nasal sprays.  Offered prescription for promethazine dextromethorphan, patient declined for now.  Patient encouraged to notify office if symptoms persist or worsen at which point additional evaluation should be completed.       Relevant Orders  POC COVID-19 (Completed)   POCT Influenza A/B (Completed)   POCT respiratory syncytial virus (Completed)    Return if symptoms worsen or fail to improve.    Elenore Paddy, NP

## 2023-03-25 ENCOUNTER — Ambulatory Visit: Payer: BC Managed Care – PPO | Attending: Internal Medicine | Admitting: Internal Medicine

## 2023-03-25 ENCOUNTER — Other Ambulatory Visit: Payer: Self-pay | Admitting: Internal Medicine

## 2023-03-25 ENCOUNTER — Encounter: Payer: Self-pay | Admitting: Internal Medicine

## 2023-03-25 VITALS — BP 132/84 | HR 60 | Ht 68.0 in | Wt 198.2 lb

## 2023-03-25 DIAGNOSIS — Z79899 Other long term (current) drug therapy: Secondary | ICD-10-CM

## 2023-03-25 DIAGNOSIS — E785 Hyperlipidemia, unspecified: Secondary | ICD-10-CM

## 2023-03-25 DIAGNOSIS — I2510 Atherosclerotic heart disease of native coronary artery without angina pectoris: Secondary | ICD-10-CM

## 2023-03-25 DIAGNOSIS — I10 Essential (primary) hypertension: Secondary | ICD-10-CM

## 2023-03-25 DIAGNOSIS — I252 Old myocardial infarction: Secondary | ICD-10-CM | POA: Diagnosis not present

## 2023-03-25 DIAGNOSIS — Z8673 Personal history of transient ischemic attack (TIA), and cerebral infarction without residual deficits: Secondary | ICD-10-CM

## 2023-03-25 DIAGNOSIS — Z9861 Coronary angioplasty status: Secondary | ICD-10-CM

## 2023-03-25 MED ORDER — NITROGLYCERIN 0.4 MG SL SUBL: 0.4000 mg | SUBLINGUAL_TABLET | SUBLINGUAL | 3 refills | Status: AC | PRN

## 2023-03-25 NOTE — Progress Notes (Signed)
Cardiology Office Note:    Date:  03/25/2023  ID:  Audrey Fischer, DOB 14-Nov-1956, MRN [id number]  PCP:  Myrlene Broker, MD  Cardiologist:  Parke Poisson, MD  Electrophysiologist:  None   Referring MD: Myrlene Broker, *   Chief Complaint/Reason for Referral: NSTEMI, stroke  History of Present Illness:    Audrey Fischer is a 67 y.o. female with a history of NSTEMI in March 2009 with LAD and OM stents, hypertension, hyperlipidemia, and history of breast cancer on anastrozole.  She also has a history of stroke in Dec 2020. She suffered a stroke with symptoms starting on December 13 presenting with right hand numbness and tingling as well as gait disturbance.  MRI showed acute infarct in the posterior external capsule on the left.  She was recommended to take Plavix for 3 weeks in addition to indefinite aspirin therapy 81 mg daily.  Crestor was increased to 40 mg daily.  She was diagnosed with diabetes with an A1c of 9.7.  At her last visit 03/2021 she was excited for her son who was awarded a Runner, broadcasting/film/video at [employer]. She was feeling well, blood pressure remained well controlled, and she was tolerating her medications. She was working towards a goal of walking a mile in about 19 minutes. To help her lose weight she joined [place], a healthy lifestyle program that she describes as Arts development officer rations every couple hours. Was losing 1 pound per week. Lipids at goal.  In 11/2021 she reported chest pain that she felt could be related to anxiety. She planned to see her PCP.  She was seen by Eligha Bridegroom, NP 01/2022. Blood pressure well-controlled in the office. Had been elevated at home for 5 months.  After discussing multiple options she preferred to take a once daily medication that does not cause diuresis.  Creatinine 1.4 on 03/06/21.  BMP 01/2022 showed elevated kidney function. She was started on 5 mg amlodipine and continued atenolol 50 mg.   On 02/2022 she presented for BP check. She  was taking Amlodipine 2.5mg  QD instead of 5mg  QD. BP well-controlled on 2.5mg , per Eligha Bridegroom, NP patient to remain on Amlodipine 2.5mg  QD and Atenolol 50mg  QD.    Today, she states she is doing well. She notes that at the time of her visit with Eligha Bridegroom, NP she had been struggling at her work at a previous school and her blood pressure became labile with diastolics in the 100s-110s. 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  She has nitroglycerin at home but has not needed to use it yet.  In clinic today her blood pressure is 132/84, which she attributes to a component of white coat hypertension. She reports even lower readings at home.  Recently she developed a viral infection. She tested negative for Covid-19, RSV, and influenza. At this time she appears to have recovered well.  She denies any palpitations, chest pain, shortness of breath, or peripheral edema. No lightheadedness, headaches, syncope, orthopnea, or PND.   Past Medical History:  Diagnosis Date   Breast cancer (HCC) 07/09/15   left breast,  lower-inner quadrant    Coronary artery disease    cardiologist-  dr Bary Castilla [facility] physicians, [facility] cardiology in [facility])---  hx PCI w/ stents to OM and LAD 02-08-2008   Diabetes mellitus without complication [facility])    History of radiation therapy    completed 02-05-2016   Hyperlipidemia    Hypertension  PMB (postmenopausal bleeding)    S/P coronary artery stent placement    Stroke [facility])    10/2019    Past Surgical History:  Procedure Laterality Date   BREAST LUMPECTOMY  09/23/2015   CESAREAN SECTION  10-09-1996   COLONOSCOPY     CORONARY ANGIOPLASTY WITH STENT PLACEMENT  02-08-2008   dr Bary Castilla   PCI and stent to OM and LAD   DILATATION & CURRETTAGE/HYSTEROSCOPY WITH RESECTOCOPE N/A 08/15/2015   Procedure: DILATATION & CURETTAGE/HYSTEROSCOPY ;  Surgeon: Hal Morales, MD;  Location: [facility] [place];  Service:  Gynecology;  Laterality: N/A;   DILATION AND CURETTAGE OF UTERUS  1994   blighted ovum    Current Medications: No outpatient medications have been marked as taking for the 03/25/23 encounter (Office Visit) with Parke Poisson, MD.    Outpatient Encounter Medications as of 03/25/2023  Medication Sig   amLODipine (NORVASC) 2.5 MG tablet Take 1 tablet (2.5 mg total) by mouth daily.   anastrozole (ARIMIDEX) 1 MG tablet Take 1 tablet (1 mg total) by mouth daily.   Ascorbic Acid (VITAMIN C GUMMIE PO) Take 2,000 mg by mouth daily.   aspirin 81 MG tablet Take 81 mg by mouth daily.   atenolol (TENORMIN) 50 MG tablet TAKE 1 TABLET BY MOUTH EVERY DAY   Cholecalciferol (VITAMIN D3) 2000 UNITS TABS Take 1 tablet by mouth daily.   nitroGLYCERIN (NITROSTAT) 0.4 MG SL tablet Place 1 tablet (0.4 mg total) under the tongue every 5 (five) minutes as needed for chest pain.   rosuvastatin (CRESTOR) 40 MG tablet Take 1 tablet (40 mg total) by mouth daily.   vitamin B-12 (CYANOCOBALAMIN) 500 MCG tablet Take 1 tablet (500 mcg total) by mouth daily.   [DISCONTINUED] nitroGLYCERIN (NITROSTAT) 0.4 MG SL tablet Place 0.4 mg under the tongue every 5 (five) minutes as needed for chest pain.   No facility-administered encounter medications on file as of 03/25/2023.     Allergies:   Capsule [gelatin] and Capsaicin   Social History   Tobacco Use   Smoking status: Never   Smokeless tobacco: Never  Vaping Use   Vaping Use: Unknown  Substance Use Topics   Alcohol use: Yes    Alcohol/week: 0.0 standard drinks of alcohol    Comment: occasionally   Drug use: No     Family History: The patient's family history includes Colon cancer (age of onset: 81) in her mother; Colon polyps in her father; Heart disease in her father and mother; Stomach cancer in her maternal aunt; Stroke in her mother. There is no history of Esophageal cancer or Rectal cancer.  ROS:   Please see the history of present illness.    All  other systems reviewed and are negative.  EKGs/Labs/Other Studies Reviewed:    The following studies were reviewed today:  Echo 11/13/2019: 1. Left ventricular ejection fraction, by visual estimation, is 60 to  65%. The left ventricle has normal function. There is no left ventricular  hypertrophy.   2. Elevated left atrial pressure.   3. Left ventricular diastolic parameters are consistent with Grade II  diastolic dysfunction (pseudonormalization).   4. The left ventricle has no regional wall motion abnormalities.   5. Global right ventricle has normal systolic function.The right  ventricular size is normal. No increase in right ventricular wall  thickness.   6. Left atrial size was normal.   7. Right atrial size was normal.   8. The mitral valve is normal in structure. Mild  mitral valve  regurgitation. No evidence of mitral stenosis.   9. The tricuspid valve is normal in structure. Tricuspid valve  regurgitation is mild.  10. The aortic valve is normal in structure. Aortic valve regurgitation is  not visualized. No evidence of aortic valve sclerosis or stenosis.  11. The pulmonic valve was normal in structure. Pulmonic valve  regurgitation is not visualized.  12. Moderately elevated pulmonary artery systolic pressure.  13. The inferior vena cava is normal in size with greater than 50%  respiratory variability, suggesting right atrial pressure of 3 mmHg.  CT Angio Head/Neck 11/13/2019: IMPRESSION: Evolving infarction of the left basal ganglia and adjacent white matter. No acute intracranial hemorrhage.   No large vessel occlusion. No hemodynamically significant stenosis in the neck. Multifocal intracranial atherosclerosis.   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 Postcontrast MR imaging through the orbits is recommended.  EKG:  EKG is personally reviewed. 03/25/2023:  Sinus rhythm.  LVH. Nonspecific ST-T wave abnormality. 04/07/2021: Sinus  bradycardia, rate 59 bpm, LVH 09/18/2020: Sinus bradycardia, LVH  Recent Labs: No results found for requested labs within last 365 days.   Recent Lipid Panel    Component Value Date/Time   CHOL 160 02/08/2022 1443   TRIG 80 02/08/2022 1443   HDL 68 02/08/2022 1443   CHOLHDL 2.4 02/08/2022 1443   CHOLHDL 3 03/16/2021 1046   VLDL 29.4 03/16/2021 1046   LDLCALC 77 02/08/2022 1443    Physical Exam:    VS:  BP 132/84   Pulse 60   Ht 5\' 8"  (1.727 m)   Wt 198 lb 3.2 oz (89.9 kg)   SpO2 97%   BMI 30.14 kg/m     Wt Readings from Last 5 Encounters:  03/25/23 198 lb 3.2 oz (89.9 kg)  03/17/23 195 lb (88.5 kg)  03/08/22 173 lb (78.5 kg)  02/08/22 178 lb (80.7 kg)  04/07/21 180 lb 3.2 oz (81.7 kg)    Constitutional: No acute distress Eyes: sclera non-icteric, normal conjunctiva and lids ENMT: normal dentition, moist mucous membranes Cardiovascular: regular rhythm, normal rate, no murmurs. S1 and S2 normal. Radial pulses normal bilaterally. No jugular venous distention.  Respiratory: clear to auscultation bilaterally GI : normal bowel sounds, soft and nontender. No distention.   MSK: extremities warm, well perfused. No edema.  NEURO: grossly nonfocal exam, moves all extremities. PSYCH: alert and oriented x 3, normal mood and affect.   ASSESSMENT:    1. Coronary artery disease involving native coronary artery of native heart without angina pectoris   2. Hyperlipidemia LDL goal <70   3. Essential hypertension   4. History of MI (myocardial infarction)   5. History of percutaneous coronary intervention   6. History of stroke   7. Medication management     PLAN:    Coronary artery disease involving native coronary artery of native heart without angina pectoris Non-ST elevation (NSTEMI) myocardial infarction (HCC) - Plan: EKG 12-Lead History of percutaneous coronary intervention - ASA 81 mg indefinitely - continue atenolol 50 mg daily, Crestor 40 mg daily - no chest pain  or DOE. -Refill nitroglycerin today  Hypertension, unspecified type - stable, no changes -Continue amlodipine 2.5 mg daily and atenolol 50 mg daily  HLD - continue crestor, lipids at goal.  Next lipid check with her PCP at annual follow-up.  Follow-up:  1 year.  Total time of encounter: 25 minutes total time of encounter, including 15 minutes spent in face-to-face patient care on the date of this encounter.  This time includes coordination of care and counseling regarding above mentioned problem list. Remainder of non-face-to-face time involved reviewing chart documents/testing relevant to the patient encounter and documentation in the medical record. I have independently reviewed documentation from referring provider.   Weston Brass, MD, [facility]   [facility]   Medication Adjustments/Labs and Tests Ordered: Current medicines are reviewed at length with the patient today.  Concerns regarding medicines are outlined above.   Orders Placed This Encounter  Procedures   EKG 12-Lead   Meds ordered this encounter  Medications   nitroGLYCERIN (NITROSTAT) 0.4 MG SL tablet    Sig: Place 1 tablet (0.4 mg total) under the tongue every 5 (five) minutes as needed for chest pain.    Dispense:  25 tablet    Refill:  3   Patient Instructions  Medication Instructions:  No Changes In Medications at this time.   *If you need a refill on your cardiac medications before your next appointment, please call your pharmacy*  Follow-Up: At [facility], you and your health needs are our priority.  As part of our continuing mission to provide you with exceptional heart care, we have created designated Provider Care Teams.  These Care Teams include your primary Cardiologist (physician) and Advanced Practice Providers (APPs -  Physician Assistants and Nurse Practitioners) who all work together to provide you with the care you need, when you need it.  Your next appointment:   1  year(s)  Provider:   Parke Poisson, MD       [facility],acting as a scribe for Parke Poisson, MD.,have documented all relevant documentation on the behalf of Parke Poisson, MD,as directed by  Parke Poisson, MD while in the presence of Parke Poisson, MD.  I, Parke Poisson, MD, have reviewed all documentation for this visit. The documentation on 03/25/23 for the exam, diagnosis, procedures, and orders are all accurate and complete.

## 2023-03-25 NOTE — Patient Instructions (Signed)
Medication Instructions:  No Changes In Medications at this time.  *If you need a refill on your cardiac medications before your next appointment, please call your pharmacy*  Follow-Up: At [facility], you and your health needs are our priority.  As part of our continuing mission to provide you with exceptional heart care, we have created designated Provider Care Teams.  These Care Teams include your primary Cardiologist (physician) and Advanced Practice Providers (APPs -  Physician Assistants and Nurse Practitioners) who all work together to provide you with the care you need, when you need it.  Your next appointment:   1 year(s)  Provider:   Gayatri A Acharya, MD    

## 2023-03-28 ENCOUNTER — Telehealth: Payer: Self-pay | Admitting: Internal Medicine

## 2023-03-28 NOTE — Telephone Encounter (Signed)
*  STAT* If patient is at the pharmacy, call can be transferred to refill team.   1. Which medications need to be refilled? (please list name of each medication and dose if known) atenolol (TENORMIN) 50 MG tablet   2. Which pharmacy/location (including street and city if local pharmacy) is medication to be sent to?  [id number] - [place], [place] - [address]    3. Do they need a 30 day or 90 day supply? 90

## 2023-03-29 ENCOUNTER — Other Ambulatory Visit: Payer: Self-pay

## 2023-03-29 MED ORDER — ATENOLOL 50 MG PO TABS: 50.0000 mg | ORAL_TABLET | Freq: Every day | ORAL | 0 refills | Status: DC

## 2023-03-29 NOTE — Telephone Encounter (Signed)
Called patietn to advise medication refill sent to pharmacy.

## 2023-04-01 ENCOUNTER — Other Ambulatory Visit: Payer: Self-pay | Admitting: Nurse Practitioner

## 2023-05-27 ENCOUNTER — Telehealth: Payer: Self-pay | Admitting: Internal Medicine

## 2023-05-27 MED ORDER — ROSUVASTATIN CALCIUM 40 MG PO TABS: 40.0000 mg | ORAL_TABLET | Freq: Every day | ORAL | 2 refills | Status: DC

## 2023-05-27 NOTE — Telephone Encounter (Signed)
*  STAT* If patient is at the pharmacy, call can be transferred to refill team.   1. Which medications need to be refilled? (please list name of each medication and dose if known) rosuvastatin (CRESTOR) 40 MG tablet   2. Which pharmacy/location (including street and city if local pharmacy) is medication to be sent to?  [id number] - [place], [place] - [address]     3. Do they need a 30 day or 90 day supply? 90

## 2023-05-27 NOTE — Telephone Encounter (Signed)
Pt's medication was sent to pt's pharmacy as requested. Confirmation received.  °

## 2023-09-29 ENCOUNTER — Other Ambulatory Visit: Payer: Self-pay | Admitting: Internal Medicine

## 2023-11-15 LAB — HM DIABETES EYE EXAM

## 2023-11-28 ENCOUNTER — Telehealth: Payer: Self-pay | Admitting: Internal Medicine

## 2023-11-28 MED ORDER — ATENOLOL 50 MG PO TABS: 50.0000 mg | ORAL_TABLET | Freq: Every day | ORAL | 0 refills | Status: DC

## 2023-11-28 NOTE — Telephone Encounter (Signed)
*  STAT* If patient is at the pharmacy, call can be transferred to refill team.   1. Which medications need to be refilled? (please list name of each medication and dose if known) atenolol (TENORMIN) 50 MG tablet   2. Which pharmacy/location (including street and city if local pharmacy) is medication to be sent to? [id number] [place], [place] - [address] [facility]   3. Do they need a 30 day or 90 day supply? 90

## 2023-11-28 NOTE — Telephone Encounter (Signed)
Pt's medication was sent to pt's pharmacy as requested. Confirmation received.  °

## 2023-12-12 ENCOUNTER — Other Ambulatory Visit: Payer: Self-pay | Admitting: Internal Medicine

## 2024-01-09 ENCOUNTER — Ambulatory Visit (INDEPENDENT_AMBULATORY_CARE_PROVIDER_SITE_OTHER): Payer: 59

## 2024-01-09 DIAGNOSIS — Z23 Encounter for immunization: Secondary | ICD-10-CM

## 2024-01-09 NOTE — Progress Notes (Signed)
Patient visits today to  receive her High Dose flu injection/vaccine. Patient was informed and tolerated well. Patient was notified to reach out to [place] if needed.

## 2024-02-07 ENCOUNTER — Ambulatory Visit: Payer: Self-pay | Admitting: Internal Medicine

## 2024-02-07 ENCOUNTER — Encounter: Payer: Self-pay | Admitting: Internal Medicine

## 2024-02-07 VITALS — BP 124/68 | HR 76 | Temp 97.7°F | Ht 68.0 in | Wt 196.0 lb

## 2024-02-07 DIAGNOSIS — Z8673 Personal history of transient ischemic attack (TIA), and cerebral infarction without residual deficits: Secondary | ICD-10-CM

## 2024-02-07 DIAGNOSIS — E118 Type 2 diabetes mellitus with unspecified complications: Secondary | ICD-10-CM | POA: Diagnosis not present

## 2024-02-07 DIAGNOSIS — Z7984 Long term (current) use of oral hypoglycemic drugs: Secondary | ICD-10-CM | POA: Diagnosis not present

## 2024-02-07 DIAGNOSIS — Z0000 Encounter for general adult medical examination without abnormal findings: Secondary | ICD-10-CM

## 2024-02-07 DIAGNOSIS — N179 Acute kidney failure, unspecified: Secondary | ICD-10-CM

## 2024-02-07 DIAGNOSIS — I10 Essential (primary) hypertension: Secondary | ICD-10-CM

## 2024-02-07 DIAGNOSIS — E1169 Type 2 diabetes mellitus with other specified complication: Secondary | ICD-10-CM

## 2024-02-07 DIAGNOSIS — E785 Hyperlipidemia, unspecified: Secondary | ICD-10-CM

## 2024-02-07 LAB — HEMOGLOBIN A1C: Hemoglobin A1C: 7.3

## 2024-02-07 NOTE — Progress Notes (Unsigned)
   Subjective:   Patient ID: Audrey Fischer, female    DOB: 1956-11-15, 68 y.o.   MRN: [id number]  HPI The patient is here for physical.  PMH, [facility], social history reviewed and updated  Review of Systems  Constitutional: Negative.   HENT: Negative.    Eyes: Negative.   Respiratory:  Negative for cough, chest tightness and shortness of breath.   Cardiovascular:  Negative for chest pain, palpitations and leg swelling.  Gastrointestinal:  Negative for abdominal distention, abdominal pain, constipation, diarrhea, nausea and vomiting.  Musculoskeletal: Negative.   Skin: Negative.   Neurological: Negative.   Psychiatric/Behavioral: Negative.      Objective:  Physical Exam Constitutional:      Appearance: She is well-developed.  HENT:     Head: Normocephalic and atraumatic.  Cardiovascular:     Rate and Rhythm: Normal rate and regular rhythm.  Pulmonary:     Effort: Pulmonary effort is normal. No respiratory distress.     Breath sounds: Normal breath sounds. No wheezing or rales.  Abdominal:     General: Bowel sounds are normal. There is no distension.     Palpations: Abdomen is soft.     Tenderness: There is no abdominal tenderness. There is no rebound.  Musculoskeletal:     Cervical back: Normal range of motion.  Skin:    General: Skin is warm and dry.  Neurological:     Mental Status: She is alert and oriented to person, place, and time.     Coordination: Coordination normal.     Vitals:   02/07/24 1601  BP: 124/68  Pulse: 76  Temp: 97.7 F (36.5 C)  TempSrc: Oral  SpO2: 94%  Weight: 196 lb (88.9 kg)  Height: 5\' 8"  (1.727 m)    Assessment & Plan:

## 2024-02-08 ENCOUNTER — Telehealth: Payer: Self-pay

## 2024-02-08 LAB — MICROALBUMIN / CREATININE URINE RATIO
Creatinine,U: 189.6 mg/dL
Microalb Creat Ratio: 171.3 mg/g — ABNORMAL HIGH (ref 0.0–30.0)
Microalb, Ur: 32.5 mg/dL — ABNORMAL HIGH (ref 0.0–1.9)

## 2024-02-08 NOTE — Telephone Encounter (Signed)
 Called patient and left a very detailed message letting her know that if we are needing to send her over to [facility] we can so we can make sure we have her blood drawn

## 2024-02-10 DIAGNOSIS — N179 Acute kidney failure, unspecified: Secondary | ICD-10-CM | POA: Insufficient documentation

## 2024-02-10 NOTE — Assessment & Plan Note (Signed)
 Flu shot declines. Pneumonia declines. Shingrix declines. Tetanus up to date. Colonoscopy upto date. Mammogram with gyn, pap smear with gyn and dexa with gyn. Counseled about sun safety and mole surveillance. Counseled about the dangers of distracted driving. Given 10 year screening recommendations.

## 2024-02-10 NOTE — Assessment & Plan Note (Signed)
 She was not told about this at ER visit Jan 2025. Review showed creatinine around 2 (baseline 1.3-1.5). Checking CMP and adjust as needed.

## 2024-02-10 NOTE — Assessment & Plan Note (Signed)
 Recent lipid panel stable will continue crestor.

## 2024-02-10 NOTE — Assessment & Plan Note (Signed)
 Getting HgA1c with endo but due for microalbumin to creatinine ratio which is ordered and CMP. Taking metformin 500 mg daily and on statin. Not on ACE-I/ARB

## 2024-02-10 NOTE — Assessment & Plan Note (Signed)
 Recent labs at the ER with AKI not addressed or treated. She was unaware and not rechecked. BP at goal on atenolol 50 mg daily and amlodipine 2.5 mg daily. Checking CMP.

## 2024-02-10 NOTE — Assessment & Plan Note (Signed)
 No new stroke symptoms. HgA1c and BP at goal on statin and taking aspirin 81 mg daily. Continue.

## 2024-02-16 ENCOUNTER — Encounter: Payer: Self-pay | Admitting: Internal Medicine

## 2024-02-23 ENCOUNTER — Other Ambulatory Visit: Payer: Self-pay | Admitting: Internal Medicine

## 2024-02-27 ENCOUNTER — Other Ambulatory Visit

## 2024-03-21 ENCOUNTER — Ambulatory Visit: Admitting: Family Medicine

## 2024-03-21 ENCOUNTER — Encounter: Payer: Self-pay | Admitting: Family Medicine

## 2024-03-21 VITALS — BP 140/90 | HR 84 | Temp 98.5°F | Ht 68.0 in | Wt 190.8 lb

## 2024-03-21 DIAGNOSIS — B9689 Other specified bacterial agents as the cause of diseases classified elsewhere: Secondary | ICD-10-CM

## 2024-03-21 DIAGNOSIS — R051 Acute cough: Secondary | ICD-10-CM | POA: Diagnosis not present

## 2024-03-21 DIAGNOSIS — J069 Acute upper respiratory infection, unspecified: Secondary | ICD-10-CM

## 2024-03-21 DIAGNOSIS — R0981 Nasal congestion: Secondary | ICD-10-CM | POA: Diagnosis not present

## 2024-03-21 MED ORDER — HYDROCODONE BIT-HOMATROP MBR 5-1.5 MG/5ML PO SOLN: 5.0000 mL | Freq: Four times a day (QID) | ORAL | 0 refills | Status: DC | PRN

## 2024-03-21 MED ORDER — FLUTICASONE PROPIONATE 50 MCG/ACT NA SUSP: 2.0000 | Freq: Every day | NASAL | 6 refills | Status: AC

## 2024-03-21 MED ORDER — AZITHROMYCIN 250 MG PO TABS: ORAL_TABLET | ORAL | 0 refills | Status: AC

## 2024-03-21 NOTE — Progress Notes (Signed)
 Acute Office Visit  Subjective:     Patient ID: Audrey Fischer, female    DOB: September 23, 1956, 68 y.o.   MRN: [id number]  Chief Complaint  Patient presents with   Acute Visit    Cough, chest congestion. Ongoing since Sunday, started as a head cold denies SOB and chest pains. Cough mostly dry, when productive yellow mucus. Has tried Mucinex D    HPI Patient is in today for evaluation of cough, chest congestion, for the last 4-5 days. States that this started as a head cold last week and has progressed over the last 5 days. Cough is worse at night. Has tried mucinex with mild temporary relief. Unsure of sick contacts, she is a Runner, broadcasting/film/video. Denies abdominal pain, nausea, vomiting, diarrhea, rash, fever, chills, other symptoms.  Medical hx as outlined below.  ROS Per HPI      Objective:    BP (!) 140/90 (BP Location: Left Arm, Patient Position: Sitting)   Pulse 84   Temp 98.5 F (36.9 C) (Temporal)   Ht 5\' 8"  (1.727 m)   Wt 190 lb 12.8 oz (86.5 kg)   SpO2 98%   BMI 29.01 kg/m    Physical Exam Vitals and nursing note reviewed.  Constitutional:      General: She is not in acute distress.    Comments: Appears fatigued, ill appearing  HENT:     Head: Normocephalic and atraumatic.     Right Ear: External ear normal.     Left Ear: External ear normal.     Nose: Congestion present.     Mouth/Throat:     Mouth: Mucous membranes are moist.     Pharynx: Oropharynx is clear. No oropharyngeal exudate or posterior oropharyngeal erythema.     Comments: Oropharyngeal cobblestoning   Eyes:     Extraocular Movements: Extraocular movements intact.  Cardiovascular:     Rate and Rhythm: Normal rate and regular rhythm.     Heart sounds: Normal heart sounds.  Pulmonary:     Effort: Pulmonary effort is normal. No respiratory distress.     Breath sounds: No wheezing, rhonchi or rales.     Comments: Persistent cough Musculoskeletal:     Cervical back: Normal range of motion and neck  supple.  Lymphadenopathy:     Cervical: Cervical adenopathy present.  Skin:    General: Skin is warm and dry.  Neurological:     General: No focal deficit present.     Mental Status: She is alert and oriented to person, place, and time.    No results found for any visits on 03/21/24.      Assessment & Plan:   Bacterial URI -     Azithromycin ; Take 2 tablets on day 1, then 1 tablet daily on days 2 through 5  Dispense: 6 tablet; Refill: 0  Acute cough -     HYDROcodone  Bit-Homatrop MBr; Take 5 mLs by mouth every 6 (six) hours as needed for cough.  Dispense: 120 mL; Refill: 0  Nasal congestion -     Fluticasone  Propionate; Place 2 sprays into both nostrils daily.  Dispense: 16 g; Refill: 6     Meds ordered this encounter  Medications   azithromycin  (ZITHROMAX ) 250 MG tablet    Sig: Take 2 tablets on day 1, then 1 tablet daily on days 2 through 5    Dispense:  6 tablet    Refill:  0   HYDROcodone  bit-homatropine (HYDROMET) 5-1.5 MG/5ML syrup    Sig: Take  5 mLs by mouth every 6 (six) hours as needed for cough.    Dispense:  120 mL    Refill:  0   fluticasone  (FLONASE ) 50 MCG/ACT nasal spray    Sig: Place 2 sprays into both nostrils daily.    Dispense:  16 g    Refill:  6    Return if symptoms worsen or fail to improve.  Wellington Half, FNP

## 2024-03-21 NOTE — Patient Instructions (Signed)
 I have sent in azithromycin  for you to take.  Take 2 tablets today, then 1 tablet daily for the next 4 days.  I have sent in hydrocodone  cough syrup for you to take 5 mL once daily in the evening as needed for cough.  This medication may make you sleepy.  Do not drive or operate heavy machinery while taking this medication.  I have sent in flonase  for you to use 2 sprays per nostril once daily.   Follow-up with me for new or worsening symptoms.

## 2024-03-26 ENCOUNTER — Telehealth: Payer: Self-pay | Admitting: Internal Medicine

## 2024-03-26 MED ORDER — ATENOLOL 50 MG PO TABS: 50.0000 mg | ORAL_TABLET | Freq: Every day | ORAL | 0 refills | Status: DC

## 2024-03-26 NOTE — Telephone Encounter (Signed)
 Pt's medication was sent to pt's pharmacy as requested. Confirmation received.

## 2024-03-26 NOTE — Telephone Encounter (Signed)
*  STAT* If patient is at the pharmacy, call can be transferred to refill team.   1. Which medications need to be refilled? (please list name of each medication and dose if known) atenolol (TENORMIN) 50 MG tablet   2. Which pharmacy/location (including street and city if local pharmacy) is medication to be sent to?  [id number] - [place], [place] - [address]    3. Do they need a 30 day or 90 day supply? 90

## 2024-03-28 ENCOUNTER — Ambulatory Visit: Payer: BC Managed Care – PPO | Admitting: Internal Medicine

## 2024-05-04 ENCOUNTER — Telehealth: Payer: Self-pay | Admitting: Internal Medicine

## 2024-05-04 MED ORDER — ATENOLOL 50 MG PO TABS: 50.0000 mg | ORAL_TABLET | Freq: Every day | ORAL | 0 refills | Status: DC

## 2024-05-04 NOTE — Telephone Encounter (Signed)
 RX sent to requested Pharmacy

## 2024-05-04 NOTE — Telephone Encounter (Signed)
*  STAT* If patient is at the pharmacy, call can be transferred to refill team.   1. Which medications need to be refilled? (please list name of each medication and dose if known) atenolol  (TENORMIN ) 50 MG tablet    2. Would you like to learn more about the convenience, safety, & potential cost savings by using the [facility]?    3. Are you open to using the [facility] (Type [facility].  ).   4. Which pharmacy/location (including street and city if local pharmacy) is medication to be sent to?  [id number] - [place], [place] - [address]     5. Do they need a 30 day or 90 day supply? 30 day

## 2024-05-23 ENCOUNTER — Telehealth: Payer: Self-pay | Admitting: Internal Medicine

## 2024-05-23 MED ORDER — ROSUVASTATIN CALCIUM 40 MG PO TABS: 40.0000 mg | ORAL_TABLET | Freq: Every day | ORAL | 0 refills | Status: DC

## 2024-05-23 NOTE — Telephone Encounter (Signed)
*  STAT* If patient is at the pharmacy, call can be transferred to refill team.   1. Which medications need to be refilled? (please list name of each medication and dose if known) rosuvastatin (CRESTOR) 40 MG tablet   2. Which pharmacy/location (including street and city if local pharmacy) is medication to be sent to?  [id number] - [place], [place] - [address]     3. Do they need a 30 day or 90 day supply? 90

## 2024-05-28 ENCOUNTER — Other Ambulatory Visit: Payer: Self-pay | Admitting: Internal Medicine

## 2024-05-29 NOTE — Progress Notes (Unsigned)
 Cardiology Office Note    Date:  05/30/2024  ID:  Audrey Fischer, DOB [birth year], MRN [id number] PCP:  Rollene Almarie LABOR, MD  Cardiologist:  Soyla LABOR Merck, MD  Electrophysiologist:  None   Chief Complaint: f/u CAD  History of Present Illness: .    Audrey Fischer is a 68 y.o. female with visit-pertinent history of CAD with NSTEMI in March 2009 with LAD and OM stents at 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 We do not have the prior cath report in our system, done at outside facility. She transferred care here in 2020. Last echo 10/2019 in setting of stroke showed EF 60-65%, G2DD, mild MR/TR, moderately elevated PASP. At time of stroke neurology recommended to take Plavix  for 3 weeks in addition to indefinite aspirin  therapy 81 mg daily. 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 She has since followed with endocrinology for her DM and PCP for HLD. She saw her PCP in 03/2024 who took her off metformin  due to renal dysfunction and referred her to nephrology. This is pending for September. The patient recalls being told amlodipine  may not be good for kidneys so she also self discontinued this by weaning down to every other day than off. I cannot find documentation of this in CareEverywhere. She otherwise reports she's doing great without any CP, SOB, palpitations, edema, or any cardiac concerns.  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 She subsequently transferred to [facility] with a better experience.  Labwork independently reviewed: 03/2024 K 4.3, Cr 1.64 11/2023 Hgb 13.8, plt ok, Cr 2 08/2023 PCP LDL 73, trig 123  ROS: .    Please see the history of present illness. All  other systems are reviewed and otherwise negative.  Studies Reviewed: SABRA    EKG:  EKG is ordered today, personally reviewed, demonstrating  EKG Interpretation Date/Time:  Wednesday May 30 2024 15:21:40 EDT Ventricular Rate:  60 PR Interval:  136 QRS Duration:  72 QT Interval:  372 QTC Calculation: 372 R Axis:   -2  Text Interpretation: Normal sinus rhythm Upsloped ST segments I, avL and nonspecific STTW changes otherwise Stable from prior Confirmed by Ryelee Albee [phone] on 05/30/2024 3:40:52 PM    CV Studies: Cardiac studies reviewed are outlined and summarized above. Otherwise please see EMR for full report.   Current Reported Medications:.    Current Meds  Medication Sig   anastrozole  (ARIMIDEX ) 1 MG tablet Take 1 tablet (1 mg total) by mouth daily.   Ascorbic Acid (VITAMIN C GUMMIE PO) Take 2,000 mg by mouth daily.   aspirin  81 MG tablet Take 81 mg by mouth daily.   atenolol  (TENORMIN ) 50 MG tablet Take 1 tablet (50 mg total) by mouth daily.   Cholecalciferol  (VITAMIN D3) 2000 UNITS TABS Take 1 tablet by mouth daily.   fluticasone  (FLONASE ) 50 MCG/ACT nasal spray Place 2 sprays into both nostrils daily.   HYDROcodone  bit-homatropine (HYDROMET) 5-1.5 MG/5ML syrup Take 5 mLs by mouth every 6 (six) hours as needed for cough.   nitroGLYCERIN  (NITROSTAT ) 0.4 MG SL tablet Place 1 tablet (0.4 mg total) under the tongue every 5 (five) minutes as needed for chest pain.   rosuvastatin  (CRESTOR ) 40 MG tablet Take 1 tablet (40 mg total)  by mouth daily.   vitamin B-12 (CYANOCOBALAMIN ) 500 MCG tablet Take 1 tablet (500 mcg total) by mouth daily.    Physical Exam:    VS:  BP (!) 146/76   Pulse 60   Ht 5' 8 (1.727 m)   Wt 198 lb 3.2 oz (89.9 kg)   SpO2 98%   BMI 30.14 kg/m    Wt Readings from Last 3 Encounters:  05/30/24 198 lb 3.2 oz (89.9 kg)  03/21/24 190 lb 12.8 oz (86.5 kg)  02/07/24 196 lb (88.9 kg)    GEN: Well nourished, well developed in no acute distress NECK: No JVD;  No carotid bruits CARDIAC: RRR, no murmurs, rubs, gallops RESPIRATORY:  Clear to auscultation without rales, wheezing or rhonchi  ABDOMEN: Soft, non-tender, non-distended EXTREMITIES:  No edema; No acute deformity   Asessement and Plan:.    1. CAD, HLD - doing well without interim angina. EKG appears stable compared to prior. Continue ASA 81mg  daily, atenolol  50mg  daily, rosuvastatin  40mg  daily. She was inherited to Dr. Loni on atenolol . If she has future issues with deteriorating renal function on recheck labs, can consider transition to carvedilol. (Max dose of atenolol  with CrCl 15-35 is 50mg /day - her CrCl is 91ml/min by last check so current dose is OK regardless.) Her last LDL was not at goal in 08/2023. Discussed rechecking at time when she returns for echo. Will get CMET/lipids at that time.  2. Essential HTN - BP suboptimally controlled at 146/76, recheck by me right at 130/80. See above re: amlodipine . She states she was told amlodipine  might be bad for kidneys but this is typically a kidney-neutral medication. Advised she monitor BP at home for 1 week and relay readings for our review. If she requires re-addition of amlodipine , would recommend to have staff touch base with her endocrinologist's office to ensure there isn't a different concern they have with continuation of this medication. Endocrinology felt she may eventually benefit from ARB but recommended to establish with nephrology first.  3. Mild MR, TR, moderate pHTN by echo 2020 - due for recheck echo, will arrange.     Disposition: F/u with Dr. Loni in 1 year.  Signed, Makenly Larabee N Tahtiana Rozier, PA-C

## 2024-05-30 ENCOUNTER — Encounter: Payer: Self-pay | Admitting: Physician Assistant

## 2024-05-30 ENCOUNTER — Ambulatory Visit: Payer: Self-pay | Attending: Cardiology | Admitting: Physician Assistant

## 2024-05-30 VITALS — BP 130/80 | HR 60 | Ht 68.0 in | Wt 198.2 lb

## 2024-05-30 DIAGNOSIS — I272 Pulmonary hypertension, unspecified: Secondary | ICD-10-CM

## 2024-05-30 DIAGNOSIS — E785 Hyperlipidemia, unspecified: Secondary | ICD-10-CM | POA: Diagnosis not present

## 2024-05-30 DIAGNOSIS — I10 Essential (primary) hypertension: Secondary | ICD-10-CM | POA: Diagnosis not present

## 2024-05-30 DIAGNOSIS — I340 Nonrheumatic mitral (valve) insufficiency: Secondary | ICD-10-CM

## 2024-05-30 DIAGNOSIS — I2510 Atherosclerotic heart disease of native coronary artery without angina pectoris: Secondary | ICD-10-CM | POA: Diagnosis not present

## 2024-05-30 DIAGNOSIS — I071 Rheumatic tricuspid insufficiency: Secondary | ICD-10-CM

## 2024-05-30 NOTE — Patient Instructions (Signed)
 Medication Instructions:   Your physician recommends that you continue on your current medications as directed. Please refer to the Current Medication list given to you today.   *If you need a refill on your cardiac medications before your next appointment, please call your pharmacy*    Lab Work:  PLEASE GO DOWN STAIRS  [facility]  FIRST FLOOR   ( GET OFF ELEVATORS WALK TOWARDS WAITING AREA LAB LOCATED BY PHARMACY):  ON SAME DAY AS ECHOCARDIOGRAM  RETURN FASTING  CMET LIPIDS AND TSH TODAY    If you have labs (blood work) drawn today and your tests are completely normal, you will receive your results only by: MyChart Message (if you have MyChart) OR A paper copy in the mail If you have any lab test that is abnormal or we need to change your treatment, we will call you to review the results.   Testing/Procedures: Your physician has requested that you have an echocardiogram. Echocardiography is a painless test that uses sound waves to create images of your heart. It provides your doctor with information about the size and shape of your heart and how well your heart's chambers and valves are working. This procedure takes approximately one hour. There are no restrictions for this procedure. Please do NOT wear cologne, perfume, aftershave, or lotions (deodorant is allowed). Please arrive 15 minutes prior to your appointment time.  Please note: We ask at that you not bring children with you during ultrasound (echo/ vascular) testing. Due to room size and safety concerns, children are not allowed in the ultrasound rooms during exams. Our front office staff cannot provide observation of children in our lobby area while testing is being conducted. An adult accompanying a patient to their appointment will only be allowed in the ultrasound room at the discretion of the ultrasound technician under special circumstances. We apologize for any inconvenience.     Follow-Up:  At [facility], you  and your health needs are our priority.  As part of our continuing mission to provide you with exceptional heart care, our providers are all part of one team.  This team includes your primary Cardiologist (physician) and Advanced Practice Providers or APPs (Physician Assistants and Nurse Practitioners) who all work together to provide you with the care you need, when you need it.  Your next appointment:    1 year(s)   Provider:    Soyla DELENA Merck, MD    We recommend signing up for the patient portal called MyChart.  Sign up information is provided on this After Visit Summary.  MyChart is used to connect with patients for Virtual Visits (Telemedicine).  Patients are able to view lab/test results, encounter notes, upcoming appointments, etc.  Non-urgent messages can be sent to your provider as well.   To learn more about what you can do with MyChart, go to [email or web address].   Other Instructions  We need to get a better idea of what your blood pressure is running at home. Here are some instructions to follow: - I would recommend using a blood pressure cuff that goes on your arm. The wrist ones can be inaccurate. If you're purchasing one for the first time, try to select one that also reports your heart rate because this can be helpful information as well. - To check your blood pressure, choose a time at least 3 hours after taking your blood pressure medicines. If you can sample it at different times of the day, that's great - it might give  you more information about how your blood pressure fluctuates. Remain seated in a chair for 5 minutes quietly beforehand, then check it.  - Please record a list of those readings and call us /send in MyChart message with them for our review in 1 week.

## 2024-06-11 ENCOUNTER — Ambulatory Visit (HOSPITAL_COMMUNITY)
Admission: RE | Admit: 2024-06-11 | Discharge: 2024-06-11 | Disposition: A | Discharge status: Home / Self Care | Source: Ambulatory Visit | Attending: Internal Medicine | Admitting: Internal Medicine

## 2024-06-11 ENCOUNTER — Ambulatory Visit: Payer: Self-pay | Admitting: Physician Assistant

## 2024-06-11 DIAGNOSIS — I340 Nonrheumatic mitral (valve) insufficiency: Secondary | ICD-10-CM | POA: Diagnosis not present

## 2024-06-11 LAB — ECHOCARDIOGRAM COMPLETE
AR max vel: 1.56 cm2
AV Peak grad: 8.1 mmHg
Ao pk vel: 1.42 m/s
Area-P 1/2: 2.66 cm2
S' Lateral: 2.7 cm

## 2024-06-15 ENCOUNTER — Other Ambulatory Visit: Payer: Self-pay | Admitting: Internal Medicine

## 2024-06-18 NOTE — Progress Notes (Signed)
 Pt has been made aware of normal result and verbalized understanding.  jw   She was reminded to send in her bp log.

## 2024-06-25 MED ORDER — ATENOLOL 50 MG PO TABS: 50.0000 mg | ORAL_TABLET | Freq: Every day | ORAL | 3 refills | Status: AC

## 2024-07-23 ENCOUNTER — Telehealth: Payer: Self-pay | Admitting: Internal Medicine

## 2024-07-23 NOTE — Telephone Encounter (Signed)
 Patient left a TOPS form to be filled out.  Form placed in Dr. Rogena box up front

## 2024-07-24 NOTE — Telephone Encounter (Signed)
 Received and placed inside providers office box

## 2024-07-25 ENCOUNTER — Telehealth: Payer: Self-pay

## 2024-07-25 NOTE — Telephone Encounter (Signed)
 I fax this back successfully at 3:3pm

## 2024-07-25 NOTE — Telephone Encounter (Signed)
 Signed.

## 2024-07-25 NOTE — Telephone Encounter (Unsigned)
 Copied from CRM [id number]. Topic: General - Other >> Jul 25, 2024 12:17 PM Frederich PARAS wrote: Reason for CRM: Javita from [employer],  received a fax around 9:40am for the tops study , she only got 2 pages of it, can it please be refaxed, she did not get the aproval page. Fax # [phone]

## 2024-07-25 NOTE — Telephone Encounter (Signed)
 Form for Patient have been fax back successfully @9 :40am

## 2024-07-25 NOTE — Telephone Encounter (Signed)
Yes will do .

## 2024-08-17 ENCOUNTER — Other Ambulatory Visit: Payer: Self-pay | Admitting: Internal Medicine

## 2024-08-22 ENCOUNTER — Telehealth: Payer: Self-pay | Admitting: Internal Medicine

## 2024-08-22 MED ORDER — ROSUVASTATIN CALCIUM 40 MG PO TABS: 40.0000 mg | ORAL_TABLET | Freq: Every day | ORAL | 3 refills | Status: AC

## 2024-08-22 NOTE — Telephone Encounter (Signed)
*  STAT* If patient is at the pharmacy, call can be transferred to refill team.   1. Which medications need to be refilled? (please list name of each medication and dose if known)   rosuvastatin  (CRESTOR ) 40 MG tablet   2. Would you like to learn more about the convenience, safety, & potential cost savings by using the [facility]?   3. Are you open to using the [facility] (Type [facility]. ).  4. Which pharmacy/location (including street and city if local pharmacy) is medication to be sent to?  [id number] - [place], [place] - [address]   5. Do they need a 30 day or 90 day supply?   90 day  Patient stated she still has some medication.

## 2024-08-22 NOTE — Telephone Encounter (Signed)
 RX sent in

## 2024-09-03 ENCOUNTER — Ambulatory Visit (INDEPENDENT_AMBULATORY_CARE_PROVIDER_SITE_OTHER)

## 2024-09-03 DIAGNOSIS — Z23 Encounter for immunization: Secondary | ICD-10-CM

## 2024-09-03 NOTE — Progress Notes (Cosign Needed Addendum)
 After obtaining consent, and per orders of Dr. Rollene, injection of HDFL given by Ronnald SHAUNNA Palms. Patient instructed to report any adverse reaction to me immediately.

## 2024-11-07 LAB — BASIC METABOLIC PANEL WITH GFR
BUN: 21 (ref 4–21)
CO2: 26 — AB (ref 13–22)
Chloride: 103 (ref 99–108)
Creatinine: 1.6 — AB (ref 0.5–1.1)
Glucose: 95
Potassium: 4.3 meq/L (ref 3.5–5.1)
Sodium: 138 (ref 137–147)

## 2024-11-07 LAB — CBC AND DIFFERENTIAL
HCT: 40 (ref 36–46)
Hemoglobin: 13.6 (ref 12.0–16.0)
Platelets: 220 K/uL (ref 150–400)
WBC: 5.5

## 2024-11-07 LAB — COMPREHENSIVE METABOLIC PANEL WITH GFR
Albumin: 4.8 (ref 3.5–5.0)
Calcium: 9.9 (ref 8.7–10.7)
eGFR: 34

## 2024-11-07 LAB — HEPATIC FUNCTION PANEL
ALT: 19 U/L (ref 7–35)
AST: 14 (ref 13–35)
Alkaline Phosphatase: 19 — AB (ref 25–125)
Bilirubin, Total: 0.5

## 2024-11-13 ENCOUNTER — Ambulatory Visit

## 2024-11-13 ENCOUNTER — Encounter: Payer: Self-pay | Admitting: Internal Medicine

## 2024-11-13 ENCOUNTER — Ambulatory Visit: Admitting: Internal Medicine

## 2024-11-13 ENCOUNTER — Ambulatory Visit: Payer: Self-pay

## 2024-11-13 VITALS — BP 138/78 | HR 67 | Temp 98.4°F | Ht 68.0 in | Wt 198.0 lb

## 2024-11-13 DIAGNOSIS — E7800 Pure hypercholesterolemia, unspecified: Secondary | ICD-10-CM | POA: Diagnosis not present

## 2024-11-13 DIAGNOSIS — N63 Unspecified lump in unspecified breast: Secondary | ICD-10-CM | POA: Insufficient documentation

## 2024-11-13 DIAGNOSIS — N1831 Chronic kidney disease, stage 3a: Secondary | ICD-10-CM | POA: Diagnosis not present

## 2024-11-13 DIAGNOSIS — E1169 Type 2 diabetes mellitus with other specified complication: Secondary | ICD-10-CM | POA: Diagnosis not present

## 2024-11-13 DIAGNOSIS — M5441 Lumbago with sciatica, right side: Secondary | ICD-10-CM | POA: Insufficient documentation

## 2024-11-13 DIAGNOSIS — E1122 Type 2 diabetes mellitus with diabetic chronic kidney disease: Secondary | ICD-10-CM | POA: Insufficient documentation

## 2024-11-13 DIAGNOSIS — M51362 Other intervertebral disc degeneration, lumbar region with discogenic back pain and lower extremity pain: Secondary | ICD-10-CM | POA: Insufficient documentation

## 2024-11-13 DIAGNOSIS — E785 Hyperlipidemia, unspecified: Secondary | ICD-10-CM | POA: Diagnosis not present

## 2024-11-13 MED ORDER — METHYLPREDNISOLONE 4 MG PO TBPK: ORAL_TABLET | ORAL | 0 refills | Status: AC

## 2024-11-13 MED ORDER — HYDROCODONE-ACETAMINOPHEN 5-325 MG PO TABS: 1.0000 | ORAL_TABLET | Freq: Four times a day (QID) | ORAL | 0 refills | Status: AC | PRN

## 2024-11-13 MED ORDER — TIZANIDINE HCL 2 MG PO TABS: 2.0000 mg | ORAL_TABLET | Freq: Three times a day (TID) | ORAL | 0 refills | Status: AC | PRN

## 2024-11-13 NOTE — Progress Notes (Unsigned)
 Subjective:  Patient ID: Audrey Fischer, female    DOB: Jun 25, 1956  Age: 68 y.o. MRN: [id number]  CC: Back Pain (Pt states she felt a catch/pinch like feeling in her back when shopping yesterday 11/12/2024... Pt states the pain is radiating down her rt leg making walking, sitting and sleeping very difficult... Pt states it all started as a cramp in her ankle and now she is having issues with ambulating)   HPI Audrey Fischer presents for f/up ----  Discussed the use of AI scribe software for clinical note transcription with the patient, who gave verbal consent to proceed.  History of Present Illness Audrey Fischer is a 68 year old female with stage three kidney disease and diabetes who presents with acute back pain.  She has acute back pain starting from the middle to the lower right side of her back, radiating down her leg. The pain began suddenly last night without any known injury and is severe enough to prevent her from sleeping.  She notes associated tingling in the lower part of her right leg. No weakness. She has not taken any medication for the pain and has never experienced similar back pain before.  No numbness, weakness, night sweats, fevers, chills, chest pain, shortness of breath, or unexplained weight loss.   Outpatient Medications Prior to Visit  Medication Sig Dispense Refill   anastrozole  (ARIMIDEX ) 1 MG tablet Take 1 tablet (1 mg total) by mouth daily. 90 tablet 4   Ascorbic Acid (VITAMIN C GUMMIE PO) Take 2,000 mg by mouth daily.     aspirin  81 MG tablet Take 81 mg by mouth daily.     atenolol  (TENORMIN ) 50 MG tablet Take 1 tablet (50 mg total) by mouth daily. 90 tablet 3   Cholecalciferol  (VITAMIN D3) 2000 UNITS TABS Take 1 tablet by mouth daily.     fluticasone  (FLONASE ) 50 MCG/ACT nasal spray Place 2 sprays into both nostrils daily. 16 g 6   lisinopril (ZESTRIL) 5 MG tablet Take 5 mg by mouth daily.     nitroGLYCERIN  (NITROSTAT ) 0.4 MG SL tablet Place 1 tablet (0.4 mg  total) under the tongue every 5 (five) minutes as needed for chest pain. 25 tablet 3   rosuvastatin  (CRESTOR ) 40 MG tablet Take 1 tablet (40 mg total) by mouth daily. 90 tablet 3   RYBELSUS 3 MG TABS Take by mouth.     vitamin B-12 (CYANOCOBALAMIN ) 500 MCG tablet Take 1 tablet (500 mcg total) by mouth daily.     HYDROcodone  bit-homatropine (HYDROMET) 5-1.5 MG/5ML syrup Take 5 mLs by mouth every 6 (six) hours as needed for cough. (Patient not taking: Reported on 11/13/2024) 120 mL 0   No facility-administered medications prior to visit.    ROS Review of Systems  Objective:  BP 138/78   Pulse 67   Temp 98.4 F (36.9 C) (Oral)   Ht 5' 8 (1.727 m)   Wt 198 lb (89.8 kg)   SpO2 98%   BMI 30.11 kg/m   BP Readings from Last 3 Encounters:  11/13/24 138/78  05/30/24 130/80  03/21/24 (!) 140/90    Wt Readings from Last 3 Encounters:  11/13/24 198 lb (89.8 kg)  05/30/24 198 lb 3.2 oz (89.9 kg)  03/21/24 190 lb 12.8 oz (86.5 kg)    Physical Exam Musculoskeletal:     Thoracic back: Normal.     Lumbar back: Normal. No swelling, edema, deformity, signs of trauma, tenderness or bony tenderness. Normal range of motion.  Negative right straight leg raise test and negative left straight leg raise test.  Neurological:     Deep Tendon Reflexes: Reflexes normal.     Reflex Scores:      Tricep reflexes are 1+ on the right side and 1+ on the left side.      Bicep reflexes are 1+ on the right side and 1+ on the left side.      Brachioradialis reflexes are 1+ on the right side and 1+ on the left side.      Patellar reflexes are 1+ on the right side and 1+ on the left side.      Achilles reflexes are 1+ on the right side and 1+ on the left side.    Lab Results  Component Value Date   WBC 5.5 11/07/2024   HGB 13.6 11/07/2024   HCT 40 11/07/2024   PLT 220 11/07/2024   GLUCOSE 91 02/08/2022   CHOL 160 02/08/2022   TRIG 80 02/08/2022   HDL 68 02/08/2022   LDLCALC 77 02/08/2022   ALT 19  11/07/2024   AST 14 11/07/2024   NA 138 11/07/2024   K 4.3 11/07/2024   CL 103 11/07/2024   CREATININE 1.6 (A) 11/07/2024   BUN 21 11/07/2024   CO2 26 (A) 11/07/2024   TSH 1.55 01/02/2019   INR 0.9 11/12/2019   HGBA1C 7.3 02/07/2024   MICROALBUR 32.5 (H) 02/07/2024    ECHOCARDIOGRAM COMPLETE Result Date: 06/11/2024    ECHOCARDIOGRAM REPORT   Patient Name:   Audrey Fischer  Date of Exam: 06/11/2024 Medical Rec #:  [id number]     Height:       68.0 in Accession #:    [id number]    Weight:       198.2 lb Date of Birth:  March 13, 1956     BSA:          2.036 m Patient Age:    67 years      BP:           130/80 mmHg Patient Gender: F             HR:           63 bpm. Exam Location:  [address] Procedure: 2D Echo, Cardiac Doppler and Color Doppler (Both Spectral and Color            Flow Doppler were utilized during procedure). Indications:    I34.0 Mitral regurgitation  History:        Patient has prior history of Echocardiogram examinations, most                 recent 11/13/2019. CAD and Previous Myocardial Infarction, s/p                 LAD and OM stents, History breast cancer, Stroke and Pulmonary                 HTN, Mitral Valve Disease; Risk Factors:Hypertension,                 Dyslipidemia and Diabetes. Previous echo revealed LVEF 65% mild                 MR and mild TR moderate elevated PAP 44 mmHg.  Sonographer:    Cherene Ravens RCS Referring Phys: DAYNA N DUNN IMPRESSIONS  1. Left ventricular ejection fraction, by estimation, is 60 to 65%. The left ventricle has normal function. The left ventricle has no regional wall motion  abnormalities. There is mild left ventricular hypertrophy. Left ventricular diastolic parameters were normal.  2. Right ventricular systolic function is normal. The right ventricular size is normal. There is normal pulmonary artery systolic pressure. The estimated right ventricular systolic pressure is 28.4 mmHg.  3. The mitral valve is normal in structure. Trivial mitral valve  regurgitation. No evidence of mitral stenosis.  4. The aortic valve is tricuspid. Aortic valve regurgitation is not visualized. No aortic stenosis is present.  5. The inferior vena cava is normal in size with greater than 50% respiratory variability, suggesting right atrial pressure of 3 mmHg. FINDINGS  Left Ventricle: Left ventricular ejection fraction, by estimation, is 60 to 65%. The left ventricle has normal function. The left ventricle has no regional wall motion abnormalities. The left ventricular internal cavity size was normal in size. There is  mild left ventricular hypertrophy. Left ventricular diastolic parameters were normal. Right Ventricle: The right ventricular size is normal. No increase in right ventricular wall thickness. Right ventricular systolic function is normal. There is normal pulmonary artery systolic pressure. The tricuspid regurgitant velocity is 2.52 m/s, and  with an assumed right atrial pressure of 3 mmHg, the estimated right ventricular systolic pressure is 28.4 mmHg. Left Atrium: Left atrial size was normal in size. Right Atrium: Right atrial size was normal in size. Pericardium: Trivial pericardial effusion is present. Mitral Valve: The mitral valve is normal in structure. Trivial mitral valve regurgitation. No evidence of mitral valve stenosis. Tricuspid Valve: The tricuspid valve is normal in structure. Tricuspid valve regurgitation is mild . No evidence of tricuspid stenosis. Aortic Valve: The aortic valve is tricuspid. Aortic valve regurgitation is not visualized. No aortic stenosis is present. Aortic valve peak gradient measures 8.1 mmHg. Pulmonic Valve: The pulmonic valve was normal in structure. Pulmonic valve regurgitation is trivial. No evidence of pulmonic stenosis. Aorta: The aortic root is normal in size and structure. Venous: The inferior vena cava is normal in size with greater than 50% respiratory variability, suggesting right atrial pressure of 3 mmHg. IAS/Shunts: No  atrial level shunt detected by color flow Doppler.  LEFT VENTRICLE PLAX 2D LVIDd:         4.30 cm   Diastology LVIDs:         2.70 cm   LV e' medial:    7.72 cm/s LV PW:         1.10 cm   LV E/e' medial:  10.9 LV IVS:        1.10 cm   LV e' lateral:   11.70 cm/s LVOT diam:     1.70 cm   LV E/e' lateral: 7.2 LV SV:         51 LV SV Index:   25 LVOT Area:     2.27 cm  RIGHT VENTRICLE             IVC RV Basal diam:  2.60 cm     IVC diam: 1.10 cm RV Mid diam:    2.30 cm RV S prime:     17.30 cm/s TAPSE (M-mode): 2.1 cm LEFT ATRIUM           Index        RIGHT ATRIUM          Index LA Vol (A4C): 44.2 ml 21.71 ml/m  RA Area:     8.99 cm  RA Volume:   17.20 ml 8.45 ml/m  AORTIC VALVE AV Area (Vmax): 1.56 cm AV Vmax:        142.00 cm/s AV Peak Grad:   8.1 mmHg LVOT Vmax:      97.90 cm/s LVOT Vmean:     62.200 cm/s LVOT VTI:       0.223 m  AORTA Ao Root diam: 2.40 cm Ao Asc diam:  3.10 cm MITRAL VALVE                TRICUSPID VALVE MV Area (PHT): 2.66 cm     TR Peak grad:   25.4 mmHg MV Decel Time: 285 msec     TR Vmax:        252.00 cm/s MV E velocity: 84.30 cm/s MV A velocity: 102.00 cm/s  SHUNTS MV E/A ratio:  0.83         Systemic VTI:  0.22 m                             Systemic Diam: 1.70 cm Soyla Merck MD Electronically signed by Soyla Merck MD Signature Date/Time: 06/11/2024/3:21:48 PM    Final    DG Lumbar Spine Complete Result Date: 11/13/2024 CLINICAL DATA:  Acute lower back pain without known injury. EXAM: LUMBAR SPINE - COMPLETE 4+ VIEW COMPARISON:  None Available. FINDINGS: There is no evidence of lumbar spine fracture. Alignment is normal. Minimal degenerative disc disease is noted at L4-5. Degenerative changes are seen involving posterior facet joints of L3-4, L4-5 and L5-S1. IMPRESSION: Multilevel degenerative changes as described above. No acute abnormality seen. Electronically Signed   By: Lynwood Landy Raddle M.D.   On: 11/13/2024 14:29    Estimated  Creatinine Clearance: 40.6 mL/min (A) (by C-G formula based on SCr of 1.6 mg/dL (A)).   The ASCVD Risk score (Arnett DK, et al., 2019) failed to calculate for the following reasons:   Risk score cannot be calculated because patient has a medical history suggesting prior/existing ASCVD   * - Cholesterol units were assumed   Assessment & Plan:  Acute right-sided low back pain with right-sided sciatica -     DG Lumbar Spine Complete; Future -     methylPREDNISolone ; TAKE AS DIRECTED  Dispense: 21 tablet; Refill: 0 -     tiZANidine  HCl; Take 1 tablet (2 mg total) by mouth every 8 (eight) hours as needed for muscle spasms.  Dispense: 90 tablet; Refill: 0 -     HYDROcodone -Acetaminophen ; Take 1 tablet by mouth every 6 (six) hours as needed for up to 5 days for moderate pain (pain score 4-6).  Dispense: 20 tablet; Refill: 0  Hyperlipidemia associated with type 2 diabetes mellitus (HCC) -     Lipid panel; Future -     TSH; Future -     Hepatic function panel; Future  Pure hypercholesterolemia -     Lipid panel; Future -     TSH; Future -     Hepatic function panel; Future  Type 2 diabetes mellitus with stage 3a chronic kidney disease, without long-term current use of insulin (HCC) -     Hemoglobin A1c; Future  Degeneration of intervertebral disc of lumbar region with discogenic back pain and lower extremity pain -     methylPREDNISolone ; TAKE AS DIRECTED  Dispense: 21 tablet; Refill: 0 -     tiZANidine  HCl; Take 1 tablet (2 mg total) by mouth every 8 (eight) hours as  needed for muscle spasms.  Dispense: 90 tablet; Refill: 0 -     HYDROcodone -Acetaminophen ; Take 1 tablet by mouth every 6 (six) hours as needed for up to 5 days for moderate pain (pain score 4-6).  Dispense: 20 tablet; Refill: 0     Follow-up: Return if symptoms worsen or fail to improve.  Debby Molt, MD

## 2024-11-13 NOTE — Telephone Encounter (Signed)
 FYI Only or Action Required?: FYI only for provider: appointment scheduled on 11/13/2024.  Patient was last seen in primary care on 03/21/2024 by Alvia Corean CROME, FNP.  Called Nurse Triage reporting Back Pain.  Symptoms began yesterday.  Interventions attempted: Nothing.  Symptoms are: gradually worsening.  Triage Disposition: See HCP Within 4 Hours (Or PCP Triage)  Patient/caregiver understands and will follow disposition?: Yes    Copied from CRM #[id number]. Topic: Clinical - Red Word Triage >> Nov 13, 2024  8:51 AM Amy B wrote: Red Word that prompted transfer to Nurse Triage: Lower back pain radiating down right leg, difficulty walking Reason for Disposition  [1] Pain radiates into the thigh or further down the leg AND [2] both legs  Answer Assessment - Initial Assessment Questions 1. ONSET: When did the pain begin? (e.g., minutes, hours, days)     yesterday 2. LOCATION: Where does it hurt? (upper, mid or lower back)     Right Low back 3. SEVERITY: How bad is the pain?  (e.g., Scale 1-10; mild, moderate, or severe)     moderate 4. PATTERN: Is the pain constant? (e.g., yes, no; constant, intermittent)      constant 5. RADIATION: Does the pain shoot into your legs or somewhere else?     Right leg 6. CAUSE:  What do you think is causing the back pain?      na 7. BACK OVERUSE:  Any recent lifting of heavy objects, strenuous work or exercise?     no 8. MEDICINES: What have you taken so far for the pain? (e.g., nothing, acetaminophen , NSAIDS)     na 9. NEUROLOGIC SYMPTOMS: Do you have any weakness, numbness, or problems with bowel/bladder control?     no 10. OTHER SYMPTOMS: Do you have any other symptoms? (e.g., fever, abdomen pain, burning with urination, blood in urine)       no 11. PREGNANCY: Is there any chance you are pregnant? When was your last menstrual period?       na  Protocols used: Back Pain-A-AH

## 2024-11-13 NOTE — Patient Instructions (Signed)
Sciatica  Sciatica is pain, numbness, weakness, or tingling along the path of the sciatic nerve. The sciatic nerve starts in the lower back and runs down the back of each leg. The nerve controls the muscles in the lower leg and in the back of the knee. It also provides feeling (sensation) to the back of the thigh, the lower leg, and the sole of the foot. Sciatica is a symptom of another medical condition that pinches or puts pressure on the sciatic nerve. Sciatica most often only affects one side of the body. Sciatica usually goes away on its own or with treatment. In some cases, sciatica may come back (recur). What are the causes? This condition is caused by pressure on the sciatic nerve or pinching of the nerve. This may be the result of: A disk in between the bones of the spine bulging out too far (herniated disk). Age-related changes in the spinal disks. A pain disorder that affects a muscle in the buttock. Extra bone growth near the sciatic nerve. A break (fracture) of the pelvis. Pregnancy. Tumor. This is rare. What increases the risk? The following factors may make you more likely to develop this condition: Playing sports that place pressure or stress on the spine. Having poor strength and flexibility. A history of back injury or surgery. Sitting for long periods of time. Doing activities that involve repetitive bending or lifting. Obesity. What are the signs or symptoms? Symptoms can vary from mild to very severe. They may include: Any of the following problems in the lower back, leg, hip, or buttock: Mild tingling, numbness, or dull aches. Burning sensations. Sharp pains. Numbness in the back of the calf or the sole of the foot. Leg weakness. Severe back pain that makes movement difficult. Symptoms may get worse when you cough, sneeze, or laugh, or when you sit or stand for long periods of time. How is this diagnosed? This condition may be diagnosed based on: Your symptoms  and medical history. A physical exam. Blood tests. Imaging tests, such as: X-rays. An MRI. A CT scan. How is this treated? In many cases, this condition improves on its own without treatment. However, treatment may include: Reducing or modifying physical activity. Exercising, including strengthening and stretching. Icing and applying heat to the affected area. Medicines that help to: Relieve pain and swelling. Relax your muscles. Injections of medicines that help to relieve pain and inflammation (steroids) around the sciatic nerve. Surgery. Follow these instructions at home: Medicines Take over-the-counter and prescription medicines only as told by your health care provider. Ask your health care provider if the medicine prescribed to you requires you to avoid driving or using heavy machinery. Managing pain     If directed, put ice on the affected area. To do this: Put ice in a plastic bag. Place a towel between your skin and the bag. Leave the ice on for 20 minutes, 2-3 times a day. If your skin turns bright red, remove the ice right away to prevent skin damage. The risk of skin damage is higher if you cannot feel pain, heat, or cold. If directed, apply heat to the affected area as often as told by your health care provider. Use the heat source that your health care provider recommends, such as a moist heat pack or a heating pad. Place a towel between your skin and the heat source. Leave the heat on for 20-30 minutes. If your skin turns bright red, remove the heat right away to prevent Gotschall. The   risk of Jansma is higher if you cannot feel pain, heat, or cold. Activity  Return to your normal activities as told by your health care provider. Ask your health care provider what activities are safe for you. Avoid activities that make your symptoms worse. Take brief periods of rest throughout the day. When you rest for longer periods, mix in some mild activity or stretching between  periods of rest. This will help to prevent stiffness and pain. Avoid sitting for long periods of time without moving. Get up and move around at least one time each hour. Exercise and stretch regularly as told by your health care provider. Do not lift anything that is heavier than 10 lb (4.5 kg) until your health care provider says that it is safe. When you do not have symptoms, you should still avoid heavy lifting, especially repetitive heavy lifting. When you lift objects, always use proper lifting technique, which includes: Bending your knees. Keeping the load close to your body. Avoiding twisting. General instructions Maintain a healthy weight. Excess weight puts extra stress on your back. Wear supportive, comfortable shoes. Avoid wearing high heels. Avoid sleeping on a mattress that is too soft or too hard. A mattress that is firm enough to support your back when you sleep may help to reduce your pain. Contact a health care provider if: Your pain is not controlled by medicine. Your pain does not improve or gets worse. Your pain lasts longer than 4 weeks. You have unexplained weight loss. Get help right away if: You are not able to control when you urinate or have bowel movements (incontinence). You have: Weakness in your lower back, pelvis, buttocks, or legs that gets worse. Redness or swelling of your back. A burning sensation when you urinate. Summary Sciatica is pain, numbness, weakness, or tingling along the path of the sciatic nerve, which may include the lower back, legs, hips, and buttocks. This condition is caused by pressure on the sciatic nerve or pinching of the nerve. Treatment often includes rest, exercise, medicines, and applying ice or heat. This information is not intended to replace advice given to you by your health care provider. Make sure you discuss any questions you have with your health care provider. Document Revised: 02/22/2022 Document Reviewed:  02/22/2022 Elsevier Patient Education  2024 [employer]  

## 2024-11-16 ENCOUNTER — Ambulatory Visit: Payer: Self-pay

## 2024-11-16 NOTE — Telephone Encounter (Signed)
 FYI Only or Action Required?: Action required by provider: clinical question for provider.  Patient was last seen in primary care on 11/13/2024 by Joshua Debby CROME, MD.  Called Nurse Triage reporting Back Pain.  Symptoms began several days ago.  Interventions attempted: Prescription medications: Tizanidine , Hyrdocodone-acetaminophen , Methylprednisone, Rest, hydration, or home remedies, and Ice/heat application.  Symptoms are: unchanged.  Triage Disposition: Call PCP Within 24 Hours  Patient/caregiver understands and will follow disposition?: Yes  Reason for Disposition  [1] Caller has NON-URGENT question (includes prescribed medication questions) AND [2] triager unable to answer  Answer Assessment - Initial Assessment Questions Patient states that she has been taking prescribed medications since Tuesday's appt and has found some relief with pain, however, she is still feeling a dull constant pain and would like to know if there is anything else she can take to help with this.   1. MAIN CONCERN OR SYMPTOM:  What is your main concern right now? What question do you have? What's the main symptom you're worried about? (e.g., breathing difficulty, cough, fever, pain)     Pain  2. ONSET: When did the  Pain  start?     Monday 11/12/24  3. BETTER-SAME-WORSE: Are you getting better, staying the same, or getting worse compared to how you felt at your last visit to the doctor (most recent medical visit)?     Staying the same  4. VISIT DATE: When were you seen? (e.g., date)     11/13/24  5. VISIT DOCTOR: What is the name of the doctor taking care of you now?     Dr. Rollene, but saw Dr. Joshua for appt on 11/13/24  6. VISIT DIAGNOSIS:  What was the main symptom or problem that you were seen for? Were you given a diagnosis?      Lower back pain with sciatica  7. VISIT MEDICINES: Did the doctor order any new medicines for you to use? If Yes, ask: Have you filled the  prescription and started taking the medicine?      Hydrocodone -acetaminophen , Tizanidine , Methylprednisone  8. NEXT APPOINTMENT: Have you scheduled a follow-up appointment with your doctor?     NA  9. PAIN: Is there any pain? If Yes, ask: How bad is it?  (Scale 0-10; or none, mild, moderate, severe)     Yes, mild when resting and moderate-severe with movement  10. FEVER: Do you have a fever? If Yes, ask: What is it, how was it measured  and when did it start?       No  11. OTHER SYMPTOMS: Do you have any other symptoms?       No  12. PREGNANCY: Is there any chance you are pregnant? When was your last menstrual period?       NA  Protocols used: Recent Medical Visit for Illness Follow-up Call-A-AH  Copied from CRM #[id number]. Topic: Clinical - Red Word Triage >> Nov 16, 2024  9:27 AM Emylou G wrote: [facility] that prompted transfer to Nurse Triage: In Pain.. Lower back pain radiating down right leg, difficulty walking ( flare up )

## 2024-12-03 ENCOUNTER — Ambulatory Visit: Payer: Self-pay | Admitting: Internal Medicine

## 2024-12-03 NOTE — Telephone Encounter (Signed)
 FYI Only or Action Required?: Action required by provider: medication refill request and appointment scheduled 12/10/24.  Patient was last seen in primary care on 11/13/2024 by Joshua Debby CROME, MD.  Called Nurse Triage reporting Back Pain.  Symptoms began several weeks ago.  Interventions attempted: OTC medications: Tylenol  and topical muscle rub and Prescription medications: tizanidine  and hydrocodone .  Symptoms are: stable.  Triage Disposition: See PCP Within 2 Weeks (overriding See PCP When Office is Open (Within 3 Days))  Patient/caregiver understands and will follow disposition?: Yes                                  1. ONSET: When did the pain begin? (e.g., minutes, hours, days)     3 weeks ago, OV on 11/13/24 for symptoms, states symptoms improve with pain medication, but have not gone away 2. LOCATION: Where does it hurt? (upper, mid or lower back)     Lower back or buttocks 3. SEVERITY: How bad is the pain?  (e.g., Scale 1-10; mild, moderate, or severe)     Rates pain a 5 at this time, after taking Tylenol  extra strength and applying topical muscle rub 4. PATTERN: Is the pain constant? (e.g., yes, no; constant, intermittent)      Intermittent 5. RADIATION: Does the pain shoot into your legs or somewhere else?     Radiates down right leg 8. MEDICINES: What have you taken so far for the pain? (e.g., nothing, acetaminophen , NSAIDS)     Tizanidine  and hydrocodone , states she is out of hydrocodone  9. NEUROLOGIC SYMPTOMS: Do you have any weakness, numbness, or problems with bowel/bladder control?     Denies weakness, denies numbness 10. OTHER SYMPTOMS: Do you have any other symptoms? (e.g., fever, abdomen pain, burning with urination, blood in urine)     Limping Denies abdominal pain, denies fever    This RN advised in-person evaluation. This RN offered first availability in PCP office with alternate provider. Patient declined and  stated she wanted to see her PCP. Patient has been scheduled with PCP for 12/10/24. Patient is requesting pain medication in the meantime. Please advise.  Reason for Disposition  [1] MODERATE back pain (e.g., interferes with normal activities) AND [2] present > 3 days  Protocols used: Back Pain-A-AH  Copied from CRM #[id number]. Topic: Clinical - Red Word Triage >> Dec 03, 2024  9:22 AM Robinson DEL wrote: Red Word that prompted transfer to Nurse Triage: Back problems, issues with lower back pain radiating down on right leg pain is an 8, can move and walk can't lay down, was in on 12/16 for same issues states it's better because she can now put her foot down but still in pain and pain is an 8

## 2024-12-03 NOTE — Telephone Encounter (Signed)
 Can you call and find out current condition since this info is from 2 weeks ago?

## 2024-12-04 ENCOUNTER — Ambulatory Visit: Payer: Self-pay

## 2024-12-04 NOTE — Telephone Encounter (Signed)
 FYI Only or Action Required?: Action required by provider: patient is requesting pain management for back pain .  Patient was last seen in primary care on 11/13/2024 by Joshua Debby CROME, MD.  Called Nurse Triage reporting Back Pain.  Symptoms began 11/12/24.  Interventions attempted: Other: tizanidine  , extra strength tylenol  and hydrocodone .  Symptoms are: gradually worsening.  Triage Disposition: See HCP Within 4 Hours (Or PCP Triage)  Patient/caregiver understands and will follow disposition?: No, wishes to speak with PCP            Reason for Disposition  [1] SEVERE back pain (e.g., excruciating, unable to do any normal activities) AND [2] not improved 2 hours after pain medicine  Answer Assessment - Initial Assessment Questions (This RN review triage note from yesterday)  Patient reports has not had good night sleep since 12/15. At night pain worse. Not sleeping. Back pain. Has been asking for medication to last until goes to see PCP 12/10/2024. Back pain is now in lower back center  of the back but goes from right side of the lower back . Pain level is , has taking extra strength tylenol , tizanidine , at night pain is worse , daytime can deal with it, when had hydrocodone  could get at least 4 hours sleep. Would wake up move around and then would cat nap . When pain is right sider of lower back can radiate down right leg to ankle. Pain 5/10 now, at ngiht 9/10 , has 1 hydrocodone  holding on to. Patient requesting this statement be In quotes and sent to provider and said she will be saying it her visit 1/12 as well patient states I Understand some doctor do not give pain medication to african american women , because they feel they can take it, I am not one of those women this RN tried to find sooner appointment with PCP not able to find sooner patient requesting PCP only has appointment 12/11/23 is looking for pain management until that appointment and is requesting a call back        1. ONSET: When did the pain begin? (e.g., minutes, hours, days)     11/12/24 2. LOCATION: Where does it hurt? (upper, mid or lower back)     Lower back , was right side lower now moved middle to lower back  3. SEVERITY: How bad is the pain?  (e.g., Scale 1-10; mild, moderate, or severe)     5/10 right now, at night 9/10 not able to sleep  4. PATTERN: Is the pain constant? (e.g., yes, no; constant, intermittent)      Constant and worse at night  5. RADIATION: Does the pain shoot into your legs or somewhere else?     Mentions when pain is right side lower back can go into buttocks  and down to ankle right sided  6. CAUSE:  What do you think is causing the back pain?      Unknown denies known injury   8. MEDICINES: What have you taken so far for the pain? (e.g., nothing, acetaminophen , NSAIDS)     Extra strength tylenol , tizanidine , and hydrocodone  (has 1 left)  9. NEUROLOGIC SYMPTOMS: Do you have any weakness, numbness, or problems with bowel/bladder control?     Patient denies  10. OTHER SYMPTOMS: Do you have any other symptoms? (e.g., fever, abdomen pain, burning with urination, blood in urine)      Patient denies the following chest pain, shortness of breath, vomiting, blood in urine, fever , abdominal pain  Patient denies the following chest pain, shortness of breath, vomiting, blood in urine, fever , abdominal pain  Protocols used: Back Pain-A-AH  Copied from CRM #[id number]. Topic: Clinical - Red Word Triage >> Dec 04, 2024 12:18 PM Jayma L wrote: Red Word that prompted transfer to Nurse Triage: back pain getting worse

## 2024-12-04 NOTE — Progress Notes (Signed)
 "   BREAST CANCER SURVIVORSHIP VISIT   Oncology History  Infiltrating ductal carcinoma of left breast    (CMD)  07/09/2015 Initial Diagnosis   Infiltrating ductal carcinoma of left breast (HCC), diagnosed by outside left core biopsy   09/03/2015 -  Cancer Staged   Staging form: Breast, AJCC V7 - Clinical stage from 09/03/2015: Stage IA (T1c, N0, M0) - Signed by Interface, Edi Beacon on 09/03/2015    09/24/2015 Surgery   Left lumpectomy, SLN bx    - 02/13/2016 Radiation   L breast adjuvant irradiation (@ [facility])   10/10/2015 -  Cancer Staged   Staging form: Breast, AJCC V7 - Pathologic stage from 10/10/2015: Stage IA (T1c, N0, cM0) - Signed by Interface, Edi Beacon on 10/10/2015    03/01/2016 -  Chemotherapy   Tamoxifen       Stopped  AI therapy 2024   DIAGNOSIS:   1) Stage 1A (T1cN0M0) Left breast invasive ductal carcinoma, 1.6 cm, Grade 2, 0/3 SLN+, ER/PR 100% Her2 FISH neg. Diagnosed by core biopsy 07/09/15  2) Oncotype Dx: Low risk, 7%    TREATMENT SUMMARY:    1) Surgery: Left central lumpectomy, sentinel lymph node mapping and biopsy 09/24/15 per Dr. Claryce  2) Radiation therapy: Completed external beam to the left breast in 01/2016 per Dr. Dewey.   3) Endocrine: Tamoxifen  initiated 02/2016 per Dr. Layla- changed to Anastrozole  10/2018     HISTORY OF PRESENT ILLNESS: Audrey Fischer is a  69 y.o. female [facility] by Audrey Null, MD.  She had a routine mammogram in 06/2015 at which time a left breast mass was discovered.  She had core  needle biopsy on 07/09/15 which returned as invasive ductal carcinoma, ER/PR 100% Her2 negative.  She elected for breast conservation therapies. She underwent a left central lumpectomy with sentinel lymph node mapping and biopsy with Dr. Claryce on 09/24/15.  Final pathology revealed a 1.6 cm invasive ductal carcinoma, grade 2, margins negative, three negative sentinel lymph nodes, ER/PR 100% Her2 negative. An Oncotype DX test  revealed a 10-year risk of 7%.     11/25/2022: [name] presents to the survivorship clinic today for follow-up. She has a history of a Stage I left breast cancer diagnosed  in 2016 with treatment summarized above. Since her last visit she reports she is doing well.   Denies any other changes to medical, surgical or family history.  She has appreciated no changes in self breast exams. Denies any masses, lumps, suspicious skin changes, nipple abnormalities, enlarged lymph nodes, upper extremity edema, or ROM issues.  Denies any new onset headaches, dizziness, vision changes, mental status changes, shortness of breath, persistent cough, chest pain, night  sweats, unexplained fevers, unintentional weight loss, new bone or joint pain.   She is followed by  Dr. Layla in [place] and continues on Anastrozole , tolerating well. She would like to discuss with Dr. Layla the possibility of discontinuing her endocrine  therapy due to side effects.  She has not been exercising consistently since the colder weather has arrived.  12/01/2023: Audrey Fischer presents to clinic today for annual follow up. She has a history of Stage I left IDC ER/PR positive Her2 neg breast cancer diagnosed  in 2016, s/p left lumpectomy w/SLNB on 09/24/2015. She completed radiation therapy. She stopped anastrozole  last year. She did not get in touch with her medical oncologist regarding a BCI. She no longer follows with them.  Since her last visit  she reports that she is doing well. Denies any other changes to medical, surgical or family history. She has appreciated no changes in self breast exams. Denies any masses, lumps, suspicious skin changes, nipple abnormalities, enlarged lymph nodes, upper extremity edema, or ROM issues. Denies any new onset headaches, dizziness, vision changes, mental status changes, shortness of breath, persistent cough, chest pain, night sweats, unexplained fevers, unintentional weight loss, new bone or  joint pain.  12/06/2023: Patient presents for annual surveillance with a history of  stage I left IDC ER/PR positive Her2 neg breast cancer diagnosed  in 2016, s/p left lumpectomy w/SLNB on 09/24/2015. She completed radiation therapy. BCI result showed no benefit with extended endocrine therapy. She has stopped this.    12/06/2024: Patient presents for annual surveillance with a history of  Stage I left IDC ER/PR positive Her2 neg breast cancer diagnosed  in 2016, s/p left lumpectomy w/SLNB on 09/24/2015. She completed radiation therapy.She completed adjuvant AI therapy. Since last visit she reports that she is doing well. She denies changes in her self breast exams. . She has had no new cancer diagnoses in her immediate family.Energy and appetite is good. Weight is stable.   She is having severe back pain. She started having pain mis December and has required hydrocodone  and tizanidine . She saw someone at her PCP practice and had Xrays that showed  arthritis . Pain is in her right buttock, radiating to groin and down leg to her ankle. She has no numbness or tingling in the leg. Pain is slightly better.She has not had a lumbar MRI. She was walking 14 miles per week before the pain started in December.     PAST MEDICAL HISTORY:   She  has a past medical history of Breast cancer, left breast (HCC) (07/09/2015), Coronary artery disease, Heart disease, Hypercholesterolemia, and Hypertension.    PAST SURGICAL HISTORY:   She  has a past surgical history that includes Cesarean section (10/09/1996); Ovum / oocyte retrieval (01/1993); Stent Placement (01/2008); Hysteroscopy (08/15/2015); Sentinel Lymph Node Biopsy  (Left, 09/23/2015); and Breast Lumpectomy (Left, 09/23/2015).    MEDICATIONS:    Current Outpatient Medications:    acetaminophen  (TYLENOL ) 500 mg tablet, Take 500 mg by mouth every 6 (six) hours as needed for mild pain (1-3)., Disp: , Rfl:    aspirin  81 mg chewable tablet, Take 81 mg by  mouth Once Daily., Disp: , Rfl:    cholecalciferol  (VITAMIN D3) 1,000 unit (25 mcg) tablet, Take 5,000 Units by mouth Once Daily., Disp: , Rfl:    FreeStyle Libre 3 Plus Sensor, Replace every 15 days, Disp: 2 each, Rfl: 3   lisinopriL (PRINIVIL) 5 mg tablet, Take 1 tablet (5 mg total) by mouth daily., Disp: 90 tablet, Rfl: 3   nitroglycerin  (NITROSTAT ) 0.4 mg SL tablet, Place 0.4 mg under the tongue., Disp: , Rfl:    rosuvastatin  (CRESTOR ) 10 mg tablet, TAKE 1 TABLET BY MOUTH EVERY DAY *NEED CHOLESTEROL TEST, Disp: 30 tablet, Rfl: 0   Rybelsus 3 mg tab tablet, Take 1 tablet (3 mg total) by mouth daily. Take first thing in the AM with 4 oz of water. Do not eat or take other meds for 1 hour., Disp: 30 tablet, Rfl: 5   tiZANidine  (ZANAFLEX ) 2 mg tablet, Take 2 mg by mouth every 6 (six) hours as needed for muscle spasms., Disp: , Rfl:    atenoloL  (TENORMIN ) 100 mg tablet, TAKE 1/2 TABLET BY MOUTH EVERY DAY (Patient not taking: Reported on 12/06/2024), Disp:  15 tablet, Rfl: 0     ALLERGIES:  Capsaicin and Gelatin      GYN HISTORY:    Age of menarche: 69 yo; Age of last period: 69 yo; History of hormonal therapy: None; G2P2.     SOCIAL HISTORY:     Social History         Tobacco Use       Smoking Status       Smokeless Tobacco          Social History         Substance and Sexual Activity       Alcohol Use               FAMILY HISTORY: family history includes Breast cancer in her paternal aunt; Diabetes in her father, maternal grandfather, maternal grandmother, mother, paternal grandfather, and paternal grandmother; Heart attack in her father; Hyperlipidemia in her mother; Hypertension in her father, maternal grandfather, maternal grandmother, mother, paternal grandfather, and paternal grandmother; Kidney disease in her mother; Rectal cancer in her mother; Stroke in her mother and paternal grandmother.    Occupation: Runner, Broadcasting/film/video , retired. Working part time for a non profit.  Widowed    Lives with son and daughter   Smoker:  Never  Alcohol: Occasionally     ROS:  A complete ROS was performed with pertinent positives/negatives noted in the HPI. The remainder of the ROS are negative.    PHYSICAL EXAMINATION:   Vital signs:  Vitals:   12/06/24 1442  BP: (!) 85/53  Pulse: 85  Temp: 97.1 F (36.2 C)  SpO2: 98%    BP  low due to muscle relaxer use.  GENERAL: Well-developed, well-nourished female in mild discomfort due to back pain.  LUNGS: Non dyspneic on room air.  CARDIAC: Appears well perfused.   BREASTS: Comprehensive breast exam performed in the seated and supine positions. Right breast without discrete palpable mass, No  suspicious skin changes, or nipple changes.   Left breast with well-healed central lumpectomy scar and partial nipple resection. No discrete palpable mass or suspicious skin changes. Tender at the lumpectomy site, chronic and unchanged.   ABDOMEN: deferred  EXTREMITIES: No lymphedema of upper extremities. ROM full  SKIN:  No suspicious lesions noted in limited exam.  NEUROLOGIC: No focal deficits  Alert and conversant       RADIOLOGY:  [facility] Breast Screening Tomo Bilat Narrative: BILATERAL SCREENING MAMMOGRAM, 12/06/2024 2:20 PM  INDICATION: Encounter for other screening for malignant neoplasm of breast \ Z12.39 Encounter for other screening for malignant neoplasm of breast   COMPARISON: Multiple priors.  TECHNIQUE: CC and MLO views were done of each breast with digital technique, 3D tomosynthesis, and computer-aided detection.  FINDINGS:  Stable lumpectomy changes in the left breast. There are no suspicious findings. A letter is sent to the patient with her results.    Impression: No specific mammographic evidence of malignancy.    Breast composition: There are scattered areas of fibroglandular density.  BI-RADS Category: 2 - Benign findings. Recommend routine follow-up.  RECOMMENDATION: Follow-up with routine mammography with screening  mammography in 1 year.  [place]  law requires that mammography facilities inform patients of their breast density. Higher density breasts may decrease sensitivity of mammography and may be associated with an increased risk of breast cancer. Patients with heterogeneously dense or extremely dense breast tissue should talk with their doctor about alternative screening strategies. The following website includes further information to assist patients and physicians: https://ncacr.org/breast-health/. The patient  lay summary letter includes their breast density, a link to additional online resources, and a recommendation for those with dense breasts to discuss this factor further with their doctor.      ASSESSMENT:  Audrey Fischer  is a 69 y.o. female with a history  of a Stage 1A (T1cN0M0) ER/PR+ Left breast cancer diagnosed in 2016. She is s/p lumpectomy with sentinel lymph node mapping and biopsy, followed by adjuvant external beam radiation therapy. She stopped  anastrozole  after five years. She has had BCI testing.It showed no benefit to extended endocrine therapy.  Clinically there is no evidence of recurrent disease on physical exam or by report today. Mammogram demonstrates no mammographic evidence of malignancy. Breast imaging personally reviewed today.  She is having LBP and right leg pain.    PLAN:  --bone scan ordered for back pain.  --referral placed for PT for LBP Follow up with PCP as scheduled next week for back pain.  -- Return to the Survivorship Clinic in 1 year following bilateral screening mammogram. Order placed today.  -- Health maintenance and labs per PCP.    -- All of the patient's questions were answered. Patient verbalizes understanding and agrees to the plan of care.Patient will call for further questions or concerns.    Electronically signed by: Nena Neta Hock, NP 12/06/2024 3:00 PM               "

## 2024-12-05 ENCOUNTER — Other Ambulatory Visit: Payer: Self-pay | Admitting: Internal Medicine

## 2024-12-05 DIAGNOSIS — M51362 Other intervertebral disc degeneration, lumbar region with discogenic back pain and lower extremity pain: Secondary | ICD-10-CM

## 2024-12-05 DIAGNOSIS — M5441 Lumbago with sciatica, right side: Secondary | ICD-10-CM

## 2024-12-05 NOTE — Telephone Encounter (Signed)
 Attempted to call pt. Has a mailbox that has not been set up. Unable to leave message. Pt has OV on 12/10/24.

## 2024-12-06 ENCOUNTER — Ambulatory Visit: Payer: Self-pay

## 2024-12-06 NOTE — Telephone Encounter (Signed)
 FYI Only or Action Required?: FYI only for provider: appointment scheduled on 11/17/2025.  Patient was last seen in primary care on 11/13/2024 by Joshua Debby CROME, MD.  Called Nurse Triage reporting Back Pain.  Symptoms began several weeks ago.  Interventions attempted: OTC medications: Tylenol  and Prescription medications: tizanidine .  Symptoms are: unchanged.  Triage Disposition: See PCP Within 2 Weeks  Patient/caregiver understands and will follow disposition?: Yes  Reason for Disposition  Back pain present > 2 weeks  Answer Assessment - Initial Assessment Questions 1. ONSET: When did the pain begin? (e.g., minutes, hours, days)     12/15 2. LOCATION: Where does it hurt? (upper, mid or lower back)     Low back 3. SEVERITY: How bad is the pain?  (e.g., Scale 1-10; mild, moderate, or severe)     3 4. PATTERN: Is the pain constant? (e.g., yes, no; constant, intermittent)      Comes and go 5. RADIATION: Does the pain shoot into your legs or somewhere else?     Denies 8. MEDICINES: What have you taken so far for the pain? (e.g., nothing, acetaminophen , NSAIDS)     Tizandine  9. NEUROLOGIC SYMPTOMS: Do you have any weakness, numbness, or problems with bowel/bladder control?     Denies 10. OTHER SYMPTOMS: Do you have any other symptoms? (e.g., fever, abdomen pain, burning with urination, blood in urine) Denies  Protocols used: Back Pain-A-AH

## 2024-12-07 NOTE — Telephone Encounter (Signed)
 Pt has appt on 12/18/24 with Dr. Rollene to discuss.

## 2024-12-08 ENCOUNTER — Other Ambulatory Visit: Payer: Self-pay | Admitting: Internal Medicine

## 2024-12-08 DIAGNOSIS — M5441 Lumbago with sciatica, right side: Secondary | ICD-10-CM

## 2024-12-08 DIAGNOSIS — M51362 Other intervertebral disc degeneration, lumbar region with discogenic back pain and lower extremity pain: Secondary | ICD-10-CM

## 2024-12-10 ENCOUNTER — Ambulatory Visit: Admitting: Internal Medicine

## 2024-12-18 ENCOUNTER — Ambulatory Visit: Admitting: Internal Medicine

## 2024-12-18 VITALS — BP 122/80 | HR 61 | Temp 97.5°F | Ht 68.0 in | Wt 201.0 lb

## 2024-12-18 DIAGNOSIS — N1832 Chronic kidney disease, stage 3b: Secondary | ICD-10-CM

## 2024-12-18 DIAGNOSIS — I10 Essential (primary) hypertension: Secondary | ICD-10-CM | POA: Diagnosis not present

## 2024-12-18 DIAGNOSIS — Z170 Estrogen receptor positive status [ER+]: Secondary | ICD-10-CM | POA: Diagnosis not present

## 2024-12-18 DIAGNOSIS — M51362 Other intervertebral disc degeneration, lumbar region with discogenic back pain and lower extremity pain: Secondary | ICD-10-CM | POA: Diagnosis not present

## 2024-12-18 DIAGNOSIS — C50812 Malignant neoplasm of overlapping sites of left female breast: Secondary | ICD-10-CM | POA: Diagnosis not present

## 2024-12-18 NOTE — Progress Notes (Unsigned)
 "  Subjective:   Patient ID: Audrey Fischer, female    DOB: [birth year], 69 y.o.   MRN: [id number]  Discussed the use of AI scribe software for clinical note transcription with the patient, who gave verbal consent to proceed.  History of Present Illness Audrey Fischer is a 69 year old female with hypertension who presents with lower back pain and swelling after starting lisinopril.  Lower back pain began suddenly on December 15th after starting lisinopril, prescribed by her nephrologist to delay kidney issues. The pain was severe, located in the lower back, and disrupted her sleep and comfort. She also experienced right-sided foot discomfort while driving and was unable to bear weight on her right foot.  She began using compresses and visiting a chiropractor, which provided some relief. She used hydrocodone  and extra strength Tylenol  for pain management, reserving hydrocodone  for severe pain. Despite these measures, she experienced significant pain throughout the holiday season, affecting her mobility and causing a noticeable limp.  She noticed swelling in her feet, which she associated with lisinopril. After discontinuing lisinopril and resuming atenolol , she reported improvement in her symptoms. Her symptoms improved on days she accidentally skipped the medication.  She is physically active, walking three to five miles four times a week before this back pain started.   Review of Systems  Constitutional:  Positive for activity change. Negative for appetite change, chills, fatigue, fever and unexpected weight change.  HENT: Negative.    Eyes: Negative.   Respiratory: Negative.  Negative for cough, chest tightness and shortness of breath.   Cardiovascular: Negative.  Negative for chest pain, palpitations and leg swelling.  Gastrointestinal: Negative.  Negative for abdominal distention, abdominal pain, constipation, diarrhea, nausea and vomiting.  Musculoskeletal:  Positive for arthralgias, back  pain and myalgias. Negative for gait problem and joint swelling.  Skin: Negative.   Neurological: Negative.   Psychiatric/Behavioral: Negative.      Objective:  Physical Exam Constitutional:      Appearance: She is well-developed.  HENT:     Head: Normocephalic and atraumatic.  Cardiovascular:     Rate and Rhythm: Normal rate and regular rhythm.  Pulmonary:     Effort: Pulmonary effort is normal. No respiratory distress.     Breath sounds: Normal breath sounds. No wheezing or rales.  Abdominal:     General: Bowel sounds are normal. There is no distension.     Palpations: Abdomen is soft.     Tenderness: There is no abdominal tenderness.  Musculoskeletal:     Cervical back: Normal range of motion.  Skin:    General: Skin is warm and dry.  Neurological:     Mental Status: She is alert and oriented to person, place, and time.     Coordination: Coordination normal.     Vitals:   12/18/24 1612  BP: 122/80  Pulse: 61  Temp: (!) 97.5 F (36.4 C)  TempSrc: Oral  SpO2: 97%  Weight: 201 lb (91.2 kg)  Height: 5' 8 (1.727 m)    Assessment and Plan Assessment & Plan Lumbar radiculopathy and low back pain   Acute severe low back pain and right-sided radiculopathy likely resulted from lisinopril, with improvement noted after its discontinuation. Symptoms do not align with arthritis.  Discontinued lisinopril and resumed atenolol  for blood pressure management. Ordered labs to assess kidney function. Continue chiropractic care for symptom management.  Essential hypertension BP at goal on her prior atenolol  and she has discontinued lisinopril about 1 week ago. She is  getting labs within 1 week we will follow results.   CKD stage 3b Getting labs with nephrology in the next week due to discontinuation of lisinopril. She may need alternate medication. BP at goal today and diabetes at goal.   Infiltrating ductal carcinoma left breast She is a breast cancer survivor and is up to date  with mammograms. She is still taking arimidex . Continue regular mammogram screenings.   "

## 2024-12-19 ENCOUNTER — Encounter: Payer: Self-pay | Admitting: Internal Medicine

## 2024-12-19 DIAGNOSIS — N1832 Chronic kidney disease, stage 3b: Secondary | ICD-10-CM | POA: Insufficient documentation
